# Patient Record
Sex: Female | Born: 1970 | Race: White | Hispanic: No | Marital: Married | Smoking: Former smoker
Health system: Southern US, Community
[De-identification: ages and names within clinical notes are randomized; demographics above are authoritative.]

## PROBLEM LIST (undated history)

## (undated) DIAGNOSIS — Z87442 Personal history of urinary calculi: Secondary | ICD-10-CM

## (undated) DIAGNOSIS — I10 Essential (primary) hypertension: Secondary | ICD-10-CM

## (undated) DIAGNOSIS — N840 Polyp of corpus uteri: Secondary | ICD-10-CM

## (undated) DIAGNOSIS — N189 Chronic kidney disease, unspecified: Secondary | ICD-10-CM

## (undated) DIAGNOSIS — Z86018 Personal history of other benign neoplasm: Secondary | ICD-10-CM

## (undated) DIAGNOSIS — R319 Hematuria, unspecified: Secondary | ICD-10-CM

## (undated) DIAGNOSIS — K219 Gastro-esophageal reflux disease without esophagitis: Secondary | ICD-10-CM

## (undated) DIAGNOSIS — D649 Anemia, unspecified: Secondary | ICD-10-CM

## (undated) DIAGNOSIS — Z9289 Personal history of other medical treatment: Secondary | ICD-10-CM

## (undated) HISTORY — DX: Personal history of other benign neoplasm: Z86.018

## (undated) HISTORY — DX: Personal history of other medical treatment: Z92.89

## (undated) HISTORY — DX: Essential (primary) hypertension: I10

## (undated) HISTORY — DX: Hematuria, unspecified: R31.9

## (undated) HISTORY — PX: ABDOMINAL HYSTERECTOMY: SHX81

## (undated) HISTORY — PX: KIDNEY STONE SURGERY: SHX686

## (undated) HISTORY — DX: Polyp of corpus uteri: N84.0

---

## 2011-12-11 LAB — HM PAP SMEAR

## 2013-05-12 ENCOUNTER — Ambulatory Visit: Payer: Self-pay | Admitting: Family Medicine

## 2013-08-12 ENCOUNTER — Telehealth: Payer: Self-pay | Admitting: Diagnostic Neuroimaging

## 2013-08-12 NOTE — Telephone Encounter (Signed)
Please have her complete the studies and then come in.-LL

## 2013-08-12 NOTE — Telephone Encounter (Signed)
I called to get an update on patient's present symptoms. Patient reports present symptoms constant tingling and weakness of right fingers, hand, arm. She also has a sensation like she has held her arms over her head for a long time and blood has drained from hands, wrists and up arms. Finally, she has increased weakness.  She would like to have EMG/NCS. Do you want to see this patient first or have he complete the studies that were recommended before and then come in? Please advise.

## 2013-08-13 ENCOUNTER — Other Ambulatory Visit: Payer: Self-pay | Admitting: Nurse Practitioner

## 2013-08-13 DIAGNOSIS — R29898 Other symptoms and signs involving the musculoskeletal system: Secondary | ICD-10-CM

## 2013-08-13 DIAGNOSIS — R200 Anesthesia of skin: Secondary | ICD-10-CM

## 2013-08-13 NOTE — Telephone Encounter (Signed)
I entered order for NCV/EMG. Thanks, LL

## 2013-08-13 NOTE — Telephone Encounter (Signed)
Please, reorder EMG/NCS.  Thank you.

## 2013-08-13 NOTE — Telephone Encounter (Signed)
I called patient and let her know we will have her do the EMG/NCS and then see Heide Guile, NP. We scheduled follow up appointment. Patient will hear from scheduler to set up studies. Should she not hear in the next week, please call back and I will speak with her directly.

## 2013-09-07 ENCOUNTER — Encounter (INDEPENDENT_AMBULATORY_CARE_PROVIDER_SITE_OTHER): Payer: Self-pay

## 2013-09-07 ENCOUNTER — Emergency Department: Payer: Self-pay | Admitting: Emergency Medicine

## 2013-09-07 ENCOUNTER — Ambulatory Visit (INDEPENDENT_AMBULATORY_CARE_PROVIDER_SITE_OTHER): Payer: BC Managed Care – PPO | Admitting: Diagnostic Neuroimaging

## 2013-09-07 DIAGNOSIS — R209 Unspecified disturbances of skin sensation: Secondary | ICD-10-CM

## 2013-09-07 DIAGNOSIS — R200 Anesthesia of skin: Secondary | ICD-10-CM

## 2013-09-07 DIAGNOSIS — Z0289 Encounter for other administrative examinations: Secondary | ICD-10-CM

## 2013-09-07 DIAGNOSIS — M792 Neuralgia and neuritis, unspecified: Secondary | ICD-10-CM

## 2013-09-07 DIAGNOSIS — R29898 Other symptoms and signs involving the musculoskeletal system: Secondary | ICD-10-CM

## 2013-09-07 LAB — COMPREHENSIVE METABOLIC PANEL
Alkaline Phosphatase: 69 U/L (ref 50–136)
Anion Gap: 6 — ABNORMAL LOW (ref 7–16)
BUN: 25 mg/dL — ABNORMAL HIGH (ref 7–18)
Bilirubin,Total: 0.3 mg/dL (ref 0.2–1.0)
Calcium, Total: 9.1 mg/dL (ref 8.5–10.1)
Co2: 26 mmol/L (ref 21–32)
Creatinine: 0.94 mg/dL (ref 0.60–1.30)
EGFR (Non-African Amer.): >60
Glucose: 95 mg/dL (ref 65–99)
Osmolality: 282 (ref 275–301)
Potassium: 4.1 mmol/L (ref 3.5–5.1)
SGOT(AST): 23 U/L (ref 15–37)
Sodium: 139 mmol/L (ref 136–145)

## 2013-09-07 LAB — CBC
MCHC: 33.8 g/dL (ref 32.0–36.0)
Platelet: 266 10*3/uL (ref 150–440)
RDW: 13.2 % (ref 11.5–14.5)
WBC: 8.3 10*3/uL (ref 3.6–11.0)

## 2013-09-07 LAB — CK TOTAL AND CKMB (NOT AT ARMC): CK, Total: 74 U/L (ref 21–215)

## 2013-09-07 NOTE — Procedures (Signed)
   [facility]  NCS (NERVE CONDUCTION STUDY) WITH EMG (ELECTROMYOGRAPHY) REPORT   STUDY DATE: 09/07/13 PATIENT NAME: Sheryl Carr DOB: 09-Sep-1971 MRN: [id number]  ORDERING CLINICIAN: Joycelyn Schmid, MD   TECHNOLOGIST: Gearldine Shown ELECTROMYOGRAPHER: Glenford Bayley. Cashawn Yanko, MD  CLINICAL INFORMATION: 42 year old female with right arm pain and numbness.  FINDINGS: NERVE CONDUCTION STUDY: Bilateral median and ulnar motor responses have normal distal latencies, amplitudes, conduction velocities and F-wave latencies. Bilateral median and ulnar sensory responses are normal.  NEEDLE ELECTROMYOGRAPHY: Needle examination of selected muscles of the right upper extremity (deltoid, biceps, triceps, flexor carpi radialis, first dorsal interosseous) and right C6-7 and right C7-T1 paraspinal muscles is normal. No abnormal spontaneous activity at rest and normal motor unit recruitment on exertion.  IMPRESSION:  This is a normal study. No electrodiagnostic evidence of large fiber neuropathy or right cervical radiculopathy at this time.   INTERPRETING PHYSICIAN:  Suanne Marker, MD Certified in Neurology, Neurophysiology and Neuroimaging  [facility] [address], [address] [place], [place] [address] [phone]

## 2013-09-09 NOTE — Progress Notes (Signed)
Quick Note:  Spoke to patient and relayed normal NCV/EMG, per Larita Fife. ______

## 2013-09-24 ENCOUNTER — Encounter: Payer: Self-pay | Admitting: Nurse Practitioner

## 2013-09-24 DIAGNOSIS — R200 Anesthesia of skin: Secondary | ICD-10-CM | POA: Insufficient documentation

## 2013-09-24 DIAGNOSIS — M79609 Pain in unspecified limb: Secondary | ICD-10-CM | POA: Insufficient documentation

## 2013-10-02 ENCOUNTER — Ambulatory Visit: Payer: Self-pay | Admitting: Nurse Practitioner

## 2013-12-28 ENCOUNTER — Ambulatory Visit: Payer: Self-pay | Admitting: Nurse Practitioner

## 2014-01-07 ENCOUNTER — Other Ambulatory Visit: Payer: BC Managed Care – PPO

## 2014-01-20 ENCOUNTER — Ambulatory Visit: Payer: Self-pay | Admitting: Nurse Practitioner

## 2014-02-10 ENCOUNTER — Ambulatory Visit (INDEPENDENT_AMBULATORY_CARE_PROVIDER_SITE_OTHER): Payer: No Typology Code available for payment source

## 2014-02-10 ENCOUNTER — Encounter (INDEPENDENT_AMBULATORY_CARE_PROVIDER_SITE_OTHER): Payer: Self-pay

## 2014-02-10 DIAGNOSIS — R202 Paresthesia of skin: Principal | ICD-10-CM

## 2014-02-10 DIAGNOSIS — R209 Unspecified disturbances of skin sensation: Secondary | ICD-10-CM

## 2014-02-10 DIAGNOSIS — R200 Anesthesia of skin: Secondary | ICD-10-CM

## 2014-02-10 DIAGNOSIS — IMO0002 Reserved for concepts with insufficient information to code with codable children: Secondary | ICD-10-CM

## 2014-02-10 DIAGNOSIS — M792 Neuralgia and neuritis, unspecified: Secondary | ICD-10-CM

## 2014-02-10 MED ORDER — GADOPENTETATE DIMEGLUMINE 469.01 MG/ML IV SOLN: 13.0000 mL | Freq: Once | INTRAVENOUS | Status: AC | PRN

## 2014-02-17 ENCOUNTER — Encounter: Payer: Self-pay | Admitting: Nurse Practitioner

## 2014-02-17 ENCOUNTER — Ambulatory Visit (INDEPENDENT_AMBULATORY_CARE_PROVIDER_SITE_OTHER): Payer: No Typology Code available for payment source | Admitting: Nurse Practitioner

## 2014-02-17 VITALS — BP 114/88 | HR 68 | Ht 68.0 in | Wt 142.0 lb

## 2014-02-17 DIAGNOSIS — IMO0002 Reserved for concepts with insufficient information to code with codable children: Secondary | ICD-10-CM

## 2014-02-17 DIAGNOSIS — R29898 Other symptoms and signs involving the musculoskeletal system: Secondary | ICD-10-CM

## 2014-02-17 DIAGNOSIS — M792 Neuralgia and neuritis, unspecified: Secondary | ICD-10-CM

## 2014-02-17 DIAGNOSIS — M79609 Pain in unspecified limb: Secondary | ICD-10-CM

## 2014-02-17 DIAGNOSIS — R202 Paresthesia of skin: Secondary | ICD-10-CM

## 2014-02-17 DIAGNOSIS — R200 Anesthesia of skin: Secondary | ICD-10-CM

## 2014-02-17 DIAGNOSIS — R209 Unspecified disturbances of skin sensation: Secondary | ICD-10-CM

## 2014-02-17 NOTE — Progress Notes (Signed)
PATIENT: Sheryl Carr DOB: November 22, 1971  REASON FOR VISIT: follow up for numbness in right hand, pain in wrist HISTORY FROM: patient  HISTORY OF PRESENT ILLNESS: Complaint: Right hand numbness, pain   HPI: 43 year old right-handed female with hypertension, here for evaluation of right hand numbness and pain.  For past 15 years, patient has had intermittent right hand, index and middle finger, numbness and pain. She feels some sharp shooting pains in between her MCP joints. Symptoms aggravated by certain activities such as lifting her arms overhead, using a broom, sleeping with her hand closed fist. She was evaluated 12 years ago by rheumatology, possibly EMG nerve conduction study, and also vascular studies. Apparently all these testing were unremarkable at that time. She tried wrist splints which seem to aggravate her symptoms. Now symptoms progressing. sometimes dropping things.  02/17/14 (LL):  Patient returns for test results.  Symptoms have been constant.  MRI brain shows small areas of non-specific gliosis, MRI cervical spine normal and NCV/EMG normal.  Review of Systems  Out of a complete 14 system review, the patient complains of only the following symptoms, and all other reviewed systems are negative.  Neurological: Numbness   Weakness     Moles   Musculoskeletal: Joint pain, back pain  Allergy/Immunology: Allergies      ALLERGIES: Allergies  Allergen Reactions  . Amoxicillin     HOME MEDICATIONS: Outpatient Prescriptions Prior to Visit  Medication Sig Dispense Refill  . lisinopril (PRINIVIL,ZESTRIL) 5 MG tablet Take 5 mg by mouth daily.      Marland Kitchen azithromycin (ZITHROMAX) 250 MG tablet Take 250 mg by mouth.       No facility-administered medications prior to visit.     PHYSICAL EXAM  Filed Vitals:   02/17/14 1122  BP: 114/88  Pulse: 68  Height: 5\' 8"  (1.727 m)  Weight: 142 lb (64.411 kg)   Body mass index is 21.6 kg/(m^2).  Physical Exam  General: Patient  is awake, alert and in no acute distress.  Well developed and groomed. Musculoskeletal: POSITIVE PHALENS AND TINELS IN RIGHT HAND/INDEX FINGER.  Neurologic Exam  Mental Status: Awake, alert. Language is fluent and comprehension intact. Cranial Nerves:  Pupils are equal and reactive to light.  Visual fields are full to confrontation.  Conjugate eye movements are full and symmetric.  Facial sensation and strength are symmetric.  Hearing is intact.   Motor: Normal bulk and tone.  Full strength in the upper and lower extremities.   Sensory: Intact and symmetric to light touch, pinprick, temperature, vibration; EXCEPT DECR PP IN RIGHT HAND, DIGITS 2, 3.  Coordination: No ataxia or dysmetria on finger-nose or rapid alternating movement testing. Gait and Station: Narrow based gait. Reflexes: Deep tendon reflexes in the upper and lower extremity are present and symmetric.    ASSESSMENT AND PLAN 43 y.o. year old female  has a past medical history of HTN (hypertension) here with pain and numbness in right wrist and digits 2, 3.  MRI brain shows small areas of non-specific gliosis, MRI cervical spine normal and NCV/EMG normal.  Likely mild carpel tunnel syndrome.  Ddx: carpal tunnel syndrome vs. musculoskeletal  PLAN: 1. Recommend wrist splint at night to right wrist. 2. Use ergonomic wrist supports/aid while typing/work. 3. If condition worsens, referral to ortho/hand specialist 4. Follow up here as needed.  Philmore Pali, MSN, NP-C 02/17/2014, 11:44 AM [facility] [address] [place], [place] [address] [phone]  Note: This document was prepared with  digital dictation and possible smart Company secretary. Any transcriptional errors that result from this process are unintentional.

## 2014-02-19 NOTE — Progress Notes (Signed)
I reviewed note and agree with plan.   Penni Bombard, MD 4/48/1856, 3:14 PM Certified in Neurology, Neurophysiology and Neuroimaging  [facility] [address] [place], [place] [address] [phone]

## 2014-05-19 ENCOUNTER — Ambulatory Visit: Payer: Self-pay | Admitting: Family Medicine

## 2014-12-10 DIAGNOSIS — N840 Polyp of corpus uteri: Secondary | ICD-10-CM

## 2014-12-10 HISTORY — DX: Polyp of corpus uteri: N84.0

## 2015-03-15 ENCOUNTER — Ambulatory Visit
Admit: 2015-03-15 | Disposition: A | Discharge status: Home / Self Care | Payer: Self-pay | Attending: Family Medicine | Admitting: Family Medicine

## 2015-03-15 LAB — HM MAMMOGRAPHY

## 2015-05-11 DIAGNOSIS — Z9289 Personal history of other medical treatment: Secondary | ICD-10-CM

## 2015-05-11 HISTORY — DX: Personal history of other medical treatment: Z92.89

## 2015-05-23 ENCOUNTER — Encounter: Payer: Self-pay | Admitting: Family Medicine

## 2015-05-23 ENCOUNTER — Ambulatory Visit (INDEPENDENT_AMBULATORY_CARE_PROVIDER_SITE_OTHER): Payer: No Typology Code available for payment source | Admitting: Family Medicine

## 2015-05-23 VITALS — BP 133/87 | HR 81 | Temp 98.3°F | Resp 16 | Ht 68.0 in | Wt 148.6 lb

## 2015-05-23 DIAGNOSIS — J020 Streptococcal pharyngitis: Secondary | ICD-10-CM

## 2015-05-23 DIAGNOSIS — E7800 Pure hypercholesterolemia, unspecified: Secondary | ICD-10-CM | POA: Insufficient documentation

## 2015-05-23 DIAGNOSIS — L989 Disorder of the skin and subcutaneous tissue, unspecified: Secondary | ICD-10-CM | POA: Insufficient documentation

## 2015-05-23 DIAGNOSIS — L239 Allergic contact dermatitis, unspecified cause: Secondary | ICD-10-CM | POA: Insufficient documentation

## 2015-05-23 DIAGNOSIS — R109 Unspecified abdominal pain: Secondary | ICD-10-CM | POA: Insufficient documentation

## 2015-05-23 DIAGNOSIS — F419 Anxiety disorder, unspecified: Secondary | ICD-10-CM | POA: Insufficient documentation

## 2015-05-23 DIAGNOSIS — G629 Polyneuropathy, unspecified: Secondary | ICD-10-CM | POA: Insufficient documentation

## 2015-05-23 DIAGNOSIS — IMO0001 Reserved for inherently not codable concepts without codable children: Secondary | ICD-10-CM | POA: Insufficient documentation

## 2015-05-23 DIAGNOSIS — R5383 Other fatigue: Secondary | ICD-10-CM | POA: Insufficient documentation

## 2015-05-23 DIAGNOSIS — J029 Acute pharyngitis, unspecified: Secondary | ICD-10-CM

## 2015-05-23 DIAGNOSIS — M62838 Other muscle spasm: Secondary | ICD-10-CM | POA: Insufficient documentation

## 2015-05-23 DIAGNOSIS — N63 Unspecified lump in unspecified breast: Secondary | ICD-10-CM | POA: Insufficient documentation

## 2015-05-23 LAB — POCT RAPID STREP A (OFFICE): RAPID STREP A SCREEN: POSITIVE — AB

## 2015-05-23 MED ORDER — CEPHALEXIN 500 MG PO CAPS
500.0000 mg | ORAL_CAPSULE | Freq: Four times a day (QID) | ORAL | Status: DC
Start: 2015-05-23 — End: 2015-08-03

## 2015-05-23 NOTE — Patient Instructions (Signed)
Gargle with mild saline gargles 3-4 times a day.  May take Tylenol or Ibuprofen for aches/fever.

## 2015-05-23 NOTE — Progress Notes (Signed)
Name: Sheryl Carr   MRN: [id number]    DOB: [birth year]   Date:05/23/2015       Progress Note  Subjective  Chief Complaint  Chief Complaint  Patient presents with  . Sore Throat    4 days    white pocket in throat    HPI  Sore throat x4 days.  Cpnsistent  No fever], but has had some chlls and  Aches and malaise.  No cough. Mild congestion  Past Medical History  Diagnosis Date  . HTN (hypertension)     History  Substance Use Topics  . Smoking status: Former Smoker    Quit date: 09/24/1990  . Smokeless tobacco: Never Used  . Alcohol Use: Yes     Comment: social     Current outpatient prescriptions:  .  cephALEXin (KEFLEX) 500 MG capsule, Take 1 capsule (500 mg total) by mouth 4 (four) times daily., Disp: 40 capsule, Rfl: 0 .  ibuprofen (ADVIL,MOTRIN) 200 MG tablet, Take by mouth., Disp: , Rfl:  .  lisinopril (PRINIVIL,ZESTRIL) 5 MG tablet, Take 5 mg by mouth daily., Disp: , Rfl:   Allergies  Allergen Reactions  . Amoxicillin   . Pollen Extract     itchy eyes, dull headaches, runny nose  . Penicillins Rash    Review of Systems  Constitutional: Positive for chills and malaise/fatigue. Negative for fever.  HENT: Positive for sore throat.   Respiratory: Negative.  Negative for cough, sputum production and wheezing.   Cardiovascular: Negative.  Negative for chest pain, orthopnea and leg swelling.  Gastrointestinal: Negative.  Negative for nausea, abdominal pain and diarrhea.  Skin: Negative for rash.  Neurological: Positive for weakness. Negative for headaches.     Objective  Filed Vitals:   05/23/15 1314  BP: 133/87  Pulse: 81  Temp: 98.3 F (36.8 C)  Resp: 16  Height: 5\' 8"  (1.727 m)  Weight: 148 lb 9.6 oz (67.405 kg)     Physical Exam  Constitutional: She appears distressed (mild).  HENT:  Right Ear: External ear and ear canal normal.  Left Ear: External ear and ear canal normal.  Nose: Nose normal.  Mouth/Throat: Posterior oropharyngeal  erythema (bright red post-pharynx esp around tonsils.) present.  Neck: Normal range of motion. Neck supple. No thyromegaly present.  Cardiovascular: Normal rate, regular rhythm, normal heart sounds and intact distal pulses.  Exam reveals no gallop and no friction rub.   No murmur heard. Pulmonary/Chest: Effort normal and breath sounds normal.  Abdominal: Soft. Bowel sounds are normal. There is no tenderness.  Lymphadenopathy:    She has cervical adenopathy.       Right cervical: No superficial cervical adenopathy present.      Left cervical: Superficial cervical adenopathy present.  Vitals reviewed.     Recent Results (from the past 2160 hour(s))  [facility] MAMMOGRAPHY     Status: None   Collection Time: 03/15/15 12:00 AM  Result Value Ref Range   [facility] Mammogram WNL   POCT rapid strep A     Status: Abnormal   Collection Time: 05/23/15  1:31 PM  Result Value Ref Range   Rapid Strep A Screen Positive (A) Negative     Assessment & Plan  1. Sore throat  - POCT rapid strep A- Positive- cephALEXin (KEFLEX) 500 MG capsule; Take 1 capsule (500 mg total) by mouth 4 (four) times daily.  Dispense: 40 capsule; Refill: 0

## 2015-06-28 ENCOUNTER — Encounter: Payer: Self-pay | Admitting: Family Medicine

## 2015-07-02 ENCOUNTER — Encounter: Payer: Self-pay | Admitting: Emergency Medicine

## 2015-07-02 DIAGNOSIS — Z87891 Personal history of nicotine dependence: Secondary | ICD-10-CM | POA: Diagnosis not present

## 2015-07-02 DIAGNOSIS — R531 Weakness: Secondary | ICD-10-CM | POA: Insufficient documentation

## 2015-07-02 DIAGNOSIS — R5383 Other fatigue: Secondary | ICD-10-CM | POA: Diagnosis present

## 2015-07-02 DIAGNOSIS — Z79899 Other long term (current) drug therapy: Secondary | ICD-10-CM | POA: Diagnosis not present

## 2015-07-02 DIAGNOSIS — Z3202 Encounter for pregnancy test, result negative: Secondary | ICD-10-CM | POA: Insufficient documentation

## 2015-07-02 DIAGNOSIS — I10 Essential (primary) hypertension: Secondary | ICD-10-CM | POA: Diagnosis not present

## 2015-07-02 DIAGNOSIS — Z880 Allergy status to penicillin: Secondary | ICD-10-CM | POA: Diagnosis not present

## 2015-07-02 NOTE — ED Notes (Signed)
Pt presents to ER alert and in NAD. Pt states she was outside for 15 minutes today and has felt fatigued since. Pt alert and ambulatory, clear speech.

## 2015-07-03 ENCOUNTER — Emergency Department
Admission: EM | Admit: 2015-07-03 | Discharge: 2015-07-03 | Disposition: A | Discharge status: Home / Self Care | Payer: No Typology Code available for payment source | Attending: Emergency Medicine | Admitting: Emergency Medicine

## 2015-07-03 DIAGNOSIS — R531 Weakness: Secondary | ICD-10-CM

## 2015-07-03 LAB — BASIC METABOLIC PANEL
Anion gap: 6 (ref 5–15)
BUN: 13 mg/dL (ref 6–20)
CALCIUM: 9.6 mg/dL (ref 8.9–10.3)
CO2: 30 mmol/L (ref 22–32)
Chloride: 102 mmol/L (ref 101–111)
Creatinine, Ser: 0.72 mg/dL (ref 0.44–1.00)
GFR calc Af Amer: >60 mL/min (ref >60)
GFR calc non Af Amer: >60 mL/min (ref >60)
GLUCOSE: 97 mg/dL (ref 65–99)
Potassium: 3.7 mmol/L (ref 3.5–5.1)
SODIUM: 138 mmol/L (ref 135–145)

## 2015-07-03 LAB — URINALYSIS COMPLETE WITH MICROSCOPIC (ARMC ONLY)
Bacteria, UA: NONE SEEN
Bilirubin Urine: NEGATIVE
Glucose, UA: NEGATIVE mg/dL
KETONES UR: NEGATIVE mg/dL
Leukocytes, UA: NEGATIVE
Nitrite: NEGATIVE
PH: 8 (ref 5.0–8.0)
PROTEIN: NEGATIVE mg/dL
Specific Gravity, Urine: 1.008 (ref 1.005–1.030)
WBC, UA: NONE SEEN WBC/hpf (ref 0–5)

## 2015-07-03 LAB — CBC
HCT: 32 % — ABNORMAL LOW (ref 35.0–47.0)
Hemoglobin: 9.8 g/dL — ABNORMAL LOW (ref 12.0–16.0)
MCH: 20.8 pg — AB (ref 26.0–34.0)
MCHC: 30.5 g/dL — AB (ref 32.0–36.0)
MCV: 68.2 fL — ABNORMAL LOW (ref 80.0–100.0)
Platelets: 371 10*3/uL (ref 150–440)
RBC: 4.69 MIL/uL (ref 3.80–5.20)
RDW: 16.4 % — ABNORMAL HIGH (ref 11.5–14.5)
WBC: 5.7 10*3/uL (ref 3.6–11.0)

## 2015-07-03 LAB — PREGNANCY, URINE: Preg Test, Ur: NEGATIVE

## 2015-07-03 NOTE — ED Notes (Signed)
Pt. Going home with family. 

## 2015-07-03 NOTE — Discharge Instructions (Signed)

## 2015-07-03 NOTE — ED Provider Notes (Signed)
[facility] Emergency Department Provider Note  Time seen: 2:00 AM  I have reviewed the triage vital signs and the nursing notes.   HISTORY  Chief Complaint Fatigue    HPI Sheryl Carr is a 44 y.o. female with a past medical history of hypertension who presents the emergency department with fatigue. According to the patient she was outside for approximately 30 minutes a day in the heat. She states she became very hot so she went inside. She took a cool shower to cool down, but ever since then she has felt extremely tired. Denies any headache, focal weakness or numbness. Denies abdominal pain, chest pain or shortness of breath. States the fatigue has continued all day so she came into the emergency department for evaluation.     Past Medical History  Diagnosis Date  . HTN (hypertension)     Patient Active Problem List   Diagnosis Date Noted  . Abdominal pain 05/23/2015  . Anxiety 05/23/2015  . Allergic contact dermatitis 05/23/2015  . Elevated LDL cholesterol level 05/23/2015  . Essential (primary) hypertension 05/23/2015  . Fatigue 05/23/2015  . Brash 05/23/2015  . Breast lump 05/23/2015  . Muscle spasms of neck 05/23/2015  . Neuropathy 05/23/2015  . Pharyngeal inflammation 05/23/2015  . Skin lesion 05/23/2015  . Strep pharyngitis 05/23/2015  . Pain in limb 09/24/2013  . Numbness 09/24/2013    Past Surgical History  Procedure Laterality Date  . None    . Cesarean section      Current Outpatient Rx  Name  Route  Sig  Dispense  Refill  . cephALEXin (KEFLEX) 500 MG capsule   Oral   Take 1 capsule (500 mg total) by mouth 4 (four) times daily.   40 capsule   0     Patient had rash to penicillin.  Has never taken C ...   . ibuprofen (ADVIL,MOTRIN) 200 MG tablet   Oral   Take by mouth.         Marland Kitchen lisinopril (PRINIVIL,ZESTRIL) 5 MG tablet   Oral   Take 5 mg by mouth daily.           Allergies Amoxicillin; Pollen extract;  and Penicillins  Family History  Problem Relation Age of Onset  . Diabetes Mellitus I Mother   . Diabetes Mellitus I Maternal Grandmother   . Prostate cancer Maternal Grandfather   . Diabetes Mellitus I Paternal Grandmother     Social History History  Substance Use Topics  . Smoking status: Former Smoker    Quit date: 09/24/1990  . Smokeless tobacco: Never Used  . Alcohol Use: Yes     Comment: social    Review of Systems Constitutional: Negative for fever Cardiovascular: Negative for chest pain. Respiratory: Negative for shortness of breath. Gastrointestinal: Negative for abdominal pain Genitourinary: Negative for dysuria. Neurological: Negative for headaches, focal weakness or numbness. 10-point ROS otherwise negative.  ____________________________________________   PHYSICAL EXAM:  VITAL SIGNS: ED Triage Vitals  Enc Vitals Group     BP 07/02/15 2355 185/115 mmHg     Pulse Rate 07/02/15 2354 84     Resp 07/02/15 2354 20     Temp 07/02/15 2354 98.1 F (36.7 C)     Temp Source 07/02/15 2354 Oral     SpO2 07/02/15 2354 100 %     Weight 07/02/15 2354 145 lb (65.772 kg)     Height 07/02/15 2354 5\' 8"  (1.727 m)     Head Cir --  Peak Flow --      Pain Score --      Pain Loc --      Pain Edu? --      Excl. in [place]? --     Constitutional: Alert and oriented. Well appearing and in no distress. Eyes: Normal exam, 2-3 mm PERRL bilaterally ENT   Head: Normocephalic and atraumatic.   Nose: No congestion/rhinnorhea.   Mouth/Throat: Mucous membranes are moist. Cardiovascular: Normal rate, regular rhythm. No murmur Respiratory: Normal respiratory effort without tachypnea nor retractions. Breath sounds are clear  Gastrointestinal: Soft and nontender. No distention.   Musculoskeletal: Nontender with normal range of motion in all extremities.  Neurologic:  Normal speech and language. No gross focal neurologic deficits are appreciated. Speech is normal. No  pronator drift, equal motor in all extremities 5/5. Skin:  Skin is warm, dry and intact.  Psychiatric: Mood and affect are normal. Speech and behavior are normal.   ____________________________________________    EKG  EKG reviewed and interpreted by myself shows normal sinus rhythm at 81 bpm, narrow QRS, normal axis, normal intervals, no ST changes noted. Overall normal EKG.  ____________________________________________     INITIAL IMPRESSION / ASSESSMENT AND PLAN / ED COURSE  Pertinent labs & imaging results that were available during my care of the patient were reviewed by me and considered in my medical decision making (see chart for details).  Patient presents the emergency department with fatigue. Patient's lab work appears largely within normal limits including a negative urinalysis. Patient's EKG appears well. Patient states this all began after being outside in the heat. I discussed with the patient increasing her fluids at home, obtaining plenty of rest, and following up with her primary care doctor on Monday if she continues to have any symptoms. I discussed with the patient return precautions including if she develops any chest pain, trouble breathing, or worsening fatigue. The patient is agreeable.  ____________________________________________   FINAL CLINICAL IMPRESSION(S) / ED DIAGNOSES  Generalized weakness   Harvest Dark, MD 07/03/15 [phone]

## 2015-07-03 NOTE — ED Notes (Signed)
Pt. States after 10 am yesterday felt nausea, fatgue and weakness.  Pt. Denies anyone in house with same symptoms.  Pt. Denies starting any new medications.  Pt. Denies any cold or flu like symptoms.  Pt. Denies any new stressors.

## 2015-08-03 ENCOUNTER — Ambulatory Visit (INDEPENDENT_AMBULATORY_CARE_PROVIDER_SITE_OTHER): Payer: No Typology Code available for payment source | Admitting: Family Medicine

## 2015-08-03 ENCOUNTER — Encounter: Payer: Self-pay | Admitting: Family Medicine

## 2015-08-03 ENCOUNTER — Other Ambulatory Visit: Payer: Self-pay

## 2015-08-03 VITALS — BP 143/83 | HR 71 | Temp 97.1°F | Resp 16 | Ht 67.0 in | Wt 147.0 lb

## 2015-08-03 DIAGNOSIS — Q519 Congenital malformation of uterus and cervix, unspecified: Secondary | ICD-10-CM

## 2015-08-03 DIAGNOSIS — Z0000 Encounter for general adult medical examination without abnormal findings: Secondary | ICD-10-CM

## 2015-08-03 DIAGNOSIS — R102 Pelvic and perineal pain: Secondary | ICD-10-CM

## 2015-08-03 DIAGNOSIS — K219 Gastro-esophageal reflux disease without esophagitis: Secondary | ICD-10-CM | POA: Diagnosis not present

## 2015-08-03 MED ORDER — OMEPRAZOLE 20 MG PO CPDR: 20.0000 mg | DELAYED_RELEASE_CAPSULE | Freq: Every day | ORAL | Status: DC

## 2015-08-03 NOTE — Patient Instructions (Signed)
CBC, CMP and Lipid panels were ordered and order sheet given to patient.    Pelvic Ultrasound will be ordered at [facility]  For evaluation of patient's periodic pelvic pain and abnormal uterine anatomy.  Patient will contact [facility] to arrange convenient sxcreening mammogram time.

## 2015-08-03 NOTE — Progress Notes (Signed)
Name: Brandon Scarbrough   MRN: [id number]    DOB: 11/10/1971   Date:08/03/2015       Progress Note  Subjective  Chief Complaint  Chief Complaint  Patient presents with  . Annual Exam    HPI  Here for annual female physical exam.  Last full mammogram screen was 2014.  Last Pap was 2014.  Has never had an abnormal pap.  Had a questionable  mass in R. Breast earlier this spring.  Mammogram and [place] were neg.  She can no longer feel the mass now.  Regular screening mammogram recommended for this summer.  Pt has hx of GERD.  Refluxes acid several days each week.  Has severe reflux when she drinks caffeine so has reduced this .  This has helped some, but still with some several days each week.  Has episodic several pelvic cramping that is relieved by urinating.  There is no pattern with this.  Not daily. No interference with sex.  No other UTI sx.  Urine cultures have been neg. No problem-specific assessment & plan notes found for this encounter.   Past Medical History  Diagnosis Date  . HTN (hypertension)   . H/O mammogram 05/11/2015    Past Surgical History  Procedure Laterality Date  . None    . Cesarean section      Family History  Problem Relation Age of Onset  . Diabetes Mellitus I Mother   . Diabetes Mellitus I Maternal Grandmother   . Prostate cancer Maternal Grandfather   . Diabetes Mellitus I Paternal Grandmother     Social History   Social History  . Marital Status: Married    Spouse Name: [name]  . Number of Children: 2  . Years of Education: college   Occupational History  . n/a   .  [place] History Main Topics  . Smoking status: Former Smoker    Quit date: 09/24/1990  . Smokeless tobacco: Never Used  . Alcohol Use: Yes     Comment: social  . Drug Use: No  . Sexual Activity: Yes    Birth Control/ Protection: Injection   Other Topics Concern  . Not on file   Social History Narrative   Patient is married [name]) and lives at home with  her husband and one child, one child is in college.   Patient is working full-time.   Patient has a college education.   Patient is right-handed.   Patient drinks 1-2 cups of coffee daily, soda and tea are occasionally, not daily.     Current outpatient prescriptions:  .  omeprazole (PRILOSEC) 20 MG capsule, Take 1 capsule (20 mg total) by mouth daily., Disp: 30 capsule, Rfl: 12  Allergies  Allergen Reactions  . Amoxicillin   . Pollen Extract     itchy eyes, dull headaches, runny nose  . Penicillins Rash     Review of Systems  Constitutional: Negative for fever, chills, weight loss and malaise/fatigue.  HENT: Negative for congestion, ear pain and hearing loss.   Eyes: Negative for blurred vision, double vision and pain.  Respiratory: Negative for cough, sputum production, shortness of breath and wheezing.   Cardiovascular: Negative for chest pain, palpitations, orthopnea, leg swelling and PND.  Gastrointestinal: Positive for heartburn and abdominal pain. Negative for nausea, vomiting, diarrhea, constipation and blood in stool.  Genitourinary: Positive for dysuria. Negative for urgency, frequency and hematuria.  Musculoskeletal: Negative for myalgias and joint pain.  Skin: Negative for itching and rash.  Neurological: Negative for dizziness, sensory change, focal weakness, weakness and headaches.  Psychiatric/Behavioral: Negative for depression. The patient is not nervous/anxious.       Objective  Filed Vitals:   08/03/15 1659  BP: 143/83  Pulse: 71  Temp: 97.1 F (36.2 C)  Resp: 16  Height: _0  (1.702 m)  Weight: 147 lb (66.679 kg)    Physical Exam  Constitutional: She is oriented to person, place, and time and well-developed, well-nourished, and in no distress. No distress.  HENT:  Head: Normocephalic and atraumatic.  Right Ear: External ear normal.  Left Ear: External ear normal.  Nose: Nose normal.  Mouth/Throat: Oropharynx is clear and moist.  Eyes: EOM  are normal. Pupils are equal, round, and reactive to light.  Fundoscopic exam:      The right eye shows no arteriolar narrowing, no AV nicking, no exudate and no hemorrhage.       The left eye shows no arteriolar narrowing, no AV nicking, no exudate and no hemorrhage.  Neck: Normal range of motion. Neck supple. Carotid bruit is not present. No thyroid mass and no thyromegaly present.  Cardiovascular: Normal rate, regular rhythm and intact distal pulses.  Exam reveals no gallop and no friction rub.   No murmur heard. Pulmonary/Chest: Effort normal and breath sounds normal. No respiratory distress. She has no wheezes. She has no rales. Right breast exhibits no inverted nipple, no mass, no nipple discharge, no skin change and no tenderness. Left breast exhibits no inverted nipple, no mass, no nipple discharge, no skin change and no tenderness. Breasts are symmetrical.  Abdominal: Soft. Bowel sounds are normal. She exhibits no distension and no mass. There is no tenderness.  Genitourinary:  Pelvic exam showed normal external genitalia.  Vag. Vault was wnl.  Uterus was extremely retroverted and relatively fixed in place with some asymetry to patient's L.  Cx located on top of uterus and difficult to visualize and hard to do adequate Pap smear.  No adnexal masses, but some tenderness to L. Adnexal area, but uterus more to this side and fixated.  Uterus itself was not tender.  Bladder was not tender.  Musculoskeletal: Normal range of motion. She exhibits no edema or tenderness.  Lymphadenopathy:    She has no cervical adenopathy.  Neurological: She is alert and oriented to person, place, and time. No cranial nerve deficit. Gait normal. Coordination normal.  Skin: Skin is warm and dry. No rash noted. No erythema. No pallor.  Psychiatric: Mood, memory, affect and judgment normal.  Vitals reviewed.      Recent Results (from the past 2160 hour(s))  POCT rapid strep A     Status: Abnormal   Collection  Time: 05/23/15  1:31 PM  Result Value Ref Range   Rapid Strep A Screen Positive (A) Negative  Basic metabolic panel     Status: None   Collection Time: 07/02/15 11:57 PM  Result Value Ref Range   Sodium 138 135 - 145 mmol/L   Potassium 3.7 3.5 - 5.1 mmol/L   Chloride 102 101 - 111 mmol/L   CO2 30 22 - 32 mmol/L   Glucose, Bld 97 65 - 99 mg/dL   BUN 13 6 - 20 mg/dL   Creatinine, Ser 0.72 0.44 - 1.00 mg/dL   Calcium 9.6 8.9 - 10.3 mg/dL   GFR calc non Af Amer >60 >60 mL/min   GFR calc Af Amer >60 >60 mL/min    Comment: (NOTE) The eGFR has been calculated using  the CKD EPI equation. This calculation has not been validated in all clinical situations. eGFR's persistently <60 mL/min signify possible Chronic Kidney Disease.    Anion gap 6 5 - 15  CBC     Status: Abnormal   Collection Time: 07/02/15 11:57 PM  Result Value Ref Range   WBC 5.7 3.6 - 11.0 K/uL   RBC 4.69 3.80 - 5.20 MIL/uL   Hemoglobin 9.8 (L) 12.0 - 16.0 g/dL   HCT 32.0 (L) 35.0 - 47.0 %   MCV 68.2 (L) 80.0 - 100.0 fL   MCH 20.8 (L) 26.0 - 34.0 pg   MCHC 30.5 (L) 32.0 - 36.0 g/dL   RDW 16.4 (H) 11.5 - 14.5 %   Platelets 371 150 - 440 K/uL  Urinalysis complete, with microscopic ([facility] only)     Status: Abnormal   Collection Time: 07/02/15 11:57 PM  Result Value Ref Range   Color, Urine STRAW (A) YELLOW   APPearance CLEAR (A) CLEAR   Glucose, UA NEGATIVE NEGATIVE mg/dL   Bilirubin Urine NEGATIVE NEGATIVE   Ketones, ur NEGATIVE NEGATIVE mg/dL   Specific Gravity, Urine 1.008 1.005 - 1.030   Hgb urine dipstick 1+ (A) NEGATIVE   pH 8.0 5.0 - 8.0   Protein, ur NEGATIVE NEGATIVE mg/dL   Nitrite NEGATIVE NEGATIVE   Leukocytes, UA NEGATIVE NEGATIVE   RBC / HPF 0-5 0 - 5 RBC/hpf   WBC, UA NONE SEEN 0 - 5 WBC/hpf   Bacteria, UA NONE SEEN NONE SEEN   Squamous Epithelial / LPF 0-5 (A) NONE SEEN   Mucous PRESENT   Pregnancy, urine     Status: None   Collection Time: 07/02/15 11:57 PM  Result Value Ref Range   Preg  Test, Ur NEGATIVE NEGATIVE     Assessment & Plan  Problem List Items Addressed This Visit      Digestive   GERD (gastroesophageal reflux disease)   Relevant Medications   omeprazole (PRILOSEC) 20 MG capsule     Other   Annual physical exam-female    Other Visit Diagnoses    Pelvic pain in female    -  Primary    Relevant Orders    US Pelvis Complete    Pap IG and HPV (high risk) DNA detection    Congenital abnormal shape of uterus           Meds ordered this encounter  Medications  . omeprazole (PRILOSEC) 20 MG capsule    Sig: Take 1 capsule (20 mg total) by mouth daily.    Dispense:  30 capsule    Refill:  12   1. Pelvic pain in female  - US Pelvis Complete; Future - Pap IG and HPV (high risk) DNA detection  2. Congenital abnormal shape of uterus   3. Annual physical exam-female   4. Gastroesophageal reflux disease without esophagitis  - omeprazole (PRILOSEC) 20 MG capsule; Take 1 capsule (20 mg total) by mouth daily.  Dispense: 30 capsule; Refill: 12

## 2015-08-04 ENCOUNTER — Other Ambulatory Visit: Payer: Self-pay | Admitting: Family Medicine

## 2015-08-04 ENCOUNTER — Telehealth: Payer: Self-pay | Admitting: *Deleted

## 2015-08-04 DIAGNOSIS — Z1231 Encounter for screening mammogram for malignant neoplasm of breast: Secondary | ICD-10-CM

## 2015-08-04 NOTE — Telephone Encounter (Signed)
Appt for [place] has been scheduled for [facility] 08/08/15. Pt to arrive at 2:45 with a full bladder for 3 Pm appt. [place] location.

## 2015-08-05 NOTE — Addendum Note (Signed)
Addended by: Theresia Majors A on: 08/05/2015 04:04 PM   Modules accepted: Miquel Dunn

## 2015-08-08 ENCOUNTER — Other Ambulatory Visit: Payer: Self-pay | Admitting: Family Medicine

## 2015-08-08 ENCOUNTER — Ambulatory Visit
Admission: RE | Admit: 2015-08-08 | Discharge: 2015-08-08 | Disposition: A | Discharge status: Home / Self Care | Payer: No Typology Code available for payment source | Source: Ambulatory Visit | Attending: Family Medicine | Admitting: Family Medicine

## 2015-08-08 DIAGNOSIS — D251 Intramural leiomyoma of uterus: Secondary | ICD-10-CM | POA: Insufficient documentation

## 2015-08-08 DIAGNOSIS — R102 Pelvic and perineal pain: Secondary | ICD-10-CM | POA: Insufficient documentation

## 2015-08-08 DIAGNOSIS — N840 Polyp of corpus uteri: Secondary | ICD-10-CM | POA: Diagnosis not present

## 2015-08-08 DIAGNOSIS — N8320 Unspecified ovarian cysts: Secondary | ICD-10-CM | POA: Diagnosis not present

## 2015-08-09 LAB — COMPREHENSIVE METABOLIC PANEL
A/G RATIO: 1.9 (ref 1.1–2.5)
ALT: 24 IU/L (ref 0–32)
AST: 18 IU/L (ref 0–40)
Albumin: 4.5 g/dL (ref 3.5–5.5)
Alkaline Phosphatase: 59 IU/L (ref 39–117)
BILIRUBIN TOTAL: 0.4 mg/dL (ref 0.0–1.2)
BUN/Creatinine Ratio: 18 (ref 9–23)
BUN: 10 mg/dL (ref 6–24)
CALCIUM: 9 mg/dL (ref 8.7–10.2)
CHLORIDE: 100 mmol/L (ref 97–108)
CO2: 24 mmol/L (ref 18–29)
Creatinine, Ser: 0.57 mg/dL (ref 0.57–1.00)
GFR, EST AFRICAN AMERICAN: 131 mL/min/{1.73_m2} (ref >59)
GFR, EST NON AFRICAN AMERICAN: 114 mL/min/{1.73_m2} (ref >59)
GLOBULIN, TOTAL: 2.4 g/dL (ref 1.5–4.5)
Glucose: 92 mg/dL (ref 65–99)
POTASSIUM: 4.2 mmol/L (ref 3.5–5.2)
SODIUM: 140 mmol/L (ref 134–144)
TOTAL PROTEIN: 6.9 g/dL (ref 6.0–8.5)

## 2015-08-09 LAB — CBC WITH DIFFERENTIAL/PLATELET
BASOS: 1 %
Basophils Absolute: 0 10*3/uL (ref 0.0–0.2)
EOS (ABSOLUTE): 0.1 10*3/uL (ref 0.0–0.4)
EOS: 2 %
HEMATOCRIT: 27.3 % — AB (ref 34.0–46.6)
Hemoglobin: 8.3 g/dL — ABNORMAL LOW (ref 11.1–15.9)
IMMATURE GRANULOCYTES: 0 %
Immature Grans (Abs): 0 10*3/uL (ref 0.0–0.1)
Lymphocytes Absolute: 1.4 10*3/uL (ref 0.7–3.1)
Lymphs: 29 %
MCH: 20.6 pg — AB (ref 26.6–33.0)
MCHC: 30.4 g/dL — ABNORMAL LOW (ref 31.5–35.7)
MCV: 68 fL — AB (ref 79–97)
Monocytes Absolute: 0.4 10*3/uL (ref 0.1–0.9)
Monocytes: 9 %
NEUTROS ABS: 3 10*3/uL (ref 1.4–7.0)
NEUTROS PCT: 59 %
PLATELETS: 375 10*3/uL (ref 150–379)
RBC: 4.02 x10E6/uL (ref 3.77–5.28)
RDW: 16.4 % — AB (ref 12.3–15.4)
WBC: 5 10*3/uL (ref 3.4–10.8)

## 2015-08-09 LAB — LIPID PANEL
CHOL/HDL RATIO: 2.7 ratio (ref 0.0–4.4)
Cholesterol, Total: 181 mg/dL (ref 100–199)
HDL: 66 mg/dL (ref >39)
LDL Calculated: 103 mg/dL — ABNORMAL HIGH (ref 0–99)
Triglycerides: 58 mg/dL (ref 0–149)
VLDL Cholesterol Cal: 12 mg/dL (ref 5–40)

## 2015-08-10 ENCOUNTER — Ambulatory Visit
Admission: RE | Admit: 2015-08-10 | Discharge: 2015-08-10 | Disposition: A | Discharge status: Home / Self Care | Payer: No Typology Code available for payment source | Source: Ambulatory Visit | Attending: Family Medicine | Admitting: Family Medicine

## 2015-08-10 DIAGNOSIS — Z1231 Encounter for screening mammogram for malignant neoplasm of breast: Secondary | ICD-10-CM | POA: Diagnosis present

## 2015-08-10 LAB — IRON AND TIBC
Iron Saturation: 3 % — CL (ref 15–55)
Iron: 13 ug/dL — ABNORMAL LOW (ref 27–159)
TIBC: 432 ug/dL (ref 250–450)
UIBC: 419 ug/dL (ref 131–425)

## 2015-08-10 LAB — FERRITIN: Ferritin: 2 ng/mL — ABNORMAL LOW (ref 15–150)

## 2015-08-11 ENCOUNTER — Telehealth: Payer: Self-pay | Admitting: *Deleted

## 2015-08-11 NOTE — Telephone Encounter (Signed)
Pt r/t call for mammogram and additional lab results. Results have been given.

## 2015-08-16 ENCOUNTER — Telehealth: Payer: Self-pay

## 2015-08-16 ENCOUNTER — Other Ambulatory Visit: Payer: Self-pay

## 2015-08-16 DIAGNOSIS — N926 Irregular menstruation, unspecified: Secondary | ICD-10-CM

## 2015-08-16 LAB — POC HEMOCCULT BLD/STL (HOME/3-CARD/SCREEN)
Card #3 Fecal Occult Blood, POC: NEGATIVE
FECAL OCCULT BLD: NEGATIVE
FECAL OCCULT BLD: NEGATIVE

## 2015-08-16 NOTE — Telephone Encounter (Signed)
Result neg x 3 for hemmocult. Advised to kep GYN and keep on Iron suppli. [facility]

## 2015-08-17 ENCOUNTER — Telehealth: Payer: Self-pay

## 2015-08-17 DIAGNOSIS — R102 Pelvic and perineal pain: Secondary | ICD-10-CM

## 2015-08-17 NOTE — Telephone Encounter (Signed)
GYN ref never entered so I put in system and will see when they call her.[facility]

## 2015-08-19 LAB — PAP IG AND HPV HIGH-RISK: PAP SMEAR COMMENT: 0

## 2015-08-19 LAB — SPECIMEN STATUS REPORT

## 2015-08-22 ENCOUNTER — Telehealth: Payer: Self-pay | Admitting: *Deleted

## 2015-08-22 NOTE — Telephone Encounter (Signed)
Pt aware of pap results and # given for [facility] for referral.[place]

## 2015-08-22 NOTE — Progress Notes (Signed)
Left message/JH 

## 2015-08-31 LAB — SPECIMEN STATUS REPORT

## 2015-09-05 ENCOUNTER — Other Ambulatory Visit: Payer: Self-pay

## 2015-09-05 DIAGNOSIS — E611 Iron deficiency: Secondary | ICD-10-CM

## 2015-09-10 LAB — IRON: IRON: 100 ug/dL (ref 27–159)

## 2015-09-27 ENCOUNTER — Encounter: Payer: No Typology Code available for payment source | Admitting: Obstetrics and Gynecology

## 2015-10-03 ENCOUNTER — Encounter: Payer: Self-pay | Admitting: Family Medicine

## 2015-10-03 ENCOUNTER — Other Ambulatory Visit: Payer: Self-pay | Admitting: Family Medicine

## 2015-10-03 ENCOUNTER — Ambulatory Visit (INDEPENDENT_AMBULATORY_CARE_PROVIDER_SITE_OTHER): Payer: No Typology Code available for payment source | Admitting: Family Medicine

## 2015-10-03 ENCOUNTER — Telehealth: Payer: Self-pay

## 2015-10-03 VITALS — BP 156/102 | HR 70 | Temp 97.9°F | Resp 16 | Ht 68.0 in | Wt 149.4 lb

## 2015-10-03 DIAGNOSIS — R319 Hematuria, unspecified: Secondary | ICD-10-CM | POA: Diagnosis not present

## 2015-10-03 DIAGNOSIS — N390 Urinary tract infection, site not specified: Secondary | ICD-10-CM | POA: Diagnosis not present

## 2015-10-03 LAB — POCT URINALYSIS DIPSTICK
Bilirubin, UA: NEGATIVE
Glucose, UA: NEGATIVE
Ketones, UA: NEGATIVE
Leukocytes, UA: NEGATIVE
NITRITE UA: NEGATIVE
PROTEIN UA: NEGATIVE
SPEC GRAV UA: 1.01
UROBILINOGEN UA: NEGATIVE
pH, UA: 5

## 2015-10-03 MED ORDER — SULFAMETHOXAZOLE-TRIMETHOPRIM 800-160 MG PO TABS: 1.0000 | ORAL_TABLET | Freq: Two times a day (BID) | ORAL | Status: DC

## 2015-10-03 NOTE — Telephone Encounter (Signed)
Pt called and has concerned about the medication that was Rx by Dr. Vicente Masson for UTI and on instruction it was mentioned that consult with provider if you are anemic and as per Dr. Vicente Masson it's okay to take Bactrim pt understood well Nisha.

## 2015-10-03 NOTE — Progress Notes (Signed)
Date:  10/03/2015   Name:  Sheryl Carr   DOB:  02-21-1971   MRN:  [id number]  PCP:  Dicky Doe, MD    Chief Complaint: Urinary Tract Infection   History of Present Illness:  This is a 44 y.o. female with 2-3 day hx dysuria and mild L back pain similar to UTI in past. Also hx kidney stones.  Review of Systems:  Review of Systems  Constitutional: Negative for fever and chills.  Gastrointestinal: Negative for abdominal pain.  Genitourinary: Negative for difficulty urinating.    Patient Active Problem List   Diagnosis Date Noted  . Annual physical exam-female 08/03/2015  . GERD (gastroesophageal reflux disease) 08/03/2015  . Abdominal pain 05/23/2015  . Anxiety 05/23/2015  . Allergic contact dermatitis 05/23/2015  . Elevated LDL cholesterol level 05/23/2015  . Essential (primary) hypertension 05/23/2015  . Fatigue 05/23/2015  . Brash 05/23/2015  . Breast lump 05/23/2015  . Muscle spasms of neck 05/23/2015  . Neuropathy ([place]) 05/23/2015  . Pharyngeal inflammation 05/23/2015  . Skin lesion 05/23/2015  . Strep pharyngitis 05/23/2015  . Pain in limb 09/24/2013  . Numbness 09/24/2013    Prior to Admission medications   Medication Sig Start Date End Date Taking? Authorizing Provider  ferrous fumarate (HEMOCYTE - 106 MG FE) 325 (106 FE) MG TABS tablet Take 1 tablet by mouth 2 (two) times daily.   Yes Historical Provider, MD  omeprazole (PRILOSEC) 20 MG capsule Take 1 capsule (20 mg total) by mouth daily. 08/03/15  Yes Arlis Porta., MD  sulfamethoxazole-trimethoprim (BACTRIM DS,SEPTRA DS) 800-160 MG tablet Take 1 tablet by mouth 2 (two) times daily. 10/03/15   Adline Potter, MD    Allergies  Allergen Reactions  . Amoxicillin   . Pollen Extract     itchy eyes, dull headaches, runny nose  . Penicillins Rash    Past Surgical History  Procedure Laterality Date  . None    . Cesarean section      Social History  Substance Use Topics  . Smoking status:  Former Smoker    Quit date: 09/24/1990  . Smokeless tobacco: Never Used  . Alcohol Use: Yes     Comment: social    Family History  Problem Relation Age of Onset  . Diabetes Mellitus I Mother   . Diabetes Mellitus I Maternal Grandmother   . Prostate cancer Maternal Grandfather   . Diabetes Mellitus I Paternal Grandmother     Medication list has been reviewed and updated.  Physical Examination: BP 156/102 mmHg  Pulse 70  Temp(Src) 97.9 F (36.6 C) (Oral)  Resp 16  Ht 5\' 8"  (1.727 m)  Wt 149 lb 6.4 oz (67.767 kg)  BMI 22.72 kg/m2  Physical Exam  Constitutional: She appears well-developed and well-nourished.  Genitourinary:  No suprapubic or CVAT  Neurological: She is alert.  Skin: Skin is warm and dry.  Psychiatric: She has a normal mood and affect. Her behavior is normal.  Nursing note and vitals reviewed.   Assessment and Plan:  1. Urinary tract infection with hematuria, site unspecified UA show large blood both otherwise negative, will send for cx and treat for UTI empirically. - POCT Urinalysis Dipstick - Urine Culture - sulfamethoxazole-trimethoprim (BACTRIM DS,SEPTRA DS) 800-160 MG tablet; Take 1 tablet by mouth 2 (two) times daily.  Dispense: 6 tablet; Refill: 0  2. Elevated BP No hx HTN, likely related to infection, monitor only   Return if symptoms worsen or fail to improve.  Satira Anis.  [facility]  10/03/2015

## 2015-10-05 LAB — URINE CULTURE

## 2015-10-19 ENCOUNTER — Encounter: Payer: No Typology Code available for payment source | Admitting: Obstetrics and Gynecology

## 2015-11-10 ENCOUNTER — Ambulatory Visit (INDEPENDENT_AMBULATORY_CARE_PROVIDER_SITE_OTHER): Payer: No Typology Code available for payment source | Admitting: Obstetrics and Gynecology

## 2015-11-10 ENCOUNTER — Encounter: Payer: Self-pay | Admitting: Obstetrics and Gynecology

## 2015-11-10 VITALS — BP 152/91 | HR 75 | Ht 68.0 in | Wt 150.0 lb

## 2015-11-10 DIAGNOSIS — I10 Essential (primary) hypertension: Secondary | ICD-10-CM | POA: Diagnosis not present

## 2015-11-10 DIAGNOSIS — N920 Excessive and frequent menstruation with regular cycle: Secondary | ICD-10-CM | POA: Diagnosis not present

## 2015-11-10 DIAGNOSIS — D251 Intramural leiomyoma of uterus: Secondary | ICD-10-CM | POA: Diagnosis not present

## 2015-11-10 DIAGNOSIS — N840 Polyp of corpus uteri: Secondary | ICD-10-CM | POA: Insufficient documentation

## 2015-11-10 NOTE — H&P (Signed)
. Subjective:    Patient is a 44 y.o. G2P2053female scheduled for Hysteroscopy D&C, Novasure embolization. Indications for procedure are Abnormal uterine bleed.   Pertinent Gynecological History: Menses: flow is excessive with use of 1 pad/hr pads or tampons on heaviest days Bleeding: dysfunctional uterine bleeding Contraception: none Last mammogram: normal Date: 07/2015 Last pap: normal Date: 07/2015  Discussed Blood/Blood Products: no   Menstrual History: OB History    Gravida Para Term Preterm AB TAB SAB Ectopic Multiple Living   2 2 2       2       Menarche age: 47  Patient's last menstrual period was 10/27/2015. Period Duration (Days): 5-6 Period Pattern: Regular Menstrual Flow: Heavy Dysmenorrhea: (!) Mild Dysmenorrhea Symptoms: Cramping  Past Medical History  Diagnosis Date  . HTN (hypertension)   . H/O mammogram 05/11/2015    Past Surgical History  Procedure Laterality Date  . None    . Cesarean section    . Kidney stone surgery      OB History  Gravida Para Term Preterm AB SAB TAB Ectopic Multiple Living  2 2 2       2     # Outcome Date GA Lbr Len/2nd Weight Sex Delivery Anes PTL Lv  2 Term           1 Term               Social History   Social History  . Marital Status: Married    Spouse Name: [name]  . Number of Children: 2  . Years of Education: college   Occupational History  . n/a   .  [place] History Main Topics  . Smoking status: Former Smoker    Quit date: 09/24/1990  . Smokeless tobacco: Never Used  . Alcohol Use: Yes     Comment: social  . Drug Use: No  . Sexual Activity: Yes    Birth Control/ Protection: None   Other Topics Concern  . None   Social History Narrative   Patient is married Therapist, occupational) and lives at home with her husband and one child, one child is in college.   Patient is working full-time.   Patient has a college education.   Patient is right-handed.   Patient drinks 1-2 cups of coffee daily,  soda and tea are occasionally, not daily.    Family History  Problem Relation Age of Onset  . Diabetes Mellitus I Mother   . Diabetes Mellitus I Maternal Grandmother   . Prostate cancer Maternal Grandfather   . Diabetes Mellitus I Paternal Grandmother     Current Outpatient Prescriptions on File Prior to Visit  Medication Sig Dispense Refill  . ferrous fumarate (HEMOCYTE - 106 MG FE) 325 (106 FE) MG TABS tablet Take 1 tablet by mouth 2 (two) times daily.    Marland Kitchen omeprazole (PRILOSEC) 20 MG capsule Take 1 capsule (20 mg total) by mouth daily. 30 capsule 12   No current facility-administered medications on file prior to visit.     Allergies  Allergen Reactions  . Amoxicillin   . Pollen Extract     itchy eyes, dull headaches, runny nose  . Penicillins Rash    Review of Systems Constitutional: No recent fever/chills/sweats Respiratory: No recent cough/bronchitis Cardiovascular: No chest pain Gastrointestinal: No recent nausea/vomiting/diarrhea Genitourinary: No UTI symptoms Hematologic/lymphatic:No history of coagulopathy or recent blood thinner use    Objective:    BP 152/91 mmHg  Pulse 75  Ht 5'  8" (1.727 m)  Wt 150 lb (68.04 kg)  BMI 22.81 kg/m2  LMP 10/27/2015  General:   Normal  Skin:   normal  HEENT:  Normal  Neck:  Supple without Adenopathy or Thyromegaly  Lungs:   Heart:              Breasts:   Abdomen:  Pelvis:  M/S   Extremeties:  Neuro:    clear to auscultation bilaterally   Normal without murmur   Not Examined   soft, non-tender; bowel sounds normal; mass present midway between pubic symphysis and umbilicus,  no organomegaly   Exam deferred to OR  No CVAT  Warm/Dry   Normal           Labs:   CBC Latest Ref Rng 08/08/2015 07/02/2015 09/07/2013  WBC 3.4 - 10.8 x10E3/uL 5.0 5.7 8.3  Hemoglobin 12.0 - 16.0 g/dL 8.3 (L) 9.8(L) 12.1  Hematocrit 34.0 - 46.6 % 27.3(L) 32.0(L) 35.8  Platelets 150 - 440 K/uL 376 371 266      Recent Labs    Lab Results  Component Value Date   IRON 100 09/09/2015   TIBC 432 08/08/2015   FERRITIN 2* 08/08/2015      08/03/2015: Pap negative   08/08/2015: Pelvic Ultrasound   FINDINGS: Uterus  Measurements: 12.5 x 9.1 x 8.1 cm. The uterus is moderately enlarged and is retroverted and retroflexed. There is a posterior fundal intramural 4.7 x 5.5 x 6.2 cm fibroid.  Endometrium  Thickness: 17 mm. The endometrium is thickened and heterogeneous, with the suggestion of 2 hyperechoic endometrial mass is measuring 0.8 cm and 0.7 cm with the suggestion of internal feeding vessels on color Doppler, likely representing endometrial polyps.  Right ovary  Measurements: 3.9 x 2.1 x 3.1 cm. There is a heterogeneously hypoechoic 1.5 x 1.1 x 1.2 cm right ovarian mass with no internal vascularity, in keeping with a hemorrhagic cyst.  Left ovary  Measurements: 3.0 x 1.9 x 3.1 cm. Normal appearance/no adnexal mass.  Other findings  Small amount of free fluid in the right adnexa.  IMPRESSION: 1. Solitary intramural 6.2 cm posterior fundal fibroid. 2. Thickened (17 mm) and heterogeneous endometrium, with the suggestion of two endometrial polyps measuring 8 mm and 7 mm. Consider correlation with saline infusion sonohysterogram and/or hysteroscopy, as clinically warranted. 3. Right ovarian 1.4 cm hemorrhagic cyst. No suspicious ovarian or adnexal findings.  Assessment:    Menorrhagia   Small ovarian cyst (on right ovary Fibroid uterus Endometrial polyp Hypertension, uncontrolled    Plan:  1) Discussion had on management options regarding endometrial polyps. Discussed that polyps are typically 95% benign, however are recommended for removal to rule out malignancy by Hysteroscopy D&C with polypectomy.  Counseling: Procedure, risks, reasons, benefits and complications (including injury to bowel, bladder, major blood vessel, ureter,  bleeding, possibility of transfusion, infection, or fistula formation) reviewed in detail. Consent signed. 2) Preop testing ordered. 3) Instructions reviewed, including NPO after midnight. 4) Lengthy discussion had on causes of abnormal uterine bleeding (i.e. Patient's fibroids, and treatment options. Discussed management options for abnormal uterine bleeding including tranexamic acid (Lysteda), oral progesterone (Megace), Depo Provera, Mirena IUD, endometrial ablation (Novasure/Hydrothermal Ablation) or hysterectomy as definitive surgical management. Discussed risks and benefits of each method. Patient unsure of current desires. Printed patient education handouts were given to the patient to review at home. Bleeding precautions reviewed. To discuss further after upcoming surgery.  HTN, uncontrolled. Patient notes it is usually managed with diet. IF BP remains  elevated on next visit, need to consider workup fo cHTN.    [name], MD [facility]

## 2015-11-10 NOTE — Progress Notes (Signed)
GYNECOLOGY CLINIC PROGRESS NOTE  Subjective:     Sheryl Carr is an 44 y.o. P64 woman who presents for heavy menses.  Referred from Dr. Luan Pulling at [facility] for heavy menses and ultrasound findings (endometrial polyp and fibroid).  Patient notes that she has had heavy menstrual cycles since the birth of her last child (~ 15 years), however has progressively gotten worse over the years. Menses are still regularly occuring,  lasting 6 days. She changes her pad every 1 hours. Clots are 2-4 cm in size. Dysmenorrhea:mild, occurring first 1-2 days of flow.  Current contraception: None.  History of abnormal Pap smear: no.  Last pap smear 07/2015, normal.   Menstrual History: OB History  Gravida Para Term Preterm AB SAB TAB Ectopic Multiple Living  2 2 2       2     # Outcome Date GA Lbr Len/2nd Weight Sex Delivery Anes PTL Lv  2 Term           1 Term               Menarche age: 71  Patient's last menstrual period was 10/27/2015. Period Duration (Days): 5-6 Period Pattern: Regular Menstrual Flow: Heavy Dysmenorrhea: (!) Mild Dysmenorrhea Symptoms: Cramping Denies h/o STIs.    Past Medical History  Diagnosis Date  . HTN (hypertension)   . H/O mammogram 05/11/2015    Past Surgical History  Procedure Laterality Date  . None    . Cesarean section    . Kidney stone surgery      Family History  Problem Relation Age of Onset  . Diabetes Mellitus I Mother   . Diabetes Mellitus I Maternal Grandmother   . Prostate cancer Maternal Grandfather   . Diabetes Mellitus I Paternal Grandmother     Social History   Social History  . Marital Status: Married    Spouse Name: [name]  . Number of Children: 2  . Years of Education: college   Occupational History  . n/a   .  [place] History Main Topics  . Smoking status: Former Smoker    Quit date: 09/24/1990  . Smokeless tobacco: Never Used  . Alcohol Use: Yes     Comment: social  . Drug Use: No   . Sexual Activity: Yes    Birth Control/ Protection: None   Other Topics Concern  . Not on file   Social History Narrative   Patient is married [name]) and lives at home with her husband and one child, one child is in college.   Patient is working full-time.   Patient has a college education.   Patient is right-handed.   Patient drinks 1-2 cups of coffee daily, soda and tea are occasionally, not daily.    Allergies  Allergen Reactions  . Amoxicillin   . Pollen Extract     itchy eyes, dull headaches, runny nose  . Penicillins Rash    Current Outpatient Prescriptions on File Prior to Visit  Medication Sig Dispense Refill  . ferrous fumarate (HEMOCYTE - 106 MG FE) 325 (106 FE) MG TABS tablet Take 1 tablet by mouth 2 (two) times daily.    Marland Kitchen omeprazole (PRILOSEC) 20 MG capsule Take 1 capsule (20 mg total) by mouth daily. 30 capsule 12   No current facility-administered medications on file prior to visit.     Review of Systems A comprehensive review of systems was negative except for: Genitourinary: positive for abnormal menstrual periods and pelvic pressure  and left sided pelvic pain (mostly during pelvic exams)    Objective:    BP 152/91 mmHg  Pulse 75  Ht 5\' 8"  (1.727 m)  Wt 150 lb (68.04 kg)  BMI 22.81 kg/m2  LMP 10/27/2015 Body mass index is 22.81 kg/(m^2).   General:   alert and no distress  Skin:    normal and no rash or abnormalities  Neck:  no adenopathy, no carotid bruit, no JVD, supple, symmetrical, trachea midline and thyroid not enlarged, symmetric, no tenderness/mass/nodules  Abdomen:  normal findings: bowel sounds normal and soft, non-tender and abnormal findings:  mass, located in the lower abdomen, mobile,  Pelvic:   external genitalia normal, no bladder tenderness and vagina normal without discharge.  Cerivx displaced anteriorly and to to the right, no lesions or tenderness.  Uterus enlarged (~ 14 weeks) with normal contour, with large posterior mass,  nontender, slightly fixed.  Difficult to palpate adnexa on left however mild tenderness noted on palpation, right adnexa normal without tenderness   Extremities: No cyanosis, edema, erythema, or tenderness.   Neuro:  Grossly normal.          Labs:   CBC Latest Ref Rng 08/08/2015 07/02/2015 09/07/2013  WBC 3.4 - 10.8 x10E3/uL 5.0 5.7 8.3  Hemoglobin 12.0 - 16.0 g/dL 8.3 (L) 9.8(L) 12.1  Hematocrit 34.0 - 46.6 % 27.3(L) 32.0(L) 35.8  Platelets 150 - 440 K/uL 376 371 266    Lab Results  Component Value Date   IRON 100 09/09/2015   TIBC 432 08/08/2015   FERRITIN 2* 08/08/2015    08/03/2015: Pap negative   08/08/2015: Pelvic Ultrasound    FINDINGS: Uterus  Measurements: 12.5 x 9.1 x 8.1 cm. The uterus is moderately enlarged and is retroverted and retroflexed. There is a posterior fundal intramural 4.7 x 5.5 x 6.2 cm fibroid.  Endometrium  Thickness: 17 mm. The endometrium is thickened and heterogeneous, with the suggestion of 2 hyperechoic endometrial mass is measuring 0.8 cm and 0.7 cm with the suggestion of internal feeding vessels on color Doppler, likely representing endometrial polyps.  Right ovary  Measurements: 3.9 x 2.1 x 3.1 cm. There is a heterogeneously hypoechoic 1.5 x 1.1 x 1.2 cm right ovarian mass with no internal vascularity, in keeping with a hemorrhagic cyst.  Left ovary  Measurements: 3.0 x 1.9 x 3.1 cm. Normal appearance/no adnexal mass.  Other findings  Small amount of free fluid in the right adnexa.  IMPRESSION: 1. Solitary intramural 6.2 cm posterior fundal fibroid. 2. Thickened (17 mm) and heterogeneous endometrium, with the suggestion of two endometrial polyps measuring 8 mm and 7 mm. Consider correlation with saline infusion sonohysterogram and/or hysteroscopy, as clinically warranted. 3. Right ovarian 1.4 cm hemorrhagic cyst. No suspicious ovarian or adnexal findings.    Assessment:    Menorrhagia   Small ovarian cyst  (on right ovary Fibroid uterus Endometrial polyp Small right ovarian hemorrhagic cyst. Hypertension, uncontrolled    Plan:   1) Lengthy discussion had on causes of abnormal uterine bleeding (i.e. Patient's fibroids, and treatment options. Discussed management options for abnormal uterine bleeding including tranexamic acid (Lysteda), oral progesterone (Megace), Depo Provera, Mirena IUD, endometrial ablation (Novasure/Hydrothermal Ablation) or hysterectomy as definitive surgical management.  Discussed risks and benefits of each method.   Patient unsure of current desires.  Printed patient education handouts were given to the patient to review at home. Bleeding precautions reviewed. To discuss further after upcoming surgery. Pre-op labs ordered (TSH, CBC,, Chem 8) 2) Discussion had on  management options regarding endometrial polyps.  Discussed that polyps are typically 95% benign, however are recommended for removal to rule out malignancy by Hysteroscopy D&C with polypectomy.  Risks, benefits, and alternatives discussed with patient. Patient in agreement. Patient aware that she is to be NPO at midnight prior to surgery.  Scheduled for 11/21/2015.  4) Small hemorrhagic cyst (right) - patient currently denies symptoms.  Advised that due to small size, will likely resolve without intervention.  3) HTN, uncontrolled.  Patient notes it is usually managed with diet. IF BP remains elevated on next visit, need to consider workup fo cHTN.   A total of 45 minutes were spent face-to-face with the patient during the encounter and greater than 50% was dealing with counseling and coordination of care.   [name], MD [facility]

## 2015-11-11 ENCOUNTER — Encounter: Payer: Self-pay | Admitting: *Deleted

## 2015-11-11 ENCOUNTER — Inpatient Hospital Stay: Admission: RE | Admit: 2015-11-11 | Payer: No Typology Code available for payment source | Source: Ambulatory Visit

## 2015-11-11 NOTE — Patient Instructions (Signed)
  Your procedure is scheduled on: 11-14-15 Report to [place] To find out your arrival time please call [phone] between 1PM - 3PM on 11-11-15  Remember: Instructions that are not followed completely may result in serious medical risk, up to and including death, or upon the discretion of your surgeon and anesthesiologist your surgery may need to be rescheduled.    _X___ 1. Do not eat food or drink liquids after midnight. No gum chewing or hard candies.     _X___ 2. No Alcohol for 24 hours before or after surgery.   ____ 3. Bring all medications with you on the day of surgery if instructed.    _X___ 4. Notify your doctor if there is any change in your medical condition     (cold, fever, infections).     Do not wear jewelry, make-up, hairpins, clips or nail polish.  Do not wear lotions, powders, or perfumes. You may wear deodorant.  Do not shave 48 hours prior to surgery. Men may shave face and neck.  Do not bring valuables to the hospital.    [facility] is not responsible for any belongings or valuables.               Contacts, dentures or bridgework may not be worn into surgery.  Leave your suitcase in the car. After surgery it may be brought to your room.  For patients admitted to the hospital, discharge time is determined by your treatment team.   Patients discharged the day of surgery will not be allowed to drive home.   Please read over the following fact sheets that you were given:      __X__ Take these medicines the morning of surgery with A SIP OF WATER:    1. OMEPRAZOLE  2. TAKE AN EXTRA OMEPRAZOLE ON Sunday NIGHT  3.   4.  5.  6.  ____ Fleet Enema (as directed)   ____ Use CHG Soap as directed  ____ Use inhalers on the day of surgery  ____ Stop metformin 2 days prior to surgery    ____ Take 1/2 of usual insulin dose the night before surgery and none on the morning of surgery.   ____ Stop Coumadin/Plavix/aspirin-N/A  ____  Stop Anti-inflammatories-NO NSAIDS OR ASA PRODUCTS-TYLENOL OK   ____ Stop supplements until after surgery.    ____ Bring C-Pap to the hospital.

## 2015-11-14 ENCOUNTER — Ambulatory Visit: Payer: PRIVATE HEALTH INSURANCE | Admitting: Anesthesiology

## 2015-11-14 ENCOUNTER — Encounter
Admission: RE | Disposition: A | Discharge status: Home / Self Care | Payer: Self-pay | Source: Ambulatory Visit | Attending: Obstetrics and Gynecology

## 2015-11-14 ENCOUNTER — Encounter: Payer: Self-pay | Admitting: *Deleted

## 2015-11-14 ENCOUNTER — Ambulatory Visit
Admission: RE | Admit: 2015-11-14 | Discharge: 2015-11-14 | Disposition: A | Discharge status: Home / Self Care | Payer: PRIVATE HEALTH INSURANCE | Source: Ambulatory Visit | Attending: Obstetrics and Gynecology | Admitting: Obstetrics and Gynecology

## 2015-11-14 DIAGNOSIS — N840 Polyp of corpus uteri: Secondary | ICD-10-CM | POA: Diagnosis not present

## 2015-11-14 DIAGNOSIS — Z881 Allergy status to other antibiotic agents status: Secondary | ICD-10-CM | POA: Diagnosis not present

## 2015-11-14 DIAGNOSIS — Z880 Allergy status to penicillin: Secondary | ICD-10-CM | POA: Insufficient documentation

## 2015-11-14 DIAGNOSIS — D259 Leiomyoma of uterus, unspecified: Secondary | ICD-10-CM | POA: Insufficient documentation

## 2015-11-14 DIAGNOSIS — N920 Excessive and frequent menstruation with regular cycle: Secondary | ICD-10-CM | POA: Diagnosis not present

## 2015-11-14 DIAGNOSIS — Z9109 Other allergy status, other than to drugs and biological substances: Secondary | ICD-10-CM | POA: Diagnosis not present

## 2015-11-14 DIAGNOSIS — Z8042 Family history of malignant neoplasm of prostate: Secondary | ICD-10-CM | POA: Diagnosis not present

## 2015-11-14 DIAGNOSIS — I10 Essential (primary) hypertension: Secondary | ICD-10-CM | POA: Diagnosis not present

## 2015-11-14 DIAGNOSIS — Z87442 Personal history of urinary calculi: Secondary | ICD-10-CM | POA: Insufficient documentation

## 2015-11-14 DIAGNOSIS — Z79899 Other long term (current) drug therapy: Secondary | ICD-10-CM | POA: Insufficient documentation

## 2015-11-14 DIAGNOSIS — Z87891 Personal history of nicotine dependence: Secondary | ICD-10-CM | POA: Diagnosis not present

## 2015-11-14 DIAGNOSIS — Z833 Family history of diabetes mellitus: Secondary | ICD-10-CM | POA: Diagnosis not present

## 2015-11-14 DIAGNOSIS — K219 Gastro-esophageal reflux disease without esophagitis: Secondary | ICD-10-CM | POA: Insufficient documentation

## 2015-11-14 HISTORY — DX: Chronic kidney disease, unspecified: N18.9

## 2015-11-14 HISTORY — DX: Anemia, unspecified: D64.9

## 2015-11-14 HISTORY — DX: Gastro-esophageal reflux disease without esophagitis: K21.9

## 2015-11-14 HISTORY — PX: HYSTEROSCOPY WITH D & C: SHX1775

## 2015-11-14 LAB — BASIC METABOLIC PANEL
ANION GAP: 5 (ref 5–15)
BUN: 17 mg/dL (ref 6–20)
CALCIUM: 8.5 mg/dL — AB (ref 8.9–10.3)
CO2: 29 mmol/L (ref 22–32)
Chloride: 105 mmol/L (ref 101–111)
Creatinine, Ser: 0.59 mg/dL (ref 0.44–1.00)
Glucose, Bld: 95 mg/dL (ref 65–99)
Potassium: 3.4 mmol/L — ABNORMAL LOW (ref 3.5–5.1)
Sodium: 139 mmol/L (ref 135–145)

## 2015-11-14 LAB — CBC
HCT: 38.3 % (ref 35.0–47.0)
HEMOGLOBIN: 12.6 g/dL (ref 12.0–16.0)
MCH: 28 pg (ref 26.0–34.0)
MCHC: 33 g/dL (ref 32.0–36.0)
MCV: 84.9 fL (ref 80.0–100.0)
Platelets: 241 10*3/uL (ref 150–440)
RBC: 4.51 MIL/uL (ref 3.80–5.20)
RDW: 12.5 % (ref 11.5–14.5)
WBC: 5.8 10*3/uL (ref 3.6–11.0)

## 2015-11-14 LAB — POCT PREGNANCY, URINE: PREG TEST UR: NEGATIVE

## 2015-11-14 SURGERY — DILATATION AND CURETTAGE /HYSTEROSCOPY
Anesthesia: General

## 2015-11-14 MED ORDER — SILVER NITRATE-POT NITRATE 75-25 % EX MISC
CUTANEOUS | Status: AC
  Filled 2015-11-14: qty 5

## 2015-11-14 MED ORDER — FENTANYL CITRATE (PF) 100 MCG/2ML IJ SOLN
INTRAMUSCULAR | Status: DC | PRN
  Administered 2015-11-14 (×2): 50 ug via INTRAVENOUS

## 2015-11-14 MED ORDER — LACTATED RINGERS IV SOLN
INTRAVENOUS | Status: DC
  Administered 2015-11-14 (×2): via INTRAVENOUS

## 2015-11-14 MED ORDER — IBUPROFEN 800 MG PO TABS: 800.0000 mg | ORAL_TABLET | Freq: Three times a day (TID) | ORAL | Status: DC | PRN

## 2015-11-14 MED ORDER — MIDAZOLAM HCL 2 MG/2ML IJ SOLN
INTRAMUSCULAR | Status: DC | PRN
  Administered 2015-11-14: 2 mg via INTRAVENOUS

## 2015-11-14 MED ORDER — HYDROCODONE-ACETAMINOPHEN 5-325 MG PO TABS: 1.0000 | ORAL_TABLET | Freq: Four times a day (QID) | ORAL | Status: DC | PRN

## 2015-11-14 MED ORDER — LACTATED RINGERS IV SOLN: INTRAVENOUS | Status: DC

## 2015-11-14 MED ORDER — ONDANSETRON HCL 4 MG/2ML IJ SOLN
INTRAMUSCULAR | Status: DC | PRN
  Administered 2015-11-14: 4 mg via INTRAVENOUS

## 2015-11-14 MED ORDER — DEXAMETHASONE SODIUM PHOSPHATE 4 MG/ML IJ SOLN
INTRAMUSCULAR | Status: DC | PRN
  Administered 2015-11-14: 8 mg via INTRAVENOUS

## 2015-11-14 MED ORDER — ACETAMINOPHEN 10 MG/ML IV SOLN
INTRAVENOUS | Status: AC
  Filled 2015-11-14: qty 100

## 2015-11-14 MED ORDER — ACETAMINOPHEN 10 MG/ML IV SOLN
INTRAVENOUS | Status: DC | PRN
  Administered 2015-11-14: 1000 mg via INTRAVENOUS

## 2015-11-14 MED ORDER — OXYCODONE HCL 5 MG PO TABS: 5.0000 mg | ORAL_TABLET | Freq: Once | ORAL | Status: DC | PRN

## 2015-11-14 MED ORDER — DOCUSATE SODIUM 100 MG PO CAPS: 100.0000 mg | ORAL_CAPSULE | Freq: Two times a day (BID) | ORAL | Status: DC | PRN

## 2015-11-14 MED ORDER — PROPOFOL 10 MG/ML IV BOLUS
INTRAVENOUS | Status: DC | PRN
  Administered 2015-11-14: 150 mg via INTRAVENOUS

## 2015-11-14 MED ORDER — FENTANYL CITRATE (PF) 100 MCG/2ML IJ SOLN: 25.0000 ug | INTRAMUSCULAR | Status: DC | PRN

## 2015-11-14 MED ORDER — OXYCODONE HCL 5 MG/5ML PO SOLN: 5.0000 mg | Freq: Once | ORAL | Status: DC | PRN

## 2015-11-14 MED ORDER — LIDOCAINE HCL (CARDIAC) 20 MG/ML IV SOLN
INTRAVENOUS | Status: DC | PRN
  Administered 2015-11-14: 40 mg via INTRAVENOUS

## 2015-11-14 SURGICAL SUPPLY — 14 items
CATH ROBINSON RED A/P 16FR (CATHETERS) ×3 IMPLANT
GLOVE BIO SURGEON STRL SZ 6 (GLOVE) ×3 IMPLANT
GLOVE BIOGEL PI IND STRL 6.5 (GLOVE) ×1 IMPLANT
GLOVE BIOGEL PI INDICATOR 6.5 (GLOVE) ×2
GOWN STRL REUS W/ TWL LRG LVL3 (GOWN DISPOSABLE) ×2 IMPLANT
GOWN STRL REUS W/TWL LRG LVL3 (GOWN DISPOSABLE) ×4
IV LACTATED RINGERS 1000ML (IV SOLUTION) ×3 IMPLANT
KIT RM TURNOVER CYSTO AR (KITS) ×3 IMPLANT
PACK DNC HYST (MISCELLANEOUS) ×3 IMPLANT
PAD OB MATERNITY 4.3X12.25 (PERSONAL CARE ITEMS) ×3 IMPLANT
PAD PREP 24X41 OB/GYN DISP (PERSONAL CARE ITEMS) ×3 IMPLANT
SOL PREP PVP 2OZ (MISCELLANEOUS) ×3
SOLUTION PREP PVP 2OZ (MISCELLANEOUS) ×1 IMPLANT
SURGILUBE 2OZ TUBE FLIPTOP (MISCELLANEOUS) ×3 IMPLANT

## 2015-11-14 NOTE — Transfer of Care (Signed)
Immediate Anesthesia Transfer of Care Note  Patient: Sheryl Carr  Procedure(s) Performed: Procedure(s): DILATATION AND CURETTAGE /HYSTEROSCOPY (N/A)  Patient Location: PACU  Anesthesia Type:General  Level of Consciousness: awake  Airway & Oxygen Therapy: Patient Spontanous Breathing and Patient connected to face mask oxygen  Post-op Assessment: Report given to RN and Post -op Vital signs reviewed and stable  Post vital signs: Reviewed and stable  Last Vitals:  Filed Vitals:   11/14/15 0957 11/14/15 1220  BP: 142/107 133/86  Pulse: 76 72  Temp: 36.8 C 37.4 C  Resp: 16 15    Complications: No apparent anesthesia complications

## 2015-11-14 NOTE — Anesthesia Procedure Notes (Signed)
Procedure Name: LMA Insertion Date/Time: 11/14/2015 11:15 AM Performed by: Allean Found Pre-anesthesia Checklist: Patient identified, Emergency Drugs available, Suction available, Patient being monitored and Timeout performed Patient Re-evaluated:Patient Re-evaluated prior to inductionOxygen Delivery Method: Circle system utilized Preoxygenation: Pre-oxygenation with 100% oxygen Intubation Type: IV induction LMA: LMA inserted LMA Size: 4.0 Placement Confirmation: positive ETCO2 and breath sounds checked- equal and bilateral Tube secured with: Tape Dental Injury: Teeth and Oropharynx as per pre-operative assessment

## 2015-11-14 NOTE — H&P (Signed)
UPDATE TO PREVIOUS HISTORY AND PHYSICAL  The patient has been seen and examined.  H&P is up to date, and the following changes are noted:  Patient is a 44 y.o. G2P2033female scheduled for Hysteroscopy D&C, with polypectomy (previous H&P notes endometrial ablation). Indications for procedure are abnormal uterine bleeding and endometrial polyps.  Patient also with h/o large fibroid uterus.     All questions answered.  Patient can proceed to the OR for scheduled procedure.   [name], MD 11/14/2015 10:54 AM

## 2015-11-14 NOTE — Anesthesia Preprocedure Evaluation (Signed)
Anesthesia Evaluation  Patient identified by MRN, date of birth, ID band Patient awake    Reviewed: Allergy & Precautions, H&P , NPO status , Patient's Chart, lab work & pertinent test results  History of Anesthesia Complications Negative for: history of anesthetic complications  Airway Mallampati: III  TM Distance: >3 FB Neck ROM: full    Dental no notable dental hx. (+) Teeth Intact   Pulmonary neg shortness of breath, former smoker,    Pulmonary exam normal breath sounds clear to auscultation       Cardiovascular Exercise Tolerance: Good hypertension, (-) angina(-) Past MI and (-) PND Normal cardiovascular exam Rhythm:regular Rate:Normal     Neuro/Psych Anxiety  Neuromuscular disease negative psych ROS   GI/Hepatic Neg liver ROS, GERD  Controlled,  Endo/Other  negative endocrine ROS  Renal/GU Renal disease  negative genitourinary   Musculoskeletal   Abdominal   Peds  Hematology negative hematology ROS (+)   Anesthesia Other Findings Past Medical History:   H/O mammogram                                   05/11/2015    GERD (gastroesophageal reflux disease)                       Chronic kidney disease                                         Comment:H/O KIDNEY STONES   Anemia                                                       HTN (hypertension)                                             Comment:PT WAS TAKEN OFF HER BP MED IN MAY/JUNE OF 2016              BUT BP IS ELEVATED AGAIN  Past Surgical History:   CESAREAN SECTION                                              KIDNEY STONE SURGERY                                         BMI    Body Mass Index   22.81 kg/m 2      Reproductive/Obstetrics negative OB ROS                             Anesthesia Physical Anesthesia Plan  ASA: III  Anesthesia Plan: General LMA   Post-op Pain Management:    Induction:   Airway  Management Planned:   Additional Equipment:   Intra-op Plan:   Post-operative Plan:  Informed Consent: I have reviewed the patients History and Physical, chart, labs and discussed the procedure including the risks, benefits and alternatives for the proposed anesthesia with the patient or authorized representative who has indicated his/her understanding and acceptance.   Dental Advisory Given  Plan Discussed with: Anesthesiologist, CRNA and Surgeon  Anesthesia Plan Comments:         Anesthesia Quick Evaluation

## 2015-11-14 NOTE — Progress Notes (Signed)
CBC, METB - drawn by lab tech [id number] am

## 2015-11-14 NOTE — Discharge Instructions (Signed)
Hysteroscopy, Care After Refer to this sheet in the next few weeks. These instructions provide you with information on caring for yourself after your procedure. Your health care provider may also give you more specific instructions. Your treatment has been planned according to current medical practices, but problems sometimes occur. Call your health care provider if you have any problems or questions after your procedure.  WHAT TO EXPECT AFTER THE PROCEDURE After your procedure, it is typical to have the following:  You may have some cramping. This normally lasts for a couple days.  You may have bleeding. This can vary from light spotting for a few days to menstrual-like bleeding for 3-7 days. HOME CARE INSTRUCTIONS  Rest for the first 1-2 days after the procedure.  Only take over-the-counter or prescription medicines as directed by your health care provider. Do not take aspirin. It can increase the chances of bleeding.  Take showers instead of baths for 2 weeks or as directed by your health care provider.  Do not drive for 24 hours or as directed.  Do not drink alcohol while taking pain medicine.  Do not use tampons, douche, or have sexual intercourse for 2 weeks or until your health care provider says it is okay.  Take your temperature twice a day for 4-5 days. Write it down each time.  Follow your health care provider's advice about diet, exercise, and lifting.  If you develop constipation, you may:  Take a mild laxative if your health care provider approves.  Add bran foods to your diet.  Drink enough fluids to keep your urine clear or pale yellow.  Try to have someone with you or available to you for the first 24-48 hours, especially if you were given a general anesthetic.  Follow up with your health care provider as directed. SEEK MEDICAL CARE IF:  You feel dizzy or lightheaded.  You feel sick to your stomach (nauseous).  You have abnormal vaginal discharge.  You  have a rash.  You have pain that is not controlled with medicine. SEEK IMMEDIATE MEDICAL CARE IF:  You have bleeding that is heavier than a normal menstrual period.  You have a fever.  You have increasing cramps or pain, not controlled with medicine.  You have new belly (abdominal) pain.  You pass out.  You have pain in the tops of your shoulders (shoulder strap areas).  You have shortness of breath.   This information is not intended to replace advice given to you by your health care provider. Make sure you discuss any questions you have with your health care provider.   Document Released: 09/16/2013 Document Reviewed: 09/16/2013 Elsevier Interactive Patient Education 2016 [place] Anesthesia, Adult, Care After Refer to this sheet in the next few weeks. These instructions provide you with information on caring for yourself after your procedure. Your health care provider may also give you more specific instructions. Your treatment has been planned according to current medical practices, but problems sometimes occur. Call your health care provider if you have any problems or questions after your procedure. WHAT TO EXPECT AFTER THE PROCEDURE After the procedure, it is typical to experience:  Sleepiness.  Nausea and vomiting. HOME CARE INSTRUCTIONS  For the first 24 hours after general anesthesia:  Have a responsible person with you.  Do not drive a car. If you are alone, do not take public transportation.  Do not drink alcohol.  Do not take medicine that has not been prescribed by your health care  provider.  Do not sign important papers or make important decisions.  You may resume a normal diet and activities as directed by your health care provider.  Change bandages (dressings) as directed.  If you have questions or problems that seem related to general anesthesia, call the hospital and ask for the anesthetist or anesthesiologist on call. SEEK MEDICAL  CARE IF:  You have nausea and vomiting that continue the day after anesthesia.  You develop a rash. SEEK IMMEDIATE MEDICAL CARE IF:   You have difficulty breathing.  You have chest pain.  You have any allergic problems.   This information is not intended to replace advice given to you by your health care provider. Make sure you discuss any questions you have with your health care provider.   Document Released: 03/04/2001 Document Revised: 12/17/2014 Document Reviewed: 03/26/2012 Elsevier Interactive Patient Education [employer].

## 2015-11-14 NOTE — Op Note (Signed)
Hysteroscopy Procedure Note  Indications: 44 y.o. [id number] with endometrial polyps noted on ultrasound, menorrhagia, fibroid uterus  Pre-operative Diagnosis: endometrial polyps, menorrhagia, fibroid uterus  Post-operative Diagnosis: same  Surgeon: Surgeon(s) and Role:    * [name], MD - Primary   Assistants: None  Procedure:     * DILATATION & CURETTAGE/HYSTEROSCOPY  - Choice  Anesthesia: General endotracheal anesthesia  ASA Class: 2  Procedure Details: Patient was seen in the preoperative holding area where the consent was reviewed. Patient was consented for the following procedure: Hysteroscopy Dilation and Curettage with NovaSure endometrial ablation. Pt was taken to the operating room # 5.    The patient was then placed under general anesthesia without difficulty.  She was then prepped and draped in the normal sterile fashion, and placed in the dorsal lithotomy position.  A time out was performed.  An exam under anesthesia was performed with the findings noted above.  Straight catheterization was performed. A sterile speculum was inserted into vagina. A single-tooth tenaculum was used to grasp the anterior lip of the cervix. Cervical dilation was performed. A 5 mm hysteroscope was introduced into the uterus under direct visualization. The cavity was allowed to fill, and then the entire cavity was explored with the findings described above. The hysteroscope was removed, and a polypectomy was performed using the polyp forceps.  Next, a sharp curette was then passed into the uterus and endometrial sampling was collected for pathology.   The hysteroscope was then re-introduced for final survey.  The hysteroscope was removed from the patient's uterine cavity. The tenaculum was removed and excellent hemostasis was noted. The speculum was removed from the vagina.   All instrument and sponge counts were correct at the end of the procedure x 2.  The patient tolerated the procedure well.   She was awakened and taken to the PACU in stable condition.   Findings: Uterus sounded to 10 cm.  Large posterior fibroid palpable.  Proliferative endometrium with possible polyp vs pedunculated endometrial tissue.  Tubal ostia were visualized bilaterally.  No intrauterine masses.  No perforations noted.   Estimated Blood Loss:  minimal ml      Drains: straight catheterization with 200 cc at start of procedure.          Total IV Fluids:  700 ml  Fluid Deficit: 75 ml  Specimens:  Endometrial curettings, endometrial polyp vs proliferative endometrial tissue         Implants: None         Complications:  None; patient tolerated the procedure well.         Disposition: PACU - hemodynamically stable.         Condition: stable   [name], MD [facility]

## 2015-11-14 NOTE — Anesthesia Postprocedure Evaluation (Signed)
Anesthesia Post Note  Patient: Sheryl Carr  Procedure(s) Performed: Procedure(s) (LRB): DILATATION AND CURETTAGE /HYSTEROSCOPY (N/A)  Patient location during evaluation: PACU Anesthesia Type: General Level of consciousness: awake and alert Pain management: pain level controlled Vital Signs Assessment: post-procedure vital signs reviewed and stable Respiratory status: spontaneous breathing, nonlabored ventilation, respiratory function stable and patient connected to nasal cannula oxygen Cardiovascular status: blood pressure returned to baseline and stable Postop Assessment: no signs of nausea or vomiting Anesthetic complications: no    Last Vitals:  Filed Vitals:   11/14/15 1304 11/14/15 1315  BP: 124/94 132/93  Pulse: 67 68  Temp: 37 C   Resp: 12     Last Pain: There were no vitals filed for this visit.               Precious Haws Kayden Hutmacher

## 2015-11-15 LAB — SURGICAL PATHOLOGY

## 2015-11-23 ENCOUNTER — Telehealth: Payer: Self-pay | Admitting: Obstetrics and Gynecology

## 2015-11-23 NOTE — Telephone Encounter (Signed)
Sheryl Carr called for results from procedure done last Monday 12/5.

## 2015-11-24 NOTE — Telephone Encounter (Signed)
Called pt informed her of negative pathology.

## 2015-11-30 ENCOUNTER — Encounter: Payer: Self-pay | Admitting: Obstetrics and Gynecology

## 2015-11-30 ENCOUNTER — Ambulatory Visit (INDEPENDENT_AMBULATORY_CARE_PROVIDER_SITE_OTHER): Payer: No Typology Code available for payment source | Admitting: Obstetrics and Gynecology

## 2015-11-30 VITALS — BP 131/79 | HR 71 | Ht 68.0 in | Wt 148.7 lb

## 2015-11-30 DIAGNOSIS — D259 Leiomyoma of uterus, unspecified: Secondary | ICD-10-CM | POA: Diagnosis not present

## 2015-11-30 DIAGNOSIS — N898 Other specified noninflammatory disorders of vagina: Secondary | ICD-10-CM

## 2015-11-30 DIAGNOSIS — N920 Excessive and frequent menstruation with regular cycle: Secondary | ICD-10-CM

## 2015-11-30 NOTE — Patient Instructions (Signed)
1.  Vaginal discharge appears to be normal/physiologic.  No need for antibiotics. 2.  Follow-up with Dr. Marcelline Mates the end of January for further discussion regarding fibroids and abnormal uterine bleeding

## 2015-11-30 NOTE — Progress Notes (Signed)
Chief complaint: 1.  Vaginal discharge, chronic. 2.  Follow-up after surgery. 3.  Status post hysteroscopy/D&C for menorrhagia. 4.  History of fibroids.  Patient presents for evaluation of chronic vaginitis.  Watery discharge that is not malodorous and is not associated with itching or burning.  This has been ongoing for several months, even prior to her surgery.  Patient had hysteroscopy/D&C for menorrhagia several weeks ago by Dr. Marcelline Mates.  Findings at surgery included endometrial polypoid type tissue and a large posterior uterine fibroid. Pathology from surgery was benign With SecretTory type endometrium with polypoid features without evidence of hyperplasia or carcinoma  OBJECTIVE: BP 131/79 mmHg  Pulse 71  Ht 5\' 8"  (1.727 m)  Wt 148 lb 11.2 oz (67.45 kg)  BMI 22.62 kg/m2  LMP 11/12/2015 Pleasant white female in no acute distress.  Alert and oriented. Abdomen: Soft, nontender. Pelvic: External genitalia-normal BUS-normal Vagina-thin white secretions present; no odor. Cervix-no lesions Uterus-retroverted, proximally 12 weeks size with large posterior fibroid filling the cul-de-sac, immobile.  PROCEDURE: Wet prep. Normal saline-normal; Epithelial cells present; no clue cells; no Trichomonas KOH-normal Without evidence of hyphae.  ASSESSMENT: 1.  Vaginal discharge, unclear etiology, appears physiologic.  On wet prep today. 2.  Menorrhagia, status post hysteroscopy/D&C with benign endometrium. 3.  Large posterior fibroid with uterus approximately 12 weeks size and immobile with fibroid filling the cul-de-sac.  PLAN: 1.  Reassurance given regarding vaginal discharge.  Follow-up as needed if discharge persists or worsens. 2.  Patient is to follow-up with Dr. Marcelline Mates towards the end of January for further management planning regarding her menorrhagia and uterine fibroid.  Questions were answered regarding hormonal regulation of bleeding as well as discussion regarding Lupron for  decrease in the size of fibroid.  Multiple questions were answered.  A total of 15 minutes were spent face-to-face with the patient during this encounter and over half of that time dealt with counseling and coordination of care.  Brayton Mars, MD  Note: This dictation was prepared with Dragon dictation along with smaller phrase technology. Any transcriptional errors that result from this process are unintentional.

## 2015-12-30 ENCOUNTER — Other Ambulatory Visit: Payer: Self-pay | Admitting: Family Medicine

## 2015-12-30 DIAGNOSIS — K219 Gastro-esophageal reflux disease without esophagitis: Secondary | ICD-10-CM

## 2015-12-30 MED ORDER — OMEPRAZOLE 20 MG PO CPDR: 20.0000 mg | DELAYED_RELEASE_CAPSULE | Freq: Every day | ORAL | Status: DC

## 2016-04-08 IMAGING — MG MM DIGITAL SCREENING BILAT W/ CAD
1 series · 4 of 4 positions shown · non-contrast
Comparison: Previous exam(s).

CLINICAL DATA: Screening.

EXAM:
DIGITAL SCREENING BILATERAL MAMMOGRAM WITH CAD

[R CC · right · 4 of 4 slices shown]
[im 1/4]
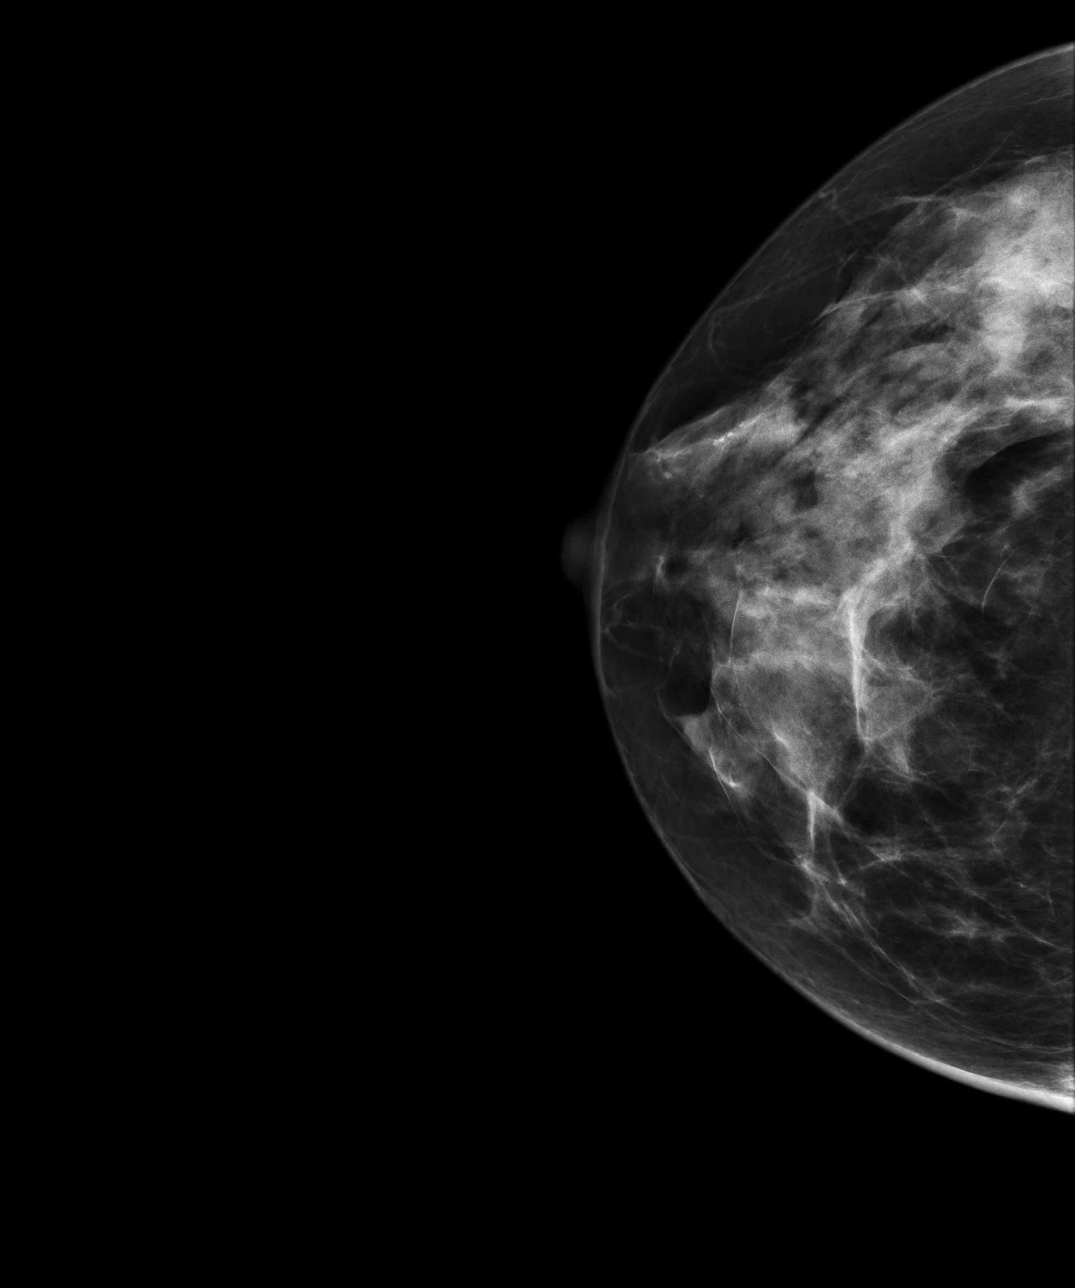
[im 2/4]
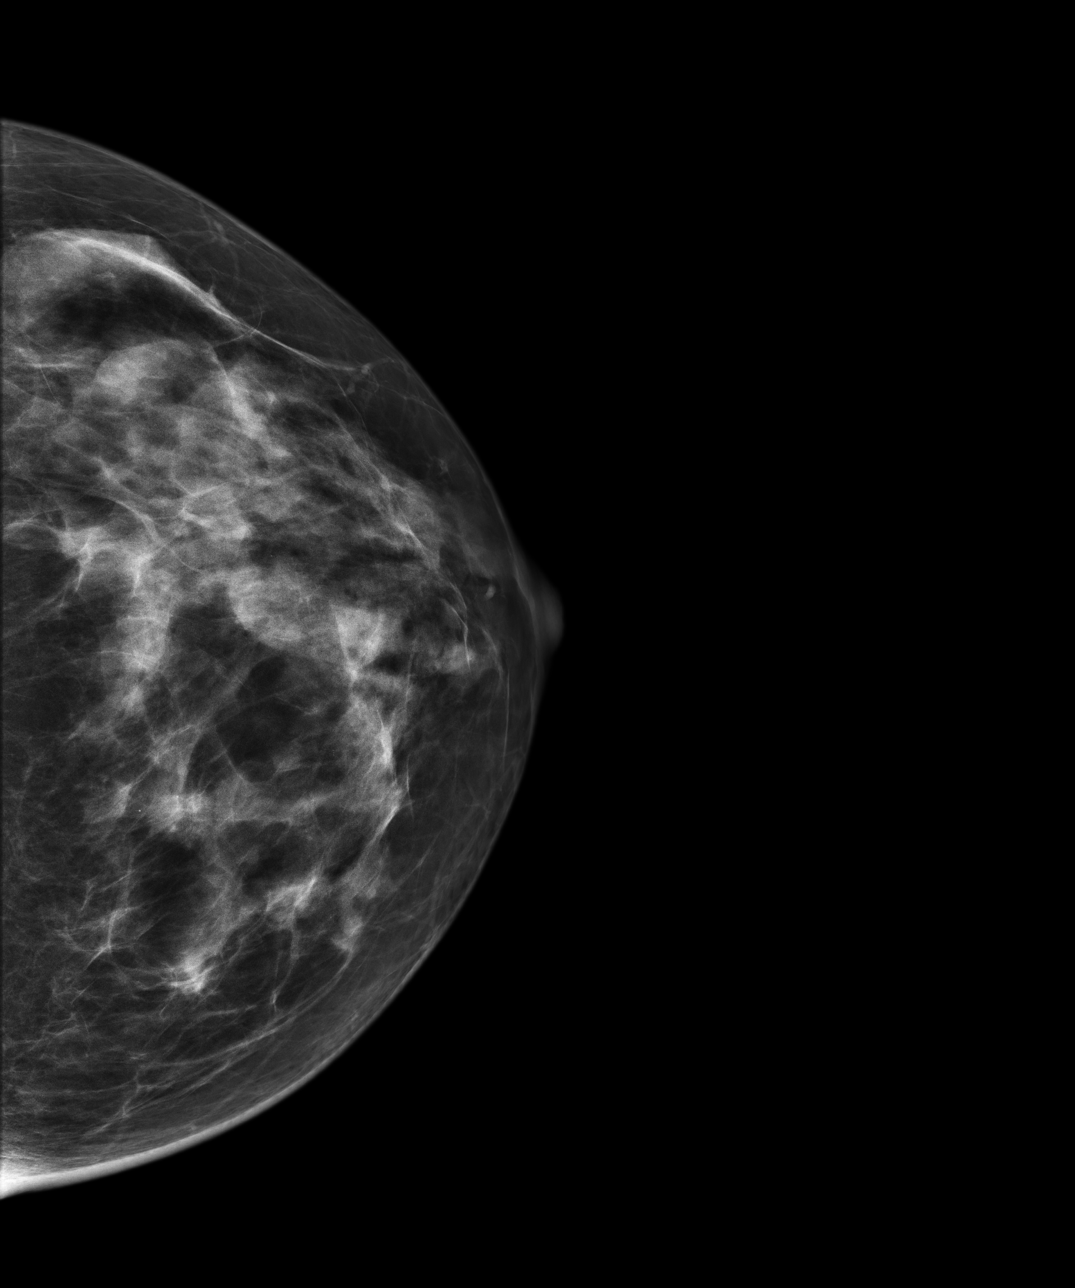
[im 3/4]
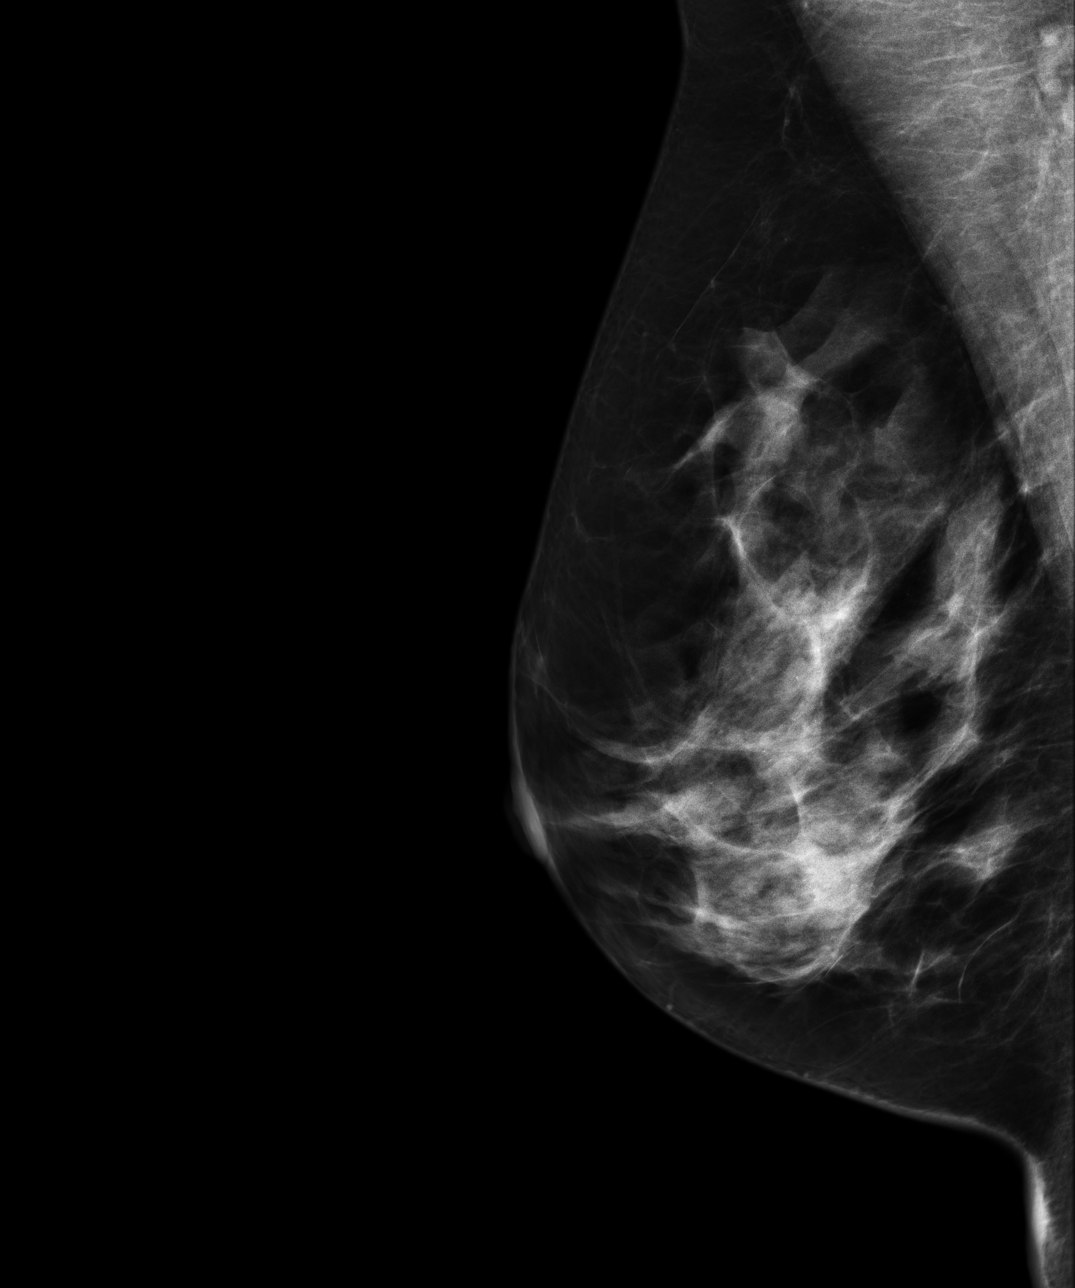
[im 4/4]
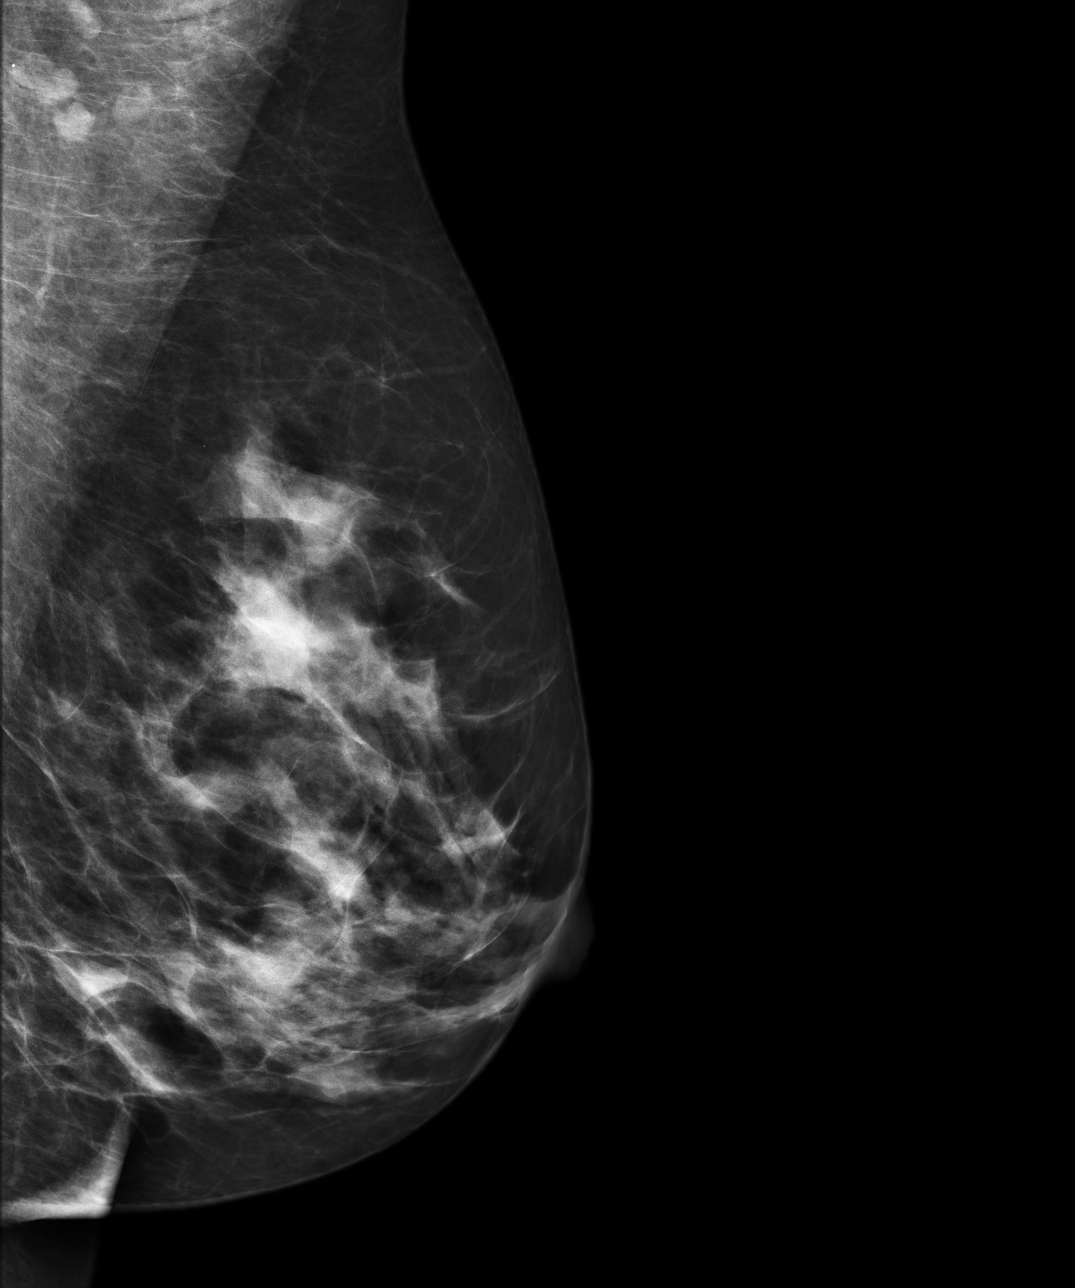

[4 of 4 positions shown; findings below may reference images not displayed]

ACR Breast Density Category c: The breast tissue is heterogeneously
dense, which may obscure small masses.
FINDINGS: There are no findings suspicious for malignancy. Images were
processed with CAD.
IMPRESSION: No mammographic evidence of malignancy. A result letter of this
screening mammogram will be mailed directly to the patient.

RECOMMENDATION:
Screening mammogram in one year. (Code:YJ-2-FEZ)

BI-RADS CATEGORY  1: Negative.

## 2016-04-09 ENCOUNTER — Other Ambulatory Visit: Payer: Self-pay | Admitting: Family Medicine

## 2016-05-16 ENCOUNTER — Ambulatory Visit: Payer: No Typology Code available for payment source | Admitting: Obstetrics and Gynecology

## 2016-06-05 ENCOUNTER — Encounter: Payer: Self-pay | Admitting: Obstetrics and Gynecology

## 2016-06-05 ENCOUNTER — Ambulatory Visit (INDEPENDENT_AMBULATORY_CARE_PROVIDER_SITE_OTHER): Payer: Managed Care, Other (non HMO) | Admitting: Obstetrics and Gynecology

## 2016-06-05 VITALS — BP 127/71 | HR 80 | Ht 68.0 in | Wt 154.8 lb

## 2016-06-05 DIAGNOSIS — D251 Intramural leiomyoma of uterus: Secondary | ICD-10-CM | POA: Diagnosis not present

## 2016-06-05 DIAGNOSIS — N938 Other specified abnormal uterine and vaginal bleeding: Secondary | ICD-10-CM

## 2016-06-05 MED ORDER — NORETHINDRONE 0.35 MG PO TABS: 1.0000 | ORAL_TABLET | Freq: Every day | ORAL | Status: DC

## 2016-06-06 NOTE — Progress Notes (Signed)
GYNECOLOGY PROGRESS NOTE  Subjective:    Patient ID: Sheryl Carr, female    DOB: Apr 19, 1971, 45 y.o.   MRN: [id number]  HPI  Patient is a 45 y.o. G49P2002 female with h/o uterine fibroids and DUB who presents for complaints of worsening menstrual bleeding. Of note, patient had a Hysteroscopy D&C with polypectomy in December of 2016.  Notes that since that time, her cycles have become longer and more painful.  Periods now last 8-10 days, with heavy bleeding for ~ 6-7 days and then light bleeding for 2-3 days.  Notes moderate dysmenorrhea associated with cycles.  Patient's last menstrual period was 05/22/2016.   Past Medical History  Diagnosis Date  . H/O mammogram 05/11/2015  . GERD (gastroesophageal reflux disease)   . Chronic kidney disease     H/O KIDNEY STONES  . Anemia   . HTN (hypertension)     PT WAS TAKEN OFF HER BP MED IN MAY/JUNE OF 2016 BUT BP IS ELEVATED AGAIN    Family History  Problem Relation Age of Onset  . Diabetes Mellitus I Mother   . Diabetes Mellitus I Maternal Grandmother   . Prostate cancer Maternal Grandfather   . Diabetes Mellitus I Paternal Grandmother    Past Surgical History  Procedure Laterality Date  . Cesarean section    . Kidney stone surgery    . Hysteroscopy w/d&c N/A 11/14/2015    Procedure: DILATATION AND CURETTAGE /HYSTEROSCOPY;  Surgeon: [name], MD;  Location: [facility] ORS;  Service: Gynecology;  Laterality: N/A;    Social History   Social History  . Marital Status: Married    Spouse Name: Sheryl Carr  . Number of Children: 2  . Years of Education: college   Occupational History  . n/a   .  [place] History Main Topics  . Smoking status: Former Smoker    Quit date: 09/24/1990  . Smokeless tobacco: Never Used  . Alcohol Use: Yes     Comment: social  . Drug Use: No  . Sexual Activity: Yes    Birth Control/ Protection: None   Other Topics Concern  . Not on file   Social History Narrative   Patient is  married Sheryl Carr) and lives at home with her husband and one child, one child is in college.   Patient is working full-time.   Patient has a college education.   Patient is right-handed.   Patient drinks 1-2 cups of coffee daily, soda and tea are occasionally, not daily.    Current Outpatient Prescriptions on File Prior to Visit  Medication Sig Dispense Refill  . ferrous fumarate (HEMOCYTE - 106 MG FE) 325 (106 FE) MG TABS tablet Take 1 tablet by mouth 2 (two) times daily.    Marland Kitchen ibuprofen (ADVIL,MOTRIN) 800 MG tablet Take 1 tablet (800 mg total) by mouth every 8 (eight) hours as needed. 60 tablet 1  . omeprazole (PRILOSEC) 20 MG capsule TAKE 1 CAPSULE BY MOUTH EVERY MORNING FOR GERD 30 capsule 3   No current facility-administered medications on file prior to visit.    Allergies  Allergen Reactions  . Amoxicillin   . Pollen Extract     itchy eyes, dull headaches, runny nose  . Penicillins Rash     Review of Systems Pertinent items noted in HPI and remainder of comprehensive ROS otherwise negative.   Objective:   Blood pressure 127/71, pulse 80, height 5\' 8"  (1.727 m), weight 154 lb 12.8 oz (70.217 kg), last menstrual  period 05/22/2016. General appearance: alert and no distress Abdomen: soft, non-tender; bowel sounds normal; no masses,  no organomegaly Pelvic: deferred (at patient's request) Extremities: extremities normal, atraumatic, no cyanosis or edema Neurologic: Grossly normal   Assessment:   H/o fibroid uterus Menorrhagia with regular cycle  Plan:   - Will repeat ultrasound as it has been almost 1 year since last sono, to assess size of uterine fibroid (last sono noted largest fibroid 6 cm, posterior).  - Discussion had on management options.  Options for abnormal uterine bleeding include tranexamic acid (Lysteda), oral progesterone, Depo Provera, Mirena IUD, endometrial ablation (Novasure/Hydrothermal Ablation) or hysterectomy as definitive surgical management.   Discussed risks and benefits of each method.   Patient desires management with oral progesterone.  Prescribed Camilla.  Bleeding precautions reviewed.  - Patient to f/u in 2-3 months to reassess symptoms.    A total of 15 minutes were spent face-to-face with the patient during this encounter and over half of that time dealt with counseling and coordination of care.   [name], MD [facility]

## 2016-06-28 ENCOUNTER — Ambulatory Visit (INDEPENDENT_AMBULATORY_CARE_PROVIDER_SITE_OTHER): Payer: Managed Care, Other (non HMO)

## 2016-06-28 DIAGNOSIS — N938 Other specified abnormal uterine and vaginal bleeding: Secondary | ICD-10-CM | POA: Diagnosis not present

## 2016-06-28 DIAGNOSIS — D251 Intramural leiomyoma of uterus: Secondary | ICD-10-CM | POA: Diagnosis not present

## 2016-07-04 ENCOUNTER — Other Ambulatory Visit: Payer: Self-pay | Admitting: Family Medicine

## 2016-07-09 ENCOUNTER — Telehealth: Payer: Self-pay | Admitting: Obstetrics and Gynecology

## 2016-07-09 NOTE — Telephone Encounter (Signed)
Contacted patient regarding ultrasound results. Fibroid size is relatively stable, currently 6.7 cm (previously 6.4 cm on prior ultrasound last year). It does appear that she may have a nontender endometrial polyp based on recent ultrasound. Patient is currently taking progesterone OCPs to manage abnormal uterine bleeding,that the pills are currently working for her. Has an appointment scheduled in another 2 months to follow up abnormal bleeding. If the bleeding is no better control patient is considering surgical management with hysterectomy, however if bleeding is controlled will continue on OCPs and consider surgery for polypectomy only.

## 2016-08-15 ENCOUNTER — Other Ambulatory Visit: Payer: Self-pay | Admitting: Obstetrics and Gynecology

## 2016-08-15 DIAGNOSIS — Z1231 Encounter for screening mammogram for malignant neoplasm of breast: Secondary | ICD-10-CM

## 2016-08-31 ENCOUNTER — Other Ambulatory Visit: Payer: Self-pay | Admitting: Obstetrics and Gynecology

## 2016-08-31 ENCOUNTER — Ambulatory Visit
Admission: RE | Admit: 2016-08-31 | Discharge: 2016-08-31 | Disposition: A | Discharge status: Home / Self Care | Payer: Managed Care, Other (non HMO) | Source: Ambulatory Visit | Attending: Obstetrics and Gynecology | Admitting: Obstetrics and Gynecology

## 2016-08-31 DIAGNOSIS — Z1231 Encounter for screening mammogram for malignant neoplasm of breast: Secondary | ICD-10-CM | POA: Diagnosis present

## 2016-09-05 ENCOUNTER — Ambulatory Visit (INDEPENDENT_AMBULATORY_CARE_PROVIDER_SITE_OTHER): Payer: Managed Care, Other (non HMO) | Admitting: Obstetrics and Gynecology

## 2016-09-05 ENCOUNTER — Encounter: Payer: Self-pay | Admitting: Obstetrics and Gynecology

## 2016-09-05 VITALS — BP 155/94 | HR 76 | Ht 68.0 in | Wt 153.6 lb

## 2016-09-05 DIAGNOSIS — N920 Excessive and frequent menstruation with regular cycle: Secondary | ICD-10-CM

## 2016-09-05 DIAGNOSIS — N840 Polyp of corpus uteri: Secondary | ICD-10-CM | POA: Diagnosis not present

## 2016-09-05 DIAGNOSIS — I10 Essential (primary) hypertension: Secondary | ICD-10-CM

## 2016-09-05 DIAGNOSIS — D251 Intramural leiomyoma of uterus: Secondary | ICD-10-CM | POA: Diagnosis not present

## 2016-09-05 MED ORDER — TRANEXAMIC ACID 650 MG PO TABS: 1300.0000 mg | ORAL_TABLET | Freq: Three times a day (TID) | ORAL | 2 refills | Status: DC

## 2016-09-05 NOTE — Progress Notes (Signed)
    GYNECOLOGY PROGRESS NOTE  Subjective:    Patient ID: Sheryl Carr, female    DOB: 06-08-1971, 45 y.o.   MRN: [id number]  HPI  Patient is a 45 y.o. G1P2002 female who presents for f/u of medications for h/o uterine fibroids and DUB.  Patient is currently on progesterone OCPs.  Has been on pills x 3 months. Reports that there hasn't been much change in her cycles. Periods still lasting 8-10 days, with heavy bleeding for ~ 6-7 days and then light bleeding for 2-3 days.  Does however note moderate improvement with dysmenorrhea.  Desires to discuss other options.   The following portions of the patient's history were reviewed and updated as appropriate: allergies, current medications, past family history, past medical history, past social history, past surgical history and problem list.  Review of Systems Pertinent items noted in HPI and remainder of comprehensive ROS otherwise negative.   Objective:   Blood pressure (!) 155/94, pulse 76, height 5\' 8"  (1.727 m), weight 153 lb 9.6 oz (69.7 kg), last menstrual period 09/05/2016. General appearance: alert and no distress Exam deferred.    Imaging: Pelvic Ultrasound (06/28/2016):  Indications:Enlarged Uterus and AUB Findings:  The uterus measures 11 x 7.6 x 10.4cm. Echo texture is heterogenous with and without evidence of  A focal mass. Within the uterus is a suspected fibroid measuring: Fibroid 1: 6 x 6.7 x 6.7cm  Left uterine fundus communicating with the endometrium.  The Endometrium Is thickened and measures 12.3 mm.  Hypoechoic mass in fundal portion of endometrium with internal color flow measures 1.5 x .8 x 1.3cm.  Right Ovary measures 4.1 x 2.2 x 2 cm. It is normal in appearance. Left Ovary measures 3.8 x 2.4 x 2.3 cm. It is normal appearance. Survey of the adnexa demonstrates no adnexal masses. There is no free fluid in the cul de sac.  Impression: 1. Fibroid uterus. Fibroid is larger than on the previous scan. 2.  Possible endometrial polyp.  Recommendations: 1.Clinical correlation with the patient's History and Physical Exam.  Assessment:   DUB Fibroid uterus Possible endometrial polyp H/o endometrial polyp s/p polypectomy last year Elevated BP  Plan:   - Discussed management options again for abnormal uterine bleeding including tranexamic acid (Lysteda), oral progesterone (Megace), Depo Provera, or hysterectomy as definitive surgical management.  Discussed risks and benefits of each method.   Patient now strongly considering hysterectomy. Printed patient education handouts were given to the patient to review at home. Discussed procedure of hysterectomy in detail.  All questions answered. Lysteda prescribed as needed for now,  bleeding precautions reviewed.    - Also discussed use of Lupron to help with symptoms of bleeding and decreasing the size of the fibroids, such that this would reduce her chances of needing abdominal surgery and could reduce blood loss during surgery.  Patient desires trial of Lupron prior to hysterectomy.  Will order. Can use for ~3 months and reassess. Can use up to a total of 6 months.  - Elevated BP, patient with h/o HTN, notes compliance with medications. May need to be seen by PCP prior to scheduling surgery if BPs remain elevated.  - To f/u after 3 months of Lupron.    A total of 30 minutes were spent face-to-face with the patient during this encounter and over half of that time dealt with counseling and coordination of care.   [name], MD [facility]

## 2016-10-02 ENCOUNTER — Ambulatory Visit: Payer: Self-pay | Admitting: Physician Assistant

## 2016-10-02 ENCOUNTER — Encounter: Payer: Self-pay | Admitting: Physician Assistant

## 2016-10-02 VITALS — BP 140/102 | HR 78 | Temp 98.6°F

## 2016-10-02 DIAGNOSIS — R05 Cough: Secondary | ICD-10-CM

## 2016-10-02 DIAGNOSIS — R059 Cough, unspecified: Secondary | ICD-10-CM

## 2016-10-02 NOTE — Progress Notes (Signed)
S: states cough for about a week, only in the mornings, will get mostly clear mucus out then will be ok by using cough drops for the rest of the day, no fever/chills, no otc meds other than cough drops, no cp/sob  O: vitals w elevated bp, 140/102, tms dull, nasal mucosa pink and boggy, throat cobblestoned, neck supple no lymph, lungs c t a, cv rrr  A: htn, cough due to allergens  P: otc allergy med without decongestant, discuss bp with regular doctor as may be related to bcp, pt states she understands and will make an appointment with her doctor

## 2016-10-22 ENCOUNTER — Ambulatory Visit (INDEPENDENT_AMBULATORY_CARE_PROVIDER_SITE_OTHER): Payer: Managed Care, Other (non HMO) | Admitting: Family Medicine

## 2016-10-22 ENCOUNTER — Encounter: Payer: Self-pay | Admitting: Family Medicine

## 2016-10-22 VITALS — BP 150/100 | HR 82 | Temp 98.2°F | Resp 16 | Ht 68.0 in | Wt 153.0 lb

## 2016-10-22 DIAGNOSIS — E7800 Pure hypercholesterolemia, unspecified: Secondary | ICD-10-CM

## 2016-10-22 DIAGNOSIS — D251 Intramural leiomyoma of uterus: Secondary | ICD-10-CM | POA: Diagnosis not present

## 2016-10-22 DIAGNOSIS — I10 Essential (primary) hypertension: Secondary | ICD-10-CM | POA: Diagnosis not present

## 2016-10-22 DIAGNOSIS — N920 Excessive and frequent menstruation with regular cycle: Secondary | ICD-10-CM | POA: Diagnosis not present

## 2016-10-22 MED ORDER — LOSARTAN POTASSIUM 50 MG PO TABS: 50.0000 mg | ORAL_TABLET | Freq: Every day | ORAL | 6 refills | Status: DC

## 2016-10-22 NOTE — Progress Notes (Signed)
Name: Sheryl Carr   MRN: [id number]    DOB: 08-31-1971   Date:10/22/2016       Progress Note  Subjective  Chief Complaint  Chief Complaint  Patient presents with  . Hypertension    HPI Here for check of BP.  BPs at home have run in range from 140-155/95-110.  She has noticed some extra headaches over past few months.  She had been put on BCPs by he GYN for heavy menstrual bleeding for the past 5 months.   She has had dysmenorrhea ad heavy uterine bleeding and recurrent uterine polyps and uterine fibroids.  She has discussed hysterectomy, but she has not decided yet.  No problem-specific Assessment & Plan notes found for this encounter.   Past Medical History:  Diagnosis Date  . Anemia   . Chronic kidney disease    H/O KIDNEY STONES  . GERD (gastroesophageal reflux disease)   . H/O mammogram 05/11/2015  . HTN (hypertension)    PT WAS TAKEN OFF HER BP MED IN MAY/JUNE OF 2016 BUT BP IS ELEVATED AGAIN    Past Surgical History:  Procedure Laterality Date  . CESAREAN SECTION    . HYSTEROSCOPY W/D&C N/A 11/14/2015   Procedure: DILATATION AND CURETTAGE /HYSTEROSCOPY;  Surgeon: Sheryl Maid, MD;  Location: [facility] ORS;  Service: Gynecology;  Laterality: N/A;  . KIDNEY STONE SURGERY      Family History  Problem Relation Age of Onset  . Diabetes Mellitus I Mother   . Diabetes Mellitus I Maternal Grandmother   . Prostate cancer Maternal Grandfather   . Diabetes Mellitus I Paternal Grandmother     Social History   Social History  . Marital status: Married    Spouse name: Sheryl Carr  . Number of children: 2  . Years of education: college   Occupational History  . n/a   .  [place] History Main Topics  . Smoking status: Former Smoker    Quit date: 09/24/1990  . Smokeless tobacco: Never Used  . Alcohol use Yes     Comment: social  . Drug use: No  . Sexual activity: Yes    Birth control/ protection: None, Pill   Other Topics Concern  . Not on file    Social History Narrative   Patient is married Sheryl Carr) and lives at home with her husband and one child, one child is in college.   Patient is working full-time.   Patient has a college education.   Patient is right-handed.   Patient drinks 1-2 cups of coffee daily, soda and tea are occasionally, not daily.     Current Outpatient Prescriptions:  .  ibuprofen (ADVIL,MOTRIN) 800 MG tablet, Take 1 tablet (800 mg total) by mouth every 8 (eight) hours as needed., Disp: 60 tablet, Rfl: 1 .  Multiple Vitamins-Calcium (ONE-A-DAY WOMENS FORMULA PO), Take by mouth., Disp: , Rfl:  .  omeprazole (PRILOSEC) 20 MG capsule, TAKE 1 CAPSULE BY MOUTH EVERY MORNING FOR GERD, Disp: 30 capsule, Rfl: 12 .  losartan (COZAAR) 50 MG tablet, Take 1 tablet (50 mg total) by mouth daily., Disp: 30 tablet, Rfl: 6  Allergies  Allergen Reactions  . Amoxicillin   . Pollen Extract     itchy eyes, dull headaches, runny nose  . Penicillins Rash     Review of Systems  Constitutional: Negative for chills, fever, malaise/fatigue and weight loss.  HENT: Negative for hearing loss and tinnitus.   Eyes: Negative for blurred vision and double vision.  Respiratory: Negative for cough, shortness of breath and wheezing.   Cardiovascular: Negative for chest pain, palpitations and leg swelling.  Gastrointestinal: Negative for abdominal pain, blood in stool and heartburn.  Genitourinary: Negative for dysuria, frequency and urgency.  Skin: Negative for rash.  Neurological: Positive for headaches. Negative for dizziness, tingling, tremors and weakness.      Objective  Vitals:   10/22/16 1345 10/22/16 1433  BP: (!) 162/97 (!) 150/100  Pulse: 82   Resp: 16   Temp: 98.2 F (36.8 C)   TempSrc: Oral   Weight: 153 lb (69.4 kg)   Height: 5\' 8"  (1.727 m)     Physical Exam  Constitutional: She is oriented to person, place, and time and well-developed, well-nourished, and in no distress. No distress.  HENT:  Head:  Normocephalic and atraumatic.  Eyes: Conjunctivae and EOM are normal. Pupils are equal, round, and reactive to light. No scleral icterus.  Neck: Normal range of motion. Neck supple. Carotid bruit is not present. No thyromegaly present.  Cardiovascular: Regular rhythm.  Exam reveals no gallop and no friction rub.   Murmur heard.  Systolic murmur is present with a grade of 2/6  LLSB  Pulmonary/Chest: Effort normal.  Abdominal: Soft. Bowel sounds are normal. She exhibits no distension and no mass. There is tenderness (LLQ).  Musculoskeletal: She exhibits no edema.  Lymphadenopathy:    She has no cervical adenopathy.  Neurological: She is alert and oriented to person, place, and time.  Vitals reviewed.      No results found for this or any previous visit (from the past 2160 hour(s)).   Assessment & Plan  Problem List Items Addressed This Visit      Cardiovascular and Mediastinum   Essential (primary) hypertension - Primary   Relevant Medications   losartan (COZAAR) 50 MG tablet   Other Relevant Orders   COMPLETE METABOLIC PANEL WITH GFR     Genitourinary   Intramural leiomyoma of uterus     Other   Elevated LDL cholesterol level   Relevant Orders   Lipid Profile   Menorrhagia with regular cycle   Relevant Orders   CBC with Differential      Meds ordered this encounter  Medications  . losartan (COZAAR) 50 MG tablet    Sig: Take 1 tablet (50 mg total) by mouth daily.    Dispense:  30 tablet    Refill:  6   1. Essential (primary) hypertension  - losartan (COZAAR) 50 MG tablet; Take 1 tablet (50 mg total) by mouth daily.  Dispense: 30 tablet; Refill: 6 - COMPLETE METABOLIC PANEL WITH GFR  2. Intramural leiomyoma of uterus   3. Menorrhagia with regular cycle Top BCP. - CBC with Differential  4. Elevated LDL cholesterol level  - Lipid Profile

## 2016-10-23 ENCOUNTER — Telehealth: Payer: Self-pay

## 2016-10-23 ENCOUNTER — Other Ambulatory Visit: Payer: Managed Care, Other (non HMO)

## 2016-10-23 NOTE — Telephone Encounter (Signed)
Called [facility] to inquire about pt's Lupron order,

## 2016-10-24 LAB — LIPID PANEL
CHOLESTEROL: 145 mg/dL (ref <200)
HDL: 45 mg/dL — ABNORMAL LOW (ref >50)
LDL Cholesterol: 89 mg/dL (ref <100)
TRIGLYCERIDES: 56 mg/dL (ref <150)
Total CHOL/HDL Ratio: 3.2 Ratio (ref <5.0)
VLDL: 11 mg/dL (ref <30)

## 2016-10-24 LAB — COMPLETE METABOLIC PANEL WITH GFR
ALBUMIN: 4 g/dL (ref 3.6–5.1)
ALK PHOS: 47 U/L (ref 33–115)
ALT: 8 U/L (ref 6–29)
AST: 11 U/L (ref 10–30)
BILIRUBIN TOTAL: 0.5 mg/dL (ref 0.2–1.2)
BUN: 12 mg/dL (ref 7–25)
CALCIUM: 8.9 mg/dL (ref 8.6–10.2)
CO2: 27 mmol/L (ref 20–31)
Chloride: 104 mmol/L (ref 98–110)
Creat: 0.6 mg/dL (ref 0.50–1.10)
GLUCOSE: 96 mg/dL (ref 65–99)
POTASSIUM: 4.5 mmol/L (ref 3.5–5.3)
SODIUM: 138 mmol/L (ref 135–146)
TOTAL PROTEIN: 6.4 g/dL (ref 6.1–8.1)

## 2016-10-24 LAB — CBC WITH DIFFERENTIAL/PLATELET
BASOS ABS: 52 {cells}/uL (ref 0–200)
Basophils Relative: 1 %
EOS PCT: 2 %
Eosinophils Absolute: 104 cells/uL (ref 15–500)
HCT: 31.6 % — ABNORMAL LOW (ref 35.0–45.0)
Hemoglobin: 9.3 g/dL — ABNORMAL LOW (ref 11.7–15.5)
LYMPHS ABS: 1092 {cells}/uL (ref 850–3900)
Lymphocytes Relative: 21 %
MCH: 21.3 pg — AB (ref 27.0–33.0)
MCHC: 29.4 g/dL — AB (ref 32.0–36.0)
MCV: 72.3 fL — ABNORMAL LOW (ref 80.0–100.0)
MONOS PCT: 9 %
MPV: 10.3 fL (ref 7.5–12.5)
Monocytes Absolute: 468 cells/uL (ref 200–950)
NEUTROS ABS: 3484 {cells}/uL (ref 1500–7800)
NEUTROS PCT: 67 %
PLATELETS: 342 10*3/uL (ref 140–400)
RBC: 4.37 MIL/uL (ref 3.80–5.10)
RDW: 14.8 % (ref 11.0–15.0)
WBC: 5.2 10*3/uL (ref 3.8–10.8)

## 2016-10-29 NOTE — Telephone Encounter (Signed)
2nd call to [facility], they state that their records show that the pt's insurance is still reviewing her case for coverage, Lupron case worker states that she will call Sheryl Carr today to help expedite the process.

## 2016-11-12 ENCOUNTER — Encounter: Payer: Self-pay | Admitting: Family Medicine

## 2016-11-12 ENCOUNTER — Ambulatory Visit (INDEPENDENT_AMBULATORY_CARE_PROVIDER_SITE_OTHER): Payer: Managed Care, Other (non HMO) | Admitting: Family Medicine

## 2016-11-12 VITALS — BP 135/80 | HR 75 | Temp 98.6°F | Resp 16 | Ht 68.0 in | Wt 152.0 lb

## 2016-11-12 DIAGNOSIS — Z0000 Encounter for general adult medical examination without abnormal findings: Secondary | ICD-10-CM

## 2016-11-12 DIAGNOSIS — D500 Iron deficiency anemia secondary to blood loss (chronic): Secondary | ICD-10-CM | POA: Diagnosis not present

## 2016-11-12 DIAGNOSIS — I10 Essential (primary) hypertension: Secondary | ICD-10-CM

## 2016-11-12 DIAGNOSIS — Z23 Encounter for immunization: Secondary | ICD-10-CM | POA: Diagnosis not present

## 2016-11-12 DIAGNOSIS — D649 Anemia, unspecified: Secondary | ICD-10-CM | POA: Insufficient documentation

## 2016-11-12 NOTE — Progress Notes (Signed)
Name: Sheryl Carr   MRN: [id number]    DOB: [birth year]   Date:11/12/2016       Progress Note  Subjective  Chief Complaint  Chief Complaint  Patient presents with  . Annual Exam  . Hypertension  . Fibroids  . Anemia    HPI Here for annual female health maintenance examination.  She has uterine fibroids with anemia.  Probably will need hysteretomy.  No problem-specific Assessment & Plan notes found for this encounter.   Past Medical History:  Diagnosis Date  . Anemia   . Chronic kidney disease    H/O KIDNEY STONES  . GERD (gastroesophageal reflux disease)   . H/O mammogram 05/11/2015  . HTN (hypertension)    PT WAS TAKEN OFF HER BP MED IN MAY/JUNE OF 2016 BUT BP IS ELEVATED AGAIN    Past Surgical History:  Procedure Laterality Date  . CESAREAN SECTION    . HYSTEROSCOPY W/D&C N/A 11/14/2015   Procedure: DILATATION AND CURETTAGE /HYSTEROSCOPY;  Surgeon: [name], MD;  Location: [facility] ORS;  Service: Gynecology;  Laterality: N/A;  . KIDNEY STONE SURGERY      Family History  Problem Relation Age of Onset  . Diabetes Mellitus I Mother   . Diabetes Mellitus I Maternal Grandmother   . Prostate cancer Maternal Grandfather   . Diabetes Mellitus I Paternal Grandmother     Social History   Social History  . Marital status: Married    Spouse name: Sheryl Carr  . Number of children: 2  . Years of education: college   Occupational History  . n/a   .  [place] History Main Topics  . Smoking status: Former Smoker    Quit date: 09/24/1990  . Smokeless tobacco: Never Used  . Alcohol use Yes     Comment: social  . Drug use: No  . Sexual activity: Yes    Birth control/ protection: None, Pill   Other Topics Concern  . Not on file   Social History Narrative   Patient is married Sheryl Carr) and lives at home with her husband and one child, one child is in college.   Patient is working full-time.   Patient has a college education.   Patient is  right-handed.   Patient drinks 1-2 cups of coffee daily, soda and tea are occasionally, not daily.     Current Outpatient Prescriptions:  .  ferrous sulfate 325 (65 FE) MG tablet, Take 325 mg by mouth daily with breakfast., Disp: , Rfl:  .  losartan (COZAAR) 50 MG tablet, Take 1 tablet (50 mg total) by mouth daily., Disp: 30 tablet, Rfl: 6 .  Multiple Vitamins-Calcium (ONE-A-DAY WOMENS FORMULA PO), Take by mouth., Disp: , Rfl:  .  omeprazole (PRILOSEC) 20 MG capsule, TAKE 1 CAPSULE BY MOUTH EVERY MORNING FOR GERD, Disp: 30 capsule, Rfl: 12  Allergies  Allergen Reactions  . Amoxicillin   . Pollen Extract     itchy eyes, dull headaches, runny nose  . Penicillins Rash     Review of Systems  Constitutional: Positive for malaise/fatigue. Negative for chills, fever and weight loss.  HENT: Negative for hearing loss and tinnitus.   Eyes: Negative for blurred vision and double vision.  Respiratory: Negative for cough, shortness of breath and wheezing.   Cardiovascular: Negative for chest pain, palpitations and leg swelling.  Gastrointestinal: Negative for abdominal pain, blood in stool, heartburn and nausea.  Genitourinary: Negative for dysuria, frequency and urgency.  Musculoskeletal: Negative for back pain and  myalgias.  Skin: Negative for rash.  Neurological: Negative for dizziness, tremors, weakness and headaches.      Objective  Vitals:   11/12/16 1415 11/12/16 1514  BP: (!) 149/74 135/80  Pulse: 75   Resp: 16   Temp: 98.6 F (37 C)   TempSrc: Oral   Weight: 152 lb (68.9 kg)   Height: _0  (1.727 m)     Physical Exam  Constitutional: She is oriented to person, place, and time. No distress.  HENT:  Head: Normocephalic and atraumatic.  Right Ear: External ear normal.  Left Ear: External ear normal.  Nose: Nose normal.  Mouth/Throat: Oropharynx is clear and moist.  Eyes: Conjunctivae and EOM are normal. Pupils are equal, round, and reactive to light.  Fundoscopic  exam:      The right eye shows no arteriolar narrowing, no AV nicking, no exudate, no hemorrhage and no papilledema.       The left eye shows no arteriolar narrowing, no AV nicking, no exudate, no hemorrhage and no papilledema.  Neck: Normal range of motion. Neck supple. Carotid bruit is not present. No thyromegaly present.  Cardiovascular: Normal rate, regular rhythm and normal heart sounds.  Exam reveals no gallop and no friction rub.   No murmur heard. Pulmonary/Chest: Effort normal and breath sounds normal. No respiratory distress. She has no wheezes. She has no rales.  Abdominal: Soft. She exhibits no distension and no mass. There is no tenderness.  Musculoskeletal: She exhibits no edema.  Lymphadenopathy:    She has no cervical adenopathy.  Neurological: She is oriented to person, place, and time. Gait normal.  Skin: Skin is warm and dry.  Psychiatric: Mood, memory, affect and judgment normal.  Vitals reviewed.      Recent Results (from the past 2160 hour(s))  COMPLETE METABOLIC PANEL WITH GFR     Status: None   Collection Time: 10/22/16 12:01 AM  Result Value Ref Range   Sodium 138 135 - 146 mmol/L   Potassium 4.5 3.5 - 5.3 mmol/L   Chloride 104 98 - 110 mmol/L   CO2 27 20 - 31 mmol/L   Glucose, Bld 96 65 - 99 mg/dL   BUN 12 7 - 25 mg/dL   Creat 0.60 0.50 - 1.10 mg/dL   Total Bilirubin 0.5 0.2 - 1.2 mg/dL   Alkaline Phosphatase 47 33 - 115 U/L   AST 11 10 - 30 U/L   ALT 8 6 - 29 U/L   Total Protein 6.4 6.1 - 8.1 g/dL   Albumin 4.0 3.6 - 5.1 g/dL   Calcium 8.9 8.6 - 10.2 mg/dL   GFR, Est African American >89 >=60 mL/min   GFR, Est Non African American >89 >=60 mL/min  CBC with Differential     Status: Abnormal   Collection Time: 10/22/16 12:01 AM  Result Value Ref Range   WBC 5.2 3.8 - 10.8 K/uL   RBC 4.37 3.80 - 5.10 MIL/uL   Hemoglobin 9.3 (L) 11.7 - 15.5 g/dL   HCT 31.6 (L) 35.0 - 45.0 %   MCV 72.3 (L) 80.0 - 100.0 fL   MCH 21.3 (L) 27.0 - 33.0 pg   MCHC 29.4  (L) 32.0 - 36.0 g/dL   RDW 14.8 11.0 - 15.0 %   Platelets 342 140 - 400 K/uL   MPV 10.3 7.5 - 12.5 fL   Neutro Abs 3,484 1,500 - 7,800 cells/uL   Lymphs Abs 1,092 850 - 3,900 cells/uL   Monocytes Absolute 468 200 -  950 cells/uL   Eosinophils Absolute 104 15 - 500 cells/uL   Basophils Absolute 52 0 - 200 cells/uL   Neutrophils Relative % 67 %   Lymphocytes Relative 21 %   Monocytes Relative 9 %   Eosinophils Relative 2 %   Basophils Relative 1 %   Smear Review Criteria for review not met   Lipid Profile     Status: Abnormal   Collection Time: 10/22/16 12:01 AM  Result Value Ref Range   Cholesterol 145 <200 mg/dL    Comment: ** Please note change in reference range(s). **      Triglycerides 56 <150 mg/dL    Comment: ** Please note change in reference range(s). **      HDL 45 (L) >50 mg/dL    Comment: ** Please note change in reference range(s). **      Total CHOL/HDL Ratio 3.2 <5.0 Ratio   VLDL 11 <30 mg/dL   LDL Cholesterol 89 <100 mg/dL    Comment: ** Please note change in reference range(s). **        Assessment & Plan  Problem List Items Addressed This Visit      Cardiovascular and Mediastinum   Essential hypertension     Other   Anemia   Relevant Medications   ferrous sulfate 325 (65 FE) MG tablet    Other Visit Diagnoses    Need for immunization against influenza    -  Primary   Relevant Orders   Flu Vaccine QUAD 36+ mos PF IM (Fluarix & Fluzone Quad PF) (Completed)   Health maintenance examination          Meds ordered this encounter  Medications  . ferrous sulfate 325 (65 FE) MG tablet    Sig: Take 325 mg by mouth daily with breakfast.   1. Need for immunization against influenza  - Flu Vaccine QUAD 36+ mos PF IM (Fluarix & Fluzone Quad PF)  2. Health maintenance examination   3. Essential hypertension Cont Losartan  4. Iron deficiency anemia due to chronic blood loss Cont OTC FeSO4, 2/d

## 2016-11-12 NOTE — Patient Instructions (Signed)
Plan CBC on return

## 2017-01-10 ENCOUNTER — Ambulatory Visit: Payer: Managed Care, Other (non HMO) | Admitting: Family Medicine

## 2017-01-11 ENCOUNTER — Encounter: Payer: Self-pay | Admitting: Family Medicine

## 2017-01-11 ENCOUNTER — Ambulatory Visit (INDEPENDENT_AMBULATORY_CARE_PROVIDER_SITE_OTHER): Payer: Managed Care, Other (non HMO) | Admitting: Family Medicine

## 2017-01-11 VITALS — BP 120/80 | HR 79 | Temp 98.2°F | Resp 16 | Ht 68.0 in | Wt 155.0 lb

## 2017-01-11 DIAGNOSIS — D500 Iron deficiency anemia secondary to blood loss (chronic): Secondary | ICD-10-CM | POA: Diagnosis not present

## 2017-01-11 DIAGNOSIS — K219 Gastro-esophageal reflux disease without esophagitis: Secondary | ICD-10-CM

## 2017-01-11 DIAGNOSIS — I10 Essential (primary) hypertension: Secondary | ICD-10-CM

## 2017-01-11 LAB — CBC
HCT: 37 % (ref 35.0–45.0)
Hemoglobin: 12.1 g/dL (ref 11.7–15.5)
MCH: 28.1 pg (ref 27.0–33.0)
MCHC: 32.7 g/dL (ref 32.0–36.0)
MCV: 86 fL (ref 80.0–100.0)
MPV: 9.4 fL (ref 7.5–12.5)
PLATELETS: 318 10*3/uL (ref 140–400)
RBC: 4.3 MIL/uL (ref 3.80–5.10)
RDW: 15.1 % — ABNORMAL HIGH (ref 11.0–15.0)
WBC: 6.1 10*3/uL (ref 3.8–10.8)

## 2017-01-11 MED ORDER — LOSARTAN POTASSIUM 50 MG PO TABS: 50.0000 mg | ORAL_TABLET | Freq: Every day | ORAL | 12 refills | Status: DC

## 2017-01-11 MED ORDER — OMEPRAZOLE 20 MG PO CPDR: 20.0000 mg | DELAYED_RELEASE_CAPSULE | Freq: Two times a day (BID) | ORAL | 12 refills | Status: DC

## 2017-01-11 NOTE — Progress Notes (Signed)
Name: Sheryl Carr   MRN: [id number]    DOB: Apr 29, 1971   Date:01/11/2017       Progress Note  Subjective  Chief Complaint  Chief Complaint  Patient presents with  . Hypertension    follow up   . Gastroesophageal Reflux    HPI Her for f/u of HBP.  She is taking meds and having no problems with it.  She does c/o some continued GERD sx, even taking Prilosec once every days.  No problem-specific Assessment & Plan notes found for this encounter.   Past Medical History:  Diagnosis Date  . Anemia   . Chronic kidney disease    H/O KIDNEY STONES  . GERD (gastroesophageal reflux disease)   . H/O mammogram 05/11/2015  . HTN (hypertension)    PT WAS TAKEN OFF HER BP MED IN MAY/JUNE OF 2016 BUT BP IS ELEVATED AGAIN    Past Surgical History:  Procedure Laterality Date  . CESAREAN SECTION    . HYSTEROSCOPY W/D&C N/A 11/14/2015   Procedure: DILATATION AND CURETTAGE /HYSTEROSCOPY;  Surgeon: [name], MD;  Location: [facility] ORS;  Service: Gynecology;  Laterality: N/A;  . KIDNEY STONE SURGERY      Family History  Problem Relation Age of Onset  . Diabetes Mellitus I Mother   . Diabetes Mellitus I Maternal Grandmother   . Prostate cancer Maternal Grandfather   . Diabetes Mellitus I Paternal Grandmother     Social History   Social History  . Marital status: Married    Spouse name: [name]  . Number of children: 2  . Years of education: college   Occupational History  . n/a   .  [place] History Main Topics  . Smoking status: Former Smoker    Quit date: 09/24/1990  . Smokeless tobacco: Never Used  . Alcohol use Yes     Comment: social  . Drug use: No  . Sexual activity: Yes    Birth control/ protection: None, Pill   Other Topics Concern  . Not on file   Social History Narrative   Patient is married [name]) and lives at home with her husband and one child, one child is in college.   Patient is working full-time.   Patient has a college education.    Patient is right-handed.   Patient drinks 1-2 cups of coffee daily, soda and tea are occasionally, not daily.     Current Outpatient Prescriptions:  .  ferrous sulfate 325 (65 FE) MG tablet, Take 325 mg by mouth daily with breakfast., Disp: , Rfl:  .  losartan (COZAAR) 50 MG tablet, Take 1 tablet (50 mg total) by mouth daily., Disp: 30 tablet, Rfl: 12 .  Multiple Vitamins-Calcium (ONE-A-DAY WOMENS FORMULA PO), Take by mouth., Disp: , Rfl:  .  omeprazole (PRILOSEC) 20 MG capsule, Take 1 capsule (20 mg total) by mouth 2 (two) times daily before a meal., Disp: 60 capsule, Rfl: 12  Allergies  Allergen Reactions  . Amoxicillin   . Pollen Extract     itchy eyes, dull headaches, runny nose  . Penicillins Rash     Review of Systems  Constitutional: Negative for chills, fever, malaise/fatigue and weight loss.  HENT: Negative for hearing loss and tinnitus.   Eyes: Negative for blurred vision and double vision.  Respiratory: Negative for cough, shortness of breath and wheezing.   Cardiovascular: Negative for chest pain, palpitations and leg swelling.  Gastrointestinal: Positive for heartburn. Negative for abdominal pain and blood in  stool.  Genitourinary: Negative for dysuria, frequency and urgency.  Musculoskeletal: Negative for joint pain and myalgias.  Skin: Negative for rash.  Neurological: Negative for dizziness, tingling, tremors, weakness and headaches.      Objective  Vitals:   01/11/17 0909 01/11/17 0957  BP: 129/90 120/80  Pulse: 79   Resp: 16   Temp: 98.2 F (36.8 C)   TempSrc: Oral   Weight: 155 lb (70.3 kg)   Height: '5\' 8"'  (1.727 m)     Physical Exam  Constitutional: She is well-developed, well-nourished, and in no distress. No distress.  HENT:  Head: Normocephalic and atraumatic.  Eyes: Conjunctivae and EOM are normal. Pupils are equal, round, and reactive to light. No scleral icterus.  Neck: Normal range of motion. Neck supple. Carotid bruit is not present.  No thyromegaly present.  Cardiovascular: Normal rate, regular rhythm and normal heart sounds.  Exam reveals no gallop and no friction rub.   No murmur heard. Pulmonary/Chest: Effort normal and breath sounds normal. No respiratory distress. She has no wheezes. She has no rales.  Abdominal: Soft. Bowel sounds are normal. She exhibits no distension and no mass. There is no tenderness.  Musculoskeletal: She exhibits no edema.  Lymphadenopathy:    She has no cervical adenopathy.  Neurological: She is alert.  Vitals reviewed.      Recent Results (from the past 2160 hour(s))  COMPLETE METABOLIC PANEL WITH GFR     Status: None   Collection Time: 10/22/16 12:01 AM  Result Value Ref Range   Sodium 138 135 - 146 mmol/L   Potassium 4.5 3.5 - 5.3 mmol/L   Chloride 104 98 - 110 mmol/L   CO2 27 20 - 31 mmol/L   Glucose, Bld 96 65 - 99 mg/dL   BUN 12 7 - 25 mg/dL   Creat 0.60 0.50 - 1.10 mg/dL   Total Bilirubin 0.5 0.2 - 1.2 mg/dL   Alkaline Phosphatase 47 33 - 115 U/L   AST 11 10 - 30 U/L   ALT 8 6 - 29 U/L   Total Protein 6.4 6.1 - 8.1 g/dL   Albumin 4.0 3.6 - 5.1 g/dL   Calcium 8.9 8.6 - 10.2 mg/dL   GFR, Est African American >89 >=60 mL/min   GFR, Est Non African American >89 >=60 mL/min  CBC with Differential     Status: Abnormal   Collection Time: 10/22/16 12:01 AM  Result Value Ref Range   WBC 5.2 3.8 - 10.8 K/uL   RBC 4.37 3.80 - 5.10 MIL/uL   Hemoglobin 9.3 (L) 11.7 - 15.5 g/dL   HCT 31.6 (L) 35.0 - 45.0 %   MCV 72.3 (L) 80.0 - 100.0 fL   MCH 21.3 (L) 27.0 - 33.0 pg   MCHC 29.4 (L) 32.0 - 36.0 g/dL   RDW 14.8 11.0 - 15.0 %   Platelets 342 140 - 400 K/uL   MPV 10.3 7.5 - 12.5 fL   Neutro Abs 3,484 1,500 - 7,800 cells/uL   Lymphs Abs 1,092 850 - 3,900 cells/uL   Monocytes Absolute 468 200 - 950 cells/uL   Eosinophils Absolute 104 15 - 500 cells/uL   Basophils Absolute 52 0 - 200 cells/uL   Neutrophils Relative % 67 %   Lymphocytes Relative 21 %   Monocytes Relative 9 %    Eosinophils Relative 2 %   Basophils Relative 1 %   Smear Review Criteria for review not met   Lipid Profile     Status: Abnormal  Collection Time: 10/22/16 12:01 AM  Result Value Ref Range   Cholesterol 145 <200 mg/dL    Comment: ** Please note change in reference range(s). **      Triglycerides 56 <150 mg/dL    Comment: ** Please note change in reference range(s). **      HDL 45 (L) >50 mg/dL    Comment: ** Please note change in reference range(s). **      Total CHOL/HDL Ratio 3.2 <5.0 Ratio   VLDL 11 <30 mg/dL   LDL Cholesterol 89 <100 mg/dL    Comment: ** Please note change in reference range(s). **        Assessment & Plan  Problem List Items Addressed This Visit      Cardiovascular and Mediastinum   Essential (primary) hypertension   Relevant Medications   losartan (COZAAR) 50 MG tablet   Essential hypertension - Primary   Relevant Medications   losartan (COZAAR) 50 MG tablet     Digestive   GERD (gastroesophageal reflux disease)   Relevant Medications   omeprazole (PRILOSEC) 20 MG capsule     Other   Anemia   Relevant Orders   CBC      Meds ordered this encounter  Medications  . omeprazole (PRILOSEC) 20 MG capsule    Sig: Take 1 capsule (20 mg total) by mouth 2 (two) times daily before a meal.    Dispense:  60 capsule    Refill:  12  . losartan (COZAAR) 50 MG tablet    Sig: Take 1 tablet (50 mg total) by mouth daily.    Dispense:  30 tablet    Refill:  12   1. Essential hypertension   2. Gastroesophageal reflux disease without esophagitis  - omeprazole (PRILOSEC) 20 MG capsule; Take 1 capsule (20 mg total) by mouth 2 (two) times daily before a meal.  Dispense: 60 capsule; Refill: 12 - increased from 1 daily  3. Essential (primary) hypertension  - losartan (COZAAR) 50 MG tablet; Take 1 tablet (50 mg total) by mouth daily.  Dispense: 30 tablet; Refill: 12  4. Iron deficiency anemia due to chronic blood loss Cont FeSO4, 1 twice a  day. - CBC

## 2017-01-30 ENCOUNTER — Encounter: Payer: Self-pay | Admitting: Obstetrics and Gynecology

## 2017-01-30 ENCOUNTER — Ambulatory Visit (INDEPENDENT_AMBULATORY_CARE_PROVIDER_SITE_OTHER): Payer: Managed Care, Other (non HMO) | Admitting: Obstetrics and Gynecology

## 2017-01-30 VITALS — BP 120/71 | HR 78 | Ht 68.0 in | Wt 152.6 lb

## 2017-01-30 DIAGNOSIS — N920 Excessive and frequent menstruation with regular cycle: Secondary | ICD-10-CM

## 2017-01-30 DIAGNOSIS — D259 Leiomyoma of uterus, unspecified: Secondary | ICD-10-CM

## 2017-01-30 NOTE — Progress Notes (Signed)
    GYNECOLOGY PROGRESS NOTE  Subjective:    Patient ID: Sheryl Carr, female    DOB: 04-May-1971, 46 y.o.   MRN: [id number]  HPI  Patient is a 46 y.o. [id number] female who presents for disucssion of hysterectomy.  Patient with a h/o fibroid uterus and menorrhagia.  Was previously on progesterone-only OCPs, however notes that she was taken off of these by her PCP several months ago due to elevations in her BP.  Patient now notes that she thinks she is ready to proceed with definitive surgical management.   The following portions of the patient's history were reviewed and updated as appropriate: allergies, current medications, past family history, past medical history, past social history, past surgical history and problem list.  Review of Systems Pertinent items noted in HPI and remainder of comprehensive ROS otherwise negative.   Objective:   Blood pressure 120/71, pulse 78, height 5\' 8"  (1.727 m), weight 152 lb 9.6 oz (69.2 kg), last menstrual period 01/26/2017. General appearance: alert and no distress Exam deferred.    Assessment:   Fibroid uterus Menorrhagia  Plan:   Patient desires definitive management with hysterectomy.  I proposed doing a total abdominal hysterectomy (TAH) with prophylactic bilateral salpingectomy (based on size of uterine fibroid).  The risks of surgery were discussed in detail with the patient including but not limited to: bleeding which may require transfusion or reoperation; infection which may require antibiotics; injury to bowel, bladder, ureters or other surrounding organs; need for additional procedures including laparotomy; thromboembolic phenomenon, incisional problems and other postoperative/anesthesia complications.  Patient was also advised that she will remain in house for 1-2 nights; and expected recovery time after a hysterectomy is 6-8 weeks.  Likelihood of success in alleviating the patient's symptoms was discussed.  After discussion,  patient notes that she did not realize the length of the recovery period.  Questions if there is a difference if it were able to be performed laparoscopically.  I noted that inpatient recovery would be less, and that some return to work as soon as 4 weeks after laparoscopic surgery.  Discussed option of using Lupron to decrease symptoms of menorrhagia as well as possibly decreasing the fibroid size to the point of being able to convert to a laparoscopic procedure.  Patient desires more information. Given handouts to review.  Patient to notify office once decision has been made.    A total of 15 minutes were spent face-to-face with the patient during this encounter and over half of that time dealt with counseling and coordination of care.    [name], MD [facility]

## 2017-05-17 ENCOUNTER — Ambulatory Visit: Payer: Managed Care, Other (non HMO) | Admitting: Family Medicine

## 2017-05-20 ENCOUNTER — Encounter: Payer: Self-pay | Admitting: Family Medicine

## 2017-05-20 ENCOUNTER — Ambulatory Visit (INDEPENDENT_AMBULATORY_CARE_PROVIDER_SITE_OTHER): Payer: Managed Care, Other (non HMO) | Admitting: Family Medicine

## 2017-05-20 VITALS — BP 128/82 | HR 75 | Temp 98.2°F | Resp 16 | Ht 68.0 in | Wt 149.0 lb

## 2017-05-20 DIAGNOSIS — D251 Intramural leiomyoma of uterus: Secondary | ICD-10-CM | POA: Diagnosis not present

## 2017-05-20 DIAGNOSIS — N920 Excessive and frequent menstruation with regular cycle: Secondary | ICD-10-CM | POA: Diagnosis not present

## 2017-05-20 DIAGNOSIS — I10 Essential (primary) hypertension: Secondary | ICD-10-CM | POA: Diagnosis not present

## 2017-05-20 NOTE — Assessment & Plan Note (Addendum)
Well-controlled HTN - Home BP readings previously normal, infrequent  Complication with prior OCPs raising BP in past, now off OCP   Plan:  1. Continue current BP regimen - Losartan 50mg  daily - likely need to continue indefinitely for now, consider future lower dose to 25mg  daily if continues to improve lifestyle and remain at healthy wt - No refill on Losartan today - she has rx at local pharmacy for 30 day supply +12 refills through 01/2018 - asked to check with [facility] if needs mail order 90 day sent in, notify us if want refill or change 2. Encourage improved lifestyle - check food labels for low sodium diet, start regular exercise 3. Continue monitor BP outside office PRN, bring readings to next visit, if persistently >140/90 or new symptoms notify office sooner 4. Follow-up 6 months for HTN follow-up / consider Annual Physical or can schedule through GYN for Well Woman Exam with labs, due in 10/2017

## 2017-05-20 NOTE — Patient Instructions (Addendum)
Thank you for coming to the clinic today.  1. Plan to continue Losartan 50mg  indefinitely for now - last rx refilled by Dr Luan Pulling 01/2017, for monthly supply for 1 year  Please check with [facility] or mail order to see if they can do 90 day supply, let me know or let them to request new rx on this, otherwise we can send it again through [facility] if you request it  You are due for Annual Physical + fasting blood work anytime after mid November 2018 - discuss with Dr Marcelline Mates to see if they can do all of this for you / preventative wellness exam. Otherwise if you want [place] to do Annual Physical and labs instead, contact our office and notify me to get this ordered/scheduled.  If they do preventative visit, then we can just follow-up every 6 months for now for check-up and BP check  Please schedule a Follow-up Appointment to: Return in about 6 months (around 11/19/2017) for blood pressure.  If you have any other questions or concerns, please feel free to call the clinic or send a message through [place]. You may also schedule an earlier appointment if necessary.  Nobie Putnam, DO [place]

## 2017-05-20 NOTE — Assessment & Plan Note (Addendum)
Chronic problem secondary to fibroid and abnormal uterine bleeding Off OCPs due to HTN worsening Continues iron supplement and MVI daily Followed by GYN [facility] Dr Marcelline Mates, follow-up Pre-OP for hysterectomy in 07/2017

## 2017-05-20 NOTE — Assessment & Plan Note (Signed)
Likely contributing factor to menorrhagia, abnormal uterine bleeding Followed by GYN [facility] Dr Marcelline Mates, follow-up Pre-OP for hysterectomy in 07/2017

## 2017-05-20 NOTE — Progress Notes (Signed)
Subjective:    Patient ID: Sheryl Carr, female    DOB: August 14, 1971, 46 y.o.   MRN: [id number]  Sheryl Carr is a 46 y.o. female presenting on 05/20/2017 for Hypertension   HPI   FOLLOW-UP CHRONIC HTN: Reviews her prior history of gestational HTN and did not resolve in 2005, was on anti-HTN for while, then improved, and more recent history within past several years she was able to wean off anti-HTN therapy, until within past >1 year she had worsening of BP more subacutely after starting OCP, see below, which was DC'd and she was started on ARB. - Checks BP rarely at home, only if doesn't feel well, not routinely checking, previously had been normal when did routine checks Current Meds - Losartan 50mg  daily - Previously on Lisinopril lower doses, no reaction or side effect. Maybe one other BP med tried years ago. Reports good compliance, took meds today. Tolerating well, w/o complaints. Lifestyle: - Diet: Balanced diet, no particular changes, no salt added, but does not always check food labels for sodium % - Exercise: No regular exercise but plans to start in future. - Down 6 lbs wt in 4 months Denies CP, dyspnea, HA, edema, dizziness / lightheadedness  Menorrhagia / Abnormal Menstrual Bleeding: - Reviews prior history of heavy / abnormal menstrual bleeding for years, and history of fibroid uterus, had tried OCPs among other therapy with mixed results some improved bleeding but then complication with elevated HTN, she was discontinued on OCPs within past 1 year and now followed by OB/GYN [facility] Dr [name], last seen 01/2017 with discussion on hysterectomy and scheduled for Pre-op evaluation in 07/2017 for more definitive surgical management.  Social History  Substance Use Topics  . Smoking status: Former Smoker    Quit date: 09/24/1990  . Smokeless tobacco: Never Used  . Alcohol use Yes     Comment: social    Review of Systems Per HPI unless  specifically indicated above     Objective:    BP 128/82   Pulse 75   Temp 98.2 F (36.8 C) (Oral)   Resp 16   Ht 5\' 8"  (1.727 m)   Wt 149 lb (67.6 kg)   BMI 22.66 kg/m   Wt Readings from Last 3 Encounters:  05/20/17 149 lb (67.6 kg)  01/30/17 152 lb 9.6 oz (69.2 kg)  01/11/17 155 lb (70.3 kg)    Physical Exam  Constitutional: She is oriented to person, place, and time. She appears well-developed and well-nourished. No distress.  Well-appearing, comfortable, cooperative  HENT:  Head: Normocephalic and atraumatic.  Mouth/Throat: Oropharynx is clear and moist.  Eyes: Conjunctivae are normal.  Neck: Normal range of motion. Neck supple.  Cardiovascular: Normal rate, regular rhythm, normal heart sounds and intact distal pulses.   No murmur heard. Pulmonary/Chest: Effort normal.  Musculoskeletal: She exhibits no edema.  Neurological: She is alert and oriented to person, place, and time.  Skin: Skin is warm and dry. No rash noted. She is not diaphoretic. No erythema.  Psychiatric: She has a normal mood and affect. Her behavior is normal.  Well groomed, good eye contact, normal speech and thoughts  Nursing note and vitals reviewed.  Results for orders placed or performed in visit on 01/11/17  CBC  Result Value Ref Range   WBC 6.1 3.8 - 10.8 K/uL   RBC 4.30 3.80 - 5.10 MIL/uL   Hemoglobin 12.1 11.7 - 15.5 g/dL   HCT 37.0 35.0 - 45.0 %  MCV 86.0 80.0 - 100.0 fL   MCH 28.1 27.0 - 33.0 pg   MCHC 32.7 32.0 - 36.0 g/dL   RDW 15.1 (H) 11.0 - 15.0 %   Platelets 318 140 - 400 K/uL   MPV 9.4 7.5 - 12.5 fL      Assessment & Plan:   Problem List Items Addressed This Visit    Menorrhagia with regular cycle    Chronic problem secondary to fibroid and abnormal uterine bleeding Off OCPs due to HTN worsening Continues iron supplement and MVI daily Followed by GYN [facility] Dr Marcelline Mates, follow-up Pre-OP for hysterectomy in 07/2017      Intramural leiomyoma of uterus    Likely  contributing factor to menorrhagia, abnormal uterine bleeding Followed by GYN [facility] Dr Marcelline Mates, follow-up Pre-OP for hysterectomy in 07/2017      Essential hypertension - Primary    Well-controlled HTN - Home BP readings previously normal, infrequent  Complication with prior OCPs raising BP in past, now off OCP   Plan:  1. Continue current BP regimen - Losartan 50mg  daily - likely need to continue indefinitely for now, consider future lower dose to 25mg  daily if continues to improve lifestyle and remain at healthy wt - No refill on Losartan today - she has rx at local pharmacy for 30 day supply +12 refills through 01/2018 - asked to check with [facility] if needs mail order 90 day sent in, notify us if want refill or change 2. Encourage improved lifestyle - check food labels for low sodium diet, start regular exercise 3. Continue monitor BP outside office PRN, bring readings to next visit, if persistently >140/90 or new symptoms notify office sooner 4. Follow-up 6 months for HTN follow-up / consider Annual Physical or can schedule through GYN for Well Woman Exam with labs, due in 10/2017         No orders of the defined types were placed in this encounter.   Follow up plan: Return in about 6 months (around 11/19/2017) for blood pressure.  Nobie Putnam, [facility] 05/20/2017, 12:53 PM

## 2017-06-20 ENCOUNTER — Telehealth: Payer: Self-pay | Admitting: Obstetrics and Gynecology

## 2017-06-20 NOTE — Telephone Encounter (Signed)
Lvm for patient to reschedule appointment, Patient is aware that Dr. Marcelline Mates will not be in the office 07/18/2017 via vm. Thank you.

## 2017-06-25 ENCOUNTER — Emergency Department
Admission: EM | Admit: 2017-06-25 | Discharge: 2017-06-25 | Disposition: A | Discharge status: Home / Self Care | Payer: Managed Care, Other (non HMO) | Attending: Emergency Medicine | Admitting: Emergency Medicine

## 2017-06-25 ENCOUNTER — Emergency Department: Payer: Managed Care, Other (non HMO)

## 2017-06-25 ENCOUNTER — Encounter: Payer: Self-pay | Admitting: Emergency Medicine

## 2017-06-25 DIAGNOSIS — N189 Chronic kidney disease, unspecified: Secondary | ICD-10-CM | POA: Diagnosis not present

## 2017-06-25 DIAGNOSIS — I129 Hypertensive chronic kidney disease with stage 1 through stage 4 chronic kidney disease, or unspecified chronic kidney disease: Secondary | ICD-10-CM | POA: Diagnosis not present

## 2017-06-25 DIAGNOSIS — Z87891 Personal history of nicotine dependence: Secondary | ICD-10-CM | POA: Diagnosis not present

## 2017-06-25 DIAGNOSIS — R319 Hematuria, unspecified: Secondary | ICD-10-CM | POA: Diagnosis not present

## 2017-06-25 DIAGNOSIS — Z79899 Other long term (current) drug therapy: Secondary | ICD-10-CM | POA: Diagnosis not present

## 2017-06-25 DIAGNOSIS — R350 Frequency of micturition: Secondary | ICD-10-CM | POA: Diagnosis present

## 2017-06-25 DIAGNOSIS — R1030 Lower abdominal pain, unspecified: Secondary | ICD-10-CM | POA: Diagnosis not present

## 2017-06-25 LAB — COMPREHENSIVE METABOLIC PANEL
ALT: 19 U/L (ref 14–54)
AST: 23 U/L (ref 15–41)
Albumin: 4 g/dL (ref 3.5–5.0)
Alkaline Phosphatase: 50 U/L (ref 38–126)
Anion gap: 8 (ref 5–15)
BUN: 14 mg/dL (ref 6–20)
CHLORIDE: 104 mmol/L (ref 101–111)
CO2: 26 mmol/L (ref 22–32)
Calcium: 9.3 mg/dL (ref 8.9–10.3)
Creatinine, Ser: 0.96 mg/dL (ref 0.44–1.00)
Glucose, Bld: 141 mg/dL — ABNORMAL HIGH (ref 65–99)
POTASSIUM: 3.2 mmol/L — AB (ref 3.5–5.1)
SODIUM: 138 mmol/L (ref 135–145)
Total Bilirubin: 0.4 mg/dL (ref 0.3–1.2)
Total Protein: 6.8 g/dL (ref 6.5–8.1)

## 2017-06-25 LAB — URINALYSIS, COMPLETE (UACMP) WITH MICROSCOPIC: SPECIFIC GRAVITY, URINE: 1.003 — AB (ref 1.005–1.030)

## 2017-06-25 LAB — CBC
HCT: 34.9 % — ABNORMAL LOW (ref 35.0–47.0)
Hemoglobin: 11.7 g/dL — ABNORMAL LOW (ref 12.0–16.0)
MCH: 28.4 pg (ref 26.0–34.0)
MCHC: 33.5 g/dL (ref 32.0–36.0)
MCV: 84.8 fL (ref 80.0–100.0)
PLATELETS: 297 10*3/uL (ref 150–440)
RBC: 4.11 MIL/uL (ref 3.80–5.20)
RDW: 12.9 % (ref 11.5–14.5)
WBC: 11 10*3/uL (ref 3.6–11.0)

## 2017-06-25 LAB — PREGNANCY, URINE: PREG TEST UR: NEGATIVE

## 2017-06-25 MED ORDER — KETOROLAC TROMETHAMINE 30 MG/ML IJ SOLN
30.0000 mg | Freq: Once | INTRAMUSCULAR | Status: AC
  Administered 2017-06-25: 30 mg via INTRAVENOUS
  Filled 2017-06-25: qty 1

## 2017-06-25 MED ORDER — CIPROFLOXACIN HCL 250 MG PO TABS: 250.0000 mg | ORAL_TABLET | Freq: Two times a day (BID) | ORAL | 0 refills | Status: AC

## 2017-06-25 MED ORDER — PHENAZOPYRIDINE HCL 200 MG PO TABS: 200.0000 mg | ORAL_TABLET | Freq: Three times a day (TID) | ORAL | 0 refills | Status: DC | PRN

## 2017-06-25 MED ORDER — PHENAZOPYRIDINE HCL 200 MG PO TABS
200.0000 mg | ORAL_TABLET | Freq: Once | ORAL | Status: AC
  Administered 2017-06-25: 200 mg via ORAL
  Filled 2017-06-25: qty 1

## 2017-06-25 MED ORDER — CIPROFLOXACIN HCL 500 MG PO TABS
500.0000 mg | ORAL_TABLET | Freq: Once | ORAL | Status: AC
  Administered 2017-06-25: 500 mg via ORAL
  Filled 2017-06-25: qty 1

## 2017-06-25 MED ORDER — SODIUM CHLORIDE 0.9 % IV BOLUS (SEPSIS)
1000.0000 mL | Freq: Once | INTRAVENOUS | Status: AC
  Administered 2017-06-25: 1000 mL via INTRAVENOUS

## 2017-06-25 NOTE — ED Triage Notes (Signed)
Pt to triage in wheelchair. Pt c/o urinary frequency, burning and hematuria with clots since 1500 today. No fever noted, pt is tachycardiac at 113 in triage.

## 2017-06-25 NOTE — ED Notes (Signed)
Patient taken to CT scan.

## 2017-06-25 NOTE — ED Provider Notes (Signed)
[facility] Emergency Department Provider Note  Time seen: 8:02 PM  I have reviewed the triage vital signs and the nursing notes.   HISTORY  Chief Complaint Urinary Frequency and Hematuria    HPI Sheryl Carr is a 46 y.o. female with a history of kidney stones who presents to the emergency department with hematuria. According to the patient ran 3 PM today she developed blood in her urine. Denies any history of hematuria in the past. States some burning upon urination and lower abdominal pain but has a history of fibroids and states she always has some amount of lower abdominal pain. Denies any pain in her back. Denies any fever, nausea, vomiting or diarrhea. Denies any other bleeding. Denies any use of blood thinners.  Past Medical History:  Diagnosis Date  . Anemia   . Chronic kidney disease    H/O KIDNEY STONES  . GERD (gastroesophageal reflux disease)   . H/O mammogram 05/11/2015  . HTN (hypertension)    PT WAS TAKEN OFF HER BP MED IN MAY/JUNE OF 2016 BUT BP IS ELEVATED AGAIN    Patient Active Problem List   Diagnosis Date Noted  . Anemia 11/12/2016  . Essential hypertension 11/10/2015  . Endometrial polyp 11/10/2015  . Menorrhagia with regular cycle 11/10/2015  . Intramural leiomyoma of uterus 11/10/2015  . Annual physical exam-female 08/03/2015  . GERD (gastroesophageal reflux disease) 08/03/2015  . Abdominal pain 05/23/2015  . Anxiety 05/23/2015  . Allergic contact dermatitis 05/23/2015  . Elevated LDL cholesterol level 05/23/2015  . Fatigue 05/23/2015  . Brash 05/23/2015  . Breast lump 05/23/2015  . Muscle spasms of neck 05/23/2015  . Neuropathy 05/23/2015  . Pharyngeal inflammation 05/23/2015  . Skin lesion 05/23/2015  . Pain in limb 09/24/2013  . Numbness 09/24/2013    Past Surgical History:  Procedure Laterality Date  . CESAREAN SECTION    . HYSTEROSCOPY W/D&C N/A 11/14/2015   Procedure: DILATATION AND CURETTAGE  /HYSTEROSCOPY;  Surgeon: [name], MD;  Location: [facility] ORS;  Service: Gynecology;  Laterality: N/A;  . KIDNEY STONE SURGERY      Prior to Admission medications   Medication Sig Start Date End Date Taking? Authorizing Provider  econazole nitrate 1 % cream APP AA ONCE OR TWICE D UTD 01/15/17   [provider]  ferrous sulfate 325 (65 FE) MG tablet Take 325 mg by mouth daily with breakfast.    [provider]  losartan (COZAAR) 50 MG tablet Take 1 tablet (50 mg total) by mouth daily. 01/11/17   Arlis Porta., MD  Multiple Vitamins-Calcium (ONE-A-DAY WOMENS FORMULA PO) Take by mouth.    [provider]  omeprazole (PRILOSEC) 20 MG capsule Take 1 capsule (20 mg total) by mouth 2 (two) times daily before a meal. 01/11/17   Luan Pulling Ronelle Nigh., MD    Allergies  Allergen Reactions  . Amoxicillin   . Pollen Extract     itchy eyes, dull headaches, runny nose  . Penicillins Rash    Family History  Problem Relation Age of Onset  . Diabetes Mellitus I Mother   . Diabetes Mellitus I Maternal Grandmother   . Prostate cancer Maternal Grandfather   . Diabetes Mellitus I Paternal Grandmother     Social History Social History  Substance Use Topics  . Smoking status: Former Smoker    Quit date: 09/24/1990  . Smokeless tobacco: Never Used  . Alcohol use Yes     Comment: social  Review of Systems Constitutional: Negative for fever. Cardiovascular: Negative for chest pain. Respiratory: Negative for shortness of breath. Gastrointestinal:Mild lower abdominal pain, unchanged from baseline. Negative for nausea vomiting or diarrhea Genitourinary: Moderate dysuria. Positive for hematuria. Musculoskeletal: Negative for back pain Neurological: Negative for headache All other ROS negative  ____________________________________________   PHYSICAL EXAM:  VITAL SIGNS: ED Triage Vitals  Enc Vitals Group     BP 06/25/17 1937 (!) 155/89     Pulse Rate 06/25/17  1930 (!) 113     Resp 06/25/17 1930 17     Temp 06/25/17 1930 97.9 F (36.6 C)     Temp Source 06/25/17 1930 Oral     SpO2 06/25/17 1930 100 %     Weight 06/25/17 1930 149 lb (67.6 kg)     Height 06/25/17 1930 5\' 9"  (1.753 m)     Head Circumference --      Peak Flow --      Pain Score 06/25/17 2000 6     Pain Loc --      Pain Edu? --      Excl. in [place]? --     Constitutional: Alert and oriented. Well appearing and in no distress. Eyes: Normal exam ENT   Head: Normocephalic and atraumatic.   Mouth/Throat: Mucous membranes are moist. Cardiovascular: Normal rate, regular rhythm. No murmur Respiratory: Normal respiratory effort without tachypnea nor retractions. Breath sounds are clear  Gastrointestinal: Soft, moderate suprapubic tenderness to palpation. No rebound or guarding. No distention. No CVA tenderness. Musculoskeletal: Nontender with normal range of motion in all extremities.  Neurologic:  Normal speech and language. No gross focal neurologic deficits  Skin:  Skin is warm, dry and intact.  Psychiatric: Mood and affect are normal.    RADIOLOGY  CT pending  ____________________________________________   INITIAL IMPRESSION / ASSESSMENT AND PLAN / ED COURSE  Pertinent labs & imaging results that were available during my care of the patient were reviewed by me and considered in my medical decision making (see chart for details).  Patient presents to the emergency department for hematuria.  Patient has mild to moderate suprapubic tenderness however she states a history of fibroids and states she always has pain in this area. No CVA tenderness. We will check labs including urinalysis with urine culture, blood work, IV hydrate and obtain a noncontrasted CT scan. Overall the patient appears well, no distress.  CT pending, patient care signed out to oncoming provider. ____________________________________________   FINAL CLINICAL IMPRESSION(S) / ED DIAGNOSES  Hematuria   lower abdominal pain    Harvest Dark, MD 06/26/17 2255

## 2017-06-26 ENCOUNTER — Ambulatory Visit: Payer: Self-pay | Admitting: Physician Assistant

## 2017-06-28 LAB — URINE CULTURE

## 2017-07-01 ENCOUNTER — Ambulatory Visit: Payer: Managed Care, Other (non HMO) | Admitting: Family Medicine

## 2017-07-02 ENCOUNTER — Ambulatory Visit (INDEPENDENT_AMBULATORY_CARE_PROVIDER_SITE_OTHER): Payer: Managed Care, Other (non HMO) | Admitting: Family Medicine

## 2017-07-02 ENCOUNTER — Encounter: Payer: Self-pay | Admitting: Family Medicine

## 2017-07-02 VITALS — BP 110/70 | HR 71 | Temp 98.3°F | Resp 16 | Ht 68.0 in | Wt 153.0 lb

## 2017-07-02 DIAGNOSIS — N3001 Acute cystitis with hematuria: Secondary | ICD-10-CM

## 2017-07-02 DIAGNOSIS — Z87442 Personal history of urinary calculi: Secondary | ICD-10-CM | POA: Insufficient documentation

## 2017-07-02 DIAGNOSIS — R310 Gross hematuria: Secondary | ICD-10-CM

## 2017-07-02 LAB — POCT URINALYSIS DIPSTICK
BILIRUBIN UA: NEGATIVE
Glucose, UA: NEGATIVE
KETONES UA: NEGATIVE
LEUKOCYTES UA: NEGATIVE
Nitrite, UA: NEGATIVE
PH UA: 5 (ref 5.0–8.0)
PROTEIN UA: NEGATIVE
RBC UA: NEGATIVE
SPEC GRAV UA: 1.01 (ref 1.010–1.025)
Urobilinogen, UA: 0.2 E.U./dL

## 2017-07-02 NOTE — Assessment & Plan Note (Signed)
Possible recent stone passed, in ED CT Renal Stone Study shows L non obstructing stone - Prior history of stones with pregnancy, followed by Uro in [place] - See A&P for gross hematuria, offered referral to [facility], defer for now, until GYN surgery, then consider refer, discuss surveillance, prevention and other management

## 2017-07-02 NOTE — Assessment & Plan Note (Signed)
Clinically now resolved on antibiotics without further episodes, but recent reported gross hematuria and passing clots without prior history of similar episodes but has had prior kidney stone, not similar symptom - Reviewed [facility] ED results, UA with 0-5 RBC only. CT Renal Stone Study showed L non obstructing nephrolithiasis, otherwise fibroid and unremarkable. Urine culture showed E Coli 50k CFU - UA today was normal in office today - Concern for significant pathology, and cannot rule out potential malignancy (kidney, bladder) however she is lower risk due to age < 64 and non smoker - Differential includes more likely options of UTI (possible with E Coli), may be non obstructing Kidney stone with her prior history  Plan: 1. UA today - reviewed 2. Finish current Cipro antibiotic - no change, E Coli is sensitive. Decision to NOT repeat urine culture based on last one with only 50k CFU, and actively taking antibiotics still 3. Regarding possible nephrolithiasis vs work-up for other urinary etiology of gross hematuria, rule out malignancy etc, patient has had part of the work-up already with CT renal stone study in ED, no masses identified, now advised her that she may still benefit from referral to [facility] to discuss chronic stone management and consider adding cystoscopy in future, however she is waiting now for pre-op and further proceeding with hysterectomy per GYN, she decides to wait until after GYN surgery to pursue urology discussion, will notify office if wants referral sooner or if recurrence

## 2017-07-02 NOTE — Patient Instructions (Addendum)
Thank you for coming to the clinic today.  1.  Reassurance, possibly your bleeding was from kidney stone or possibly from UTI with E Coli (however only 50k colonies not enough for true infection)  Finish Cipro antibiotic  When ready please notify office via phone call and message or mychart, if you would like [place] to place referral Urology for discussion on gross hematuria vs chronic kidney stone discussion  [facility] -[address] [place],  [place]  [id number] Phone: [phone]  Please schedule a Follow-up Appointment to: Return in about 2 weeks (around 07/16/2017), or if symptoms worsen or fail to improve, for UTI / hematuria.  If you have any other questions or concerns, please feel free to call the clinic or send a message through [facility]. You may also schedule an earlier appointment if necessary.  Additionally, you may be receiving a survey about your experience at our clinic within a few days to 1 week by e-mail or mail. We value your feedback.  Nobie Putnam, DO [place]

## 2017-07-02 NOTE — Progress Notes (Signed)
Subjective:    Patient ID: Sheryl Carr, female    DOB: 1971-03-22, 46 y.o.   MRN: [id number]  Sheryl Carr is a 46 y.o. female presenting on 07/02/2017 for Hematuria (hospital follow up pt has hx of kidney stone but Sx were more like UTI)   HPI   ED FOLLOW-UP VISIT  Hospital/Location: [facility] Date of ED Visit: 06/25/17  Reason for Presenting to ED: Gross Hematuria / Dysuria Primary (+Secondary) Diagnosis: Unspecified gross hematuria, presumed UTI vs non obstructing nephrolithiasis  FOLLOW-UP HEMATURIA / DYSURIA / NEPHROLITHIASIS: - ED provider note and record have been reviewed - Patient presents today about 1 week after recent ED visit. Brief summary of recent course, patient had symptoms on day before went to ED on 7/16, she thought may have been "coming down with UTI", slight discomfort vs dysuria with voiding, next day seemed slightly worse with worsening some hematuria initially with small spots of blood in urine, then changed to more "clots" in urine, and then described more red tinged and "kool aid color urine", also with persistent dysuria and pelvic / bladder cramping after void, she discussed with an online triage nurse through employment advised to go to [facility] ED, in ED she describes a near syncopal episode after voiding and thought related to pain with void, had initial BP mildly low and then gradual increased BP back to normal and she felt better. Testing in ED with UA unremarkable for significant blood or infection, Urine culture (later resulted on 7/20 with E Coli 50k CFU, pan sensitive) also had CT Renal Stone Study showed stable fibroid and non obstructing left renal calculi, treated with Cipro 250mg  BID for 10 days empirically - Today reports overall has done well after discharge from ED. Symptoms of urinary frequency, dysuria are now resolved, and no further hematuria or blood clots in urine. Still admits some mild tenderness in abdomen/flank if lays on L side -  Admit history of prior kidney stones in past (last known stone pregnant in 2009, Urologist in [place]), she has had other kidney stones with other pregnancy as well, but no significant problems between pregnancy. Not established with local Urologist - She has stopped taking the AZO, only tried for 1 day - Still has 3 more days of Cipro, tolerating well - No other med changes on discharge from ED - Her BP has improved since, she does not check at home, no further episodes of near syncope or concerns since - Denies abdominal pain, pelvic pain, back pain, further hematuria, fever/chills, headache, lightheadedness, dyspnea, chest pain or pressure   Social History  Substance Use Topics  . Smoking status: Former Smoker    Quit date: 09/24/1990  . Smokeless tobacco: Never Used  . Alcohol use Yes     Comment: social    Review of Systems Per HPI unless specifically indicated above     Objective:    BP 110/70 (BP Location: Left Arm, Cuff Size: Normal)   Pulse 71   Temp 98.3 F (36.8 C) (Oral)   Resp 16   Ht 5\' 8"  (1.727 m)   Wt 153 lb (69.4 kg)   BMI 23.26 kg/m   Wt Readings from Last 3 Encounters:  07/02/17 153 lb (69.4 kg)  06/25/17 149 lb (67.6 kg)  05/20/17 149 lb (67.6 kg)    Physical Exam  Constitutional: She is oriented to person, place, and time. She appears well-developed and well-nourished. No distress.  Well-appearing, comfortable, cooperative  HENT:  Head: Normocephalic and  atraumatic.  Mouth/Throat: Oropharynx is clear and moist.  Eyes: Conjunctivae are normal. Right eye exhibits no discharge. Left eye exhibits no discharge.  Neck: Normal range of motion. Neck supple.  Cardiovascular: Normal rate and intact distal pulses.   Pulmonary/Chest: Effort normal.  Abdominal: Soft. She exhibits no distension. There is no tenderness.  Musculoskeletal: She exhibits no edema.  No CVAT or back tenderness  Neurological: She is alert and oriented to person, place, and time.    Skin: Skin is warm and dry. No rash noted. She is not diaphoretic. No erythema.  Psychiatric: She has a normal mood and affect. Her behavior is normal.  Well groomed, good eye contact, normal speech and thoughts  Nursing note and vitals reviewed.  I have personally reviewed the radiology report from CT Renal Stone Study on 06/25/17.  CLINICAL DATA:  Hematuria.  Lower abdominal pain.  EXAM: CT ABDOMEN AND PELVIS WITHOUT CONTRAST  TECHNIQUE: Multidetector CT imaging of the abdomen and pelvis was performed following the standard protocol without IV contrast.  COMPARISON:  None  FINDINGS: Lower chest: No acute abnormality.  Hepatobiliary: No focal liver abnormality is seen. No gallstones, gallbladder wall thickening, or biliary dilatation.  Pancreas: Unremarkable. No pancreatic ductal dilatation or surrounding inflammatory changes.  Spleen: Normal in size without focal abnormality.  Adrenals/Urinary Tract: The adrenal glands are normal. Bilateral kidney cysts are noted measuring up to 1.7 cm. These are incompletely characterized without IV contrast. Small punctate left renal calculi noted. No hydronephrosis or hydroureter. Urinary bladder appears normal.  Stomach/Bowel: The stomach is normal. The small bowel loops have a normal course and caliber. No bowel obstruction the appendix is visualized and appears within normal limits. Of the colon.  Vascular/Lymphatic: Normal appearance of the abdominal aorta. No enlarged retroperitoneal or mesenteric adenopathy. No enlarged pelvic or inguinal lymph nodes.  Reproductive: Enlarged fibroid uterus identified. Within the left side of the posterior fundus there is a single large fibroid measuring 8.5 cm. Previously this was reported as measuring 6.2 cm. Centrally, there is lower attenuation within this fibroid, which may be complicated by internal degeneration.  Other: No abdominal wall hernia or abnormality. No  abdominopelvic ascites. Small volume of low-density fluid noted within the dependent portion of the pelvis.  Musculoskeletal: No aggressive lytic or sclerotic bone lesion.  IMPRESSION: 1. Since the previous exam there has been interval enlargement of dominant uterine fibroids which now measures 8.5 cm. Centrally this appears low in attenuation which may reflect internal degeneration. Further characterization could be obtained with pelvic sonogram. 2. Nonobstructing left renal calculi.   Electronically Signed   By: Kerby Moors M.D.   On: 06/25/2017 20:50  Results for orders placed or performed in visit on 07/02/17  POCT Urinalysis Dipstick  Result Value Ref Range   Color, UA dark amber    Clarity, UA clear    Glucose, UA negative    Bilirubin, UA negative    Ketones, UA negative    Spec Grav, UA 1.010 1.010 - 1.025   Blood, UA negative    pH, UA 5.0 5.0 - 8.0   Protein, UA negative    Urobilinogen, UA 0.2 0.2 or 1.0 E.U./dL   Nitrite, UA negative    Leukocytes, UA Negative Negative      Assessment & Plan:   Problem List Items Addressed This Visit    History of nephrolithiasis    Possible recent stone passed, in ED CT Renal Stone Study shows L non obstructing stone - Prior history of  stones with pregnancy, followed by Uro in [place] - See A&P for gross hematuria, offered referral to [facility], defer for now, until GYN surgery, then consider refer, discuss surveillance, prevention and other management      Gross hematuria - Primary    Clinically now resolved on antibiotics without further episodes, but recent reported gross hematuria and passing clots without prior history of similar episodes but has had prior kidney stone, not similar symptom - Reviewed [facility] ED results, UA with 0-5 RBC only. CT Renal Stone Study showed L non obstructing nephrolithiasis, otherwise fibroid and unremarkable. Urine culture showed E Coli 50k CFU - UA today was normal in office  today - Concern for significant pathology, and cannot rule out potential malignancy (kidney, bladder) however she is lower risk due to age < 43 and non smoker - Differential includes more likely options of UTI (possible with E Coli), may be non obstructing Kidney stone with her prior history  Plan: 1. UA today - reviewed 2. Finish current Cipro antibiotic - no change, E Coli is sensitive. Decision to NOT repeat urine culture based on last one with only 50k CFU, and actively taking antibiotics still 3. Regarding possible nephrolithiasis vs work-up for other urinary etiology of gross hematuria, rule out malignancy etc, patient has had part of the work-up already with CT renal stone study in ED, no masses identified, now advised her that she may still benefit from referral to [facility] to discuss chronic stone management and consider adding cystoscopy in future, however she is waiting now for pre-op and further proceeding with hysterectomy per GYN, she decides to wait until after GYN surgery to pursue urology discussion, will notify office if wants referral sooner or if recurrence      Relevant Orders   POCT Urinalysis Dipstick (Completed)    Other Visit Diagnoses    Acute cystitis with hematuria       Resolved, finish cipro, will not repeat UrCx, may be cause of her bleeding      No orders of the defined types were placed in this encounter.  I have reviewed the discharge medication list, and have reconciled the current and discharge medications today.   Current Outpatient Prescriptions:  .  econazole nitrate 1 % cream, APP AA ONCE OR TWICE D UTD, Disp: , Rfl: 2 .  ferrous sulfate 325 (65 FE) MG tablet, Take 325 mg by mouth daily with breakfast., Disp: , Rfl:  .  losartan (COZAAR) 50 MG tablet, Take 1 tablet (50 mg total) by mouth daily., Disp: 30 tablet, Rfl: 12 .  Multiple Vitamins-Calcium (ONE-A-DAY WOMENS FORMULA PO), Take by mouth., Disp: , Rfl:  .  omeprazole (PRILOSEC) 20 MG  capsule, Take 1 capsule (20 mg total) by mouth 2 (two) times daily before a meal., Disp: 60 capsule, Rfl: 12 .  ciprofloxacin (CIPRO) 250 MG tablet, Take 1 tablet (250 mg total) by mouth 2 (two) times daily. (Patient not taking: Reported on 07/02/2017), Disp: 20 tablet, Rfl: 0 .  phenazopyridine (PYRIDIUM) 200 MG tablet, Take 1 tablet (200 mg total) by mouth 3 (three) times daily as needed for pain. (Patient not taking: Reported on 07/02/2017), Disp: 20 tablet, Rfl: 0  Follow up plan: Return in about 2 weeks (around 07/16/2017), or if symptoms worsen or fail to improve, for UTI / hematuria.  Nobie Putnam, [facility] 07/02/2017, 1:14 PM

## 2017-07-18 ENCOUNTER — Telehealth: Payer: Self-pay

## 2017-07-18 ENCOUNTER — Encounter: Payer: Managed Care, Other (non HMO) | Admitting: Obstetrics and Gynecology

## 2017-07-18 NOTE — Telephone Encounter (Signed)
Pt was scheduled for a pre op appt today at 9:45 with AC. Keyah left her a message on 7/12 that she needed to r/s her appt. Pt came in today and explained she did not get the message. She also received a my chart message that she had an appt. Angie made pt aware that her appt needed to be r/s. She was very upset. She felt like we should have called her more than once to let her know her appt needed to be r/s. Per pt she did raise her voice and was venting her frustrations to Angie. Angie apologized. She did not raise her voice. She offered to get a manger to help her. Pt was upset and felt like Angie was not compassionate about her situation. The pt states Angie did not give  let her vent. She told pt she was not going to speak to her that way. Pt aware that we have policy in place that pts are not allowed to speak to staff rudely, curse or raise voices. Staff has to get a Freight forwarder. Pt was understanding of this policy. She only  wanted to be heard. Pt did say she was very anxious about today's appointment. I apologized multiple times. Let her know that we should have contacted her at least 3 times. Advised I would address this with front staff. R/s her pre-op for 8/14 at 9:45. Pt thanked me for my time.

## 2017-07-23 ENCOUNTER — Encounter: Payer: Self-pay | Admitting: Obstetrics and Gynecology

## 2017-07-23 ENCOUNTER — Ambulatory Visit (INDEPENDENT_AMBULATORY_CARE_PROVIDER_SITE_OTHER): Payer: Managed Care, Other (non HMO) | Admitting: Obstetrics and Gynecology

## 2017-07-23 VITALS — BP 154/74 | HR 80 | Ht 68.0 in | Wt 152.5 lb

## 2017-07-23 DIAGNOSIS — Z8742 Personal history of other diseases of the female genital tract: Secondary | ICD-10-CM

## 2017-07-23 DIAGNOSIS — Z01818 Encounter for other preprocedural examination: Secondary | ICD-10-CM

## 2017-07-23 DIAGNOSIS — D251 Intramural leiomyoma of uterus: Secondary | ICD-10-CM

## 2017-07-23 DIAGNOSIS — I10 Essential (primary) hypertension: Secondary | ICD-10-CM

## 2017-07-23 NOTE — Progress Notes (Signed)
GYNECOLOGY PROGRESS NOTE  Subjective:    Patient ID: Sheryl Carr, female    DOB: October 15, 1971, 46 y.o.   MRN: [id number]  HPI  Patient is a 45 y.o. [id number] female who presents for pre-operative appointment for hysterectomy.  Patient notes that she has been going back and forth regarding her decision for definitive surgical management for almost 2 years, but is finally ready to proceed.  Patient has h/o irregular heavy menses and enlarged fibroid uterus, however notes that over the past several months (at least 6-8), her periods have regulated again.    Of note, patient was seen in the Emergency Room, last month for complaints of hematuria and back pain, thought she was passing a kidney stone. Was diagnosed with renal stone that was not passing, and a UTI. Reports taking antibiotics. During CT renal study, it was noted that her uterine fibroids had increased in size (from 6 cm to 8 cm) since last ultrasound last year.   The following portions of the patient's history were reviewed and updated as appropriate: allergies, current medications, past family history, past medical history, past social history, past surgical history and problem list.  Review of Systems Pertinent items noted in HPI and remainder of comprehensive ROS otherwise negative.   Objective:   Blood pressure (!) 154/74, pulse 80, height 5\' 8"  (1.727 m), weight 152 lb 8 oz (69.2 kg), last menstrual period 07/12/2017. General appearance: alert and no distress Abdomen: normal findings: bowel sounds normal, no organomegaly, no scars, striae, dilated veins, rashes, or lesions and soft and abnormal findings:  mass, located arising from the pelvis, palpable to mdway between pubic symphysis and umbilicus Pelvic: cervix normal in appearance, external genitalia normal, no adnexal masses or tenderness, no cervical motion tenderness, rectovaginal septum normal, uterus normal size, shape, and consistency and vagina normal without  discharge Extremities: extremities normal, atraumatic, no cyanosis or edema Neurologic: Grossly normal    Labs:  Lab Results  Component Value Date   WBC 11.0 06/25/2017   HGB 11.7 (L) 06/25/2017   HCT 34.9 (L) 06/25/2017   MCV 84.8 06/25/2017   PLT 297 06/25/2017    Pathology:  08/03/2015 - pap smear normal.   11/14/2015 -  A. ENDOMETRIUM; DILATATION AND CURETTAGE:  - SECRETORY ENDOMETRIUM WITH PROMINENT BREAKDOWN CHANGES.  - POLYPOID FRAGMENTS OF SECRETORY-TYPE ENDOMETRIUM.  - NEGATIVE FOR HYPERPLASIA AND CARCINOMA.    B. ENDOMETRIAL POLYP; DILATATION AND CURETTAGE:  - SECRETORY ENDOMETRIUM WITH PROMINENT BREAKDOWN CHANGES.  - POLYPOID FRAGMENTS OF SECRETORY-TYPE ENDOMETRIUM.  - NEGATIVE FOR HYPERPLASIA AND CARCINOMA.    Imaging:  CLINICAL DATA:  Hematuria.  Lower abdominal pain.  EXAM (06/28/2016): CT ABDOMEN AND PELVIS WITHOUT CONTRAST  TECHNIQUE: Multidetector CT imaging of the abdomen and pelvis was performed following the standard protocol without IV contrast.  COMPARISON:  None  FINDINGS: Lower chest: No acute abnormality.  Hepatobiliary: No focal liver abnormality is seen. No gallstones, gallbladder wall thickening, or biliary dilatation.  Pancreas: Unremarkable. No pancreatic ductal dilatation or surrounding inflammatory changes.  Spleen: Normal in size without focal abnormality.  Adrenals/Urinary Tract: The adrenal glands are normal. Bilateral kidney cysts are noted measuring up to 1.7 cm. These are incompletely characterized without IV contrast. Small punctate left renal calculi noted. No hydronephrosis or hydroureter. Urinary bladder appears normal.  Stomach/Bowel: The stomach is normal. The small bowel loops have a normal course and caliber. No bowel obstruction the appendix is visualized and appears within normal limits. Of the colon.  Vascular/Lymphatic: Normal  appearance of the abdominal aorta. No enlarged retroperitoneal or  mesenteric adenopathy. No enlarged pelvic or inguinal lymph nodes.  Reproductive: Enlarged fibroid uterus identified. Within the left side of the posterior fundus there is a single large fibroid measuring 8.5 cm. Previously this was reported as measuring 6.2 cm. Centrally, there is lower attenuation within this fibroid, which may be complicated by internal degeneration.  Other: No abdominal wall hernia or abnormality. No abdominopelvic ascites. Small volume of low-density fluid noted within the dependent portion of the pelvis.  Musculoskeletal: No aggressive lytic or sclerotic bone lesion.  IMPRESSION: 1. Since the previous exam there has been interval enlargement of dominant uterine fibroids which now measures 8.5 cm. Centrally this appears low in attenuation which may reflect internal degeneration. Further characterization could be obtained with pelvic sonogram. 2. Nonobstructing left renal calculi.    ULTRASOUND REPORT  Location: [facility] Date of Service: 06/28/16   Indications:Enlarged Uterus and AUB Findings:  The uterus measures 11 x 7.6 x 10.4cm. Echo texture is heterogenous with and without evidence of  A focal mass. Within the uterus is a suspected fibroid measuring: Fibroid 1: 6 x 6.7 x 6.7cm  Left uterine fundus communicating with the endometrium.  The Endometrium Is thickened and measures 12.3 mm.  Hypoechoic mass in fundal portion of endometrium with internal color flow measures 1.5 x .8 x 1.3cm.  Right Ovary measures 4.1 x 2.2 x 2 cm. It is normal in appearance. Left Ovary measures 3.8 x 2.4 x 2.3 cm. It is normal appearance. Survey of the adnexa demonstrates no adnexal masses. There is no free fluid in the cul de sac.  Impression: 1. Fibroid uterus. Fibroid is larger than on the previous scan. 2. Possible endometrial polyp.  Recommendations: 1.Clinical correlation with the patient's History and Physical Exam.   Assessment:     Enlarged fibroid uterus H/o abnormal menses Essential HTN  Plan:   - Patient desires surgical management with hysterectomy.  Plan is for total abdominal hysterectomy with bilateral salpingectomy. The risks of surgery were discussed in detail with the patient including but not limited to: bleeding which may require transfusion or reoperation; infection which may require prolonged hospitalization or re-hospitalization and antibiotic therapy; injury to bowel, bladder, ureters and major vessels or other surrounding organs; need for additional procedures including laparotomy; thromboembolic phenomenon, incisional problems and other postoperative or anesthesia complications.  Patient was told that the likelihood that her condition and symptoms will be treated effectively with this surgical management was very high; the postoperative expectations were also discussed in detail. The patient also understands the alternative treatment options which were discussed in full. All questions were answered.  She was told that she will be contacted by our surgical scheduler regarding the time and date of her surgery; routine preoperative instructions of having nothing to eat or drink after midnight on the day prior to surgery and also coming to the hospital 1.5 hours prior to her time of surgery were also emphasized.  She was told she may be called for a preoperative appointment about a week prior to surgery and will be given further preoperative instructions at that visit. Printed patient education handouts about the procedure were given to the patient to review at home.  Surgery date scheduled for 08/19/2017.    [name], MD [facility]

## 2017-07-23 NOTE — Patient Instructions (Signed)
You are scheduled for surgery on 9/102018.  Nothing to eat after midnight on day prior to surgery.  Do not take any medications unless recommended by your provider on day prior to surgery.  Do not take NSAIDs (Motrin, Aleve) or aspirin 7 days prior to surgery.  You may take Tylenol products for minor aches and pains.  You will receive a prescription for pain medications post-operatively.  You will be contacted by phone approximately 1 week prior to surgery to schedule pre-operative appointment.  Please call the office if you have any questions regarding your upcoming surgery.      Hysterectomy Information A hysterectomy is a surgery to remove your uterus. After surgery, you will no longer have periods. Also, you will not be able to get pregnant. Reasons for this surgery  You have bleeding that is not normal and keeps coming back.  You have lasting (chronic) lower belly (pelvic) pain.  You have a lasting infection.  The lining of your uterus grows outside your uterus.  The lining of your uterus grows in the muscle of your uterus.  Your uterus falls down into your vagina.  You have a growth in your uterus that causes problems.  You have cells that could turn into cancer (precancerous cells).  You have cancer of the uterus or cervix. Types There are 3 types of hysterectomies. Depending on the type, the surgery will:  Remove the top part of the uterus only.  Remove the uterus and the cervix.  Remove the uterus, cervix, and tissue that holds the uterus in place in the lower belly.  Ways a hysterectomy can be performed There are 5 ways this surgery can be performed.  A cut (incision) is made in the belly (abdomen). The uterus is taken out through the cut.  A cut is made in the vagina. The uterus is taken out through the cut.  Three or four cuts are made in the belly. A surgical device with a camera is put through one of the cuts. The uterus is cut into small pieces. The  uterus is taken out through the cuts or the vagina.  Three or four cuts are made in the belly. A surgical device with a camera is put through one of the cuts. The uterus is taken out through the vagina.  Three or four cuts are made in the belly. A surgical device that is controlled by a computer makes a visual image. The device helps the surgeon control the surgical tools. The uterus is cut into small pieces. The pieces are taken out through the cuts or through the vagina.  What can I expect after the surgery?  You will be given pain medicine.  You will need help at home for 3-5 days after surgery.  You will need to see your doctor in 2-4 weeks after surgery.  You may get hot flashes, have night sweats, and have trouble sleeping.  You may need to have Pap tests in the future if your surgery was related to cancer. Talk to your doctor. It is still good to have regular exams. This information is not intended to replace advice given to you by your health care provider. Make sure you discuss any questions you have with your health care provider. Document Released: 02/18/2012 Document Revised: 05/03/2016 Document Reviewed: 08/03/2013 Elsevier Interactive Patient Education  [employer].

## 2017-07-24 ENCOUNTER — Encounter: Payer: Self-pay | Admitting: Obstetrics and Gynecology

## 2017-07-24 NOTE — H&P (Signed)
GYNECOLOGY PRE-OPERATIVE HISTORY AND PHYSICAL Subjective:    Patient is a 46 y.o. [id number] female scheduled for TAH with bilateral salpingectomy. Indications for procedure are enlarged fibroid uterus and h/o abnormal uterine bleeding.   Pertinent Gynecological History: Menses: flow is moderate and regular every month without intermenstrual spotting  Bleeding: dysfunctional uterine bleeding Contraception: none Last mammogram: normal Date: 08/2016 Last pap: normal Date: 07/2015  Discussed Blood/Blood Products: yes   Menstrual History:  Menarche age: 78 Patient's last menstrual period was 07/12/2017 (exact date).     OB History  Gravida Para Term Preterm AB Living  2 2 2     2   SAB TAB Ectopic Multiple Live Births          2    # Outcome Date GA Lbr Len/2nd Weight Sex Delivery Anes PTL Lv  2 Term         LIV  1 Term         LIV      Past Medical History:  Diagnosis Date  . Anemia   . Chronic kidney disease    H/O KIDNEY STONES  . Endometrial polyp 2016   hysteroscopically removed  . GERD (gastroesophageal reflux disease)   . H/O mammogram 05/11/2015  . Hematuria   . HTN (hypertension)    PT WAS TAKEN OFF HER BP MED IN MAY/JUNE OF 2016 BUT BP IS ELEVATED AGAIN    Past Surgical History:  Procedure Laterality Date  . CESAREAN SECTION    . HYSTEROSCOPY W/D&C N/A 11/14/2015   Procedure: DILATATION AND CURETTAGE /HYSTEROSCOPY;  Surgeon: [name], MD;  Location: [facility];  Service: Gynecology;  Laterality: N/A;  . KIDNEY STONE SURGERY      Family History  Problem Relation Age of Onset  . Diabetes Mellitus I Mother   . Diabetes Mellitus I Maternal Grandmother   . Prostate cancer Maternal Grandfather   . Diabetes Mellitus I Paternal Grandmother     Social History   Social History  . Marital status: Married    Spouse name: [name]  . Number of children: 2  . Years of education: college   Occupational History  . n/a   .  [place] History  Main Topics  . Smoking status: Former Smoker    Quit date: 09/24/1990  . Smokeless tobacco: Never Used  . Alcohol use Yes     Comment: social  . Drug use: No  . Sexual activity: Yes    Birth control/ protection: None   Other Topics Concern  . None   Social History Narrative   Patient is married Therapist, occupational) and lives at home with her husband and one child, one child is in college.   Patient is working full-time.   Patient has a college education.   Patient is right-handed.   Patient drinks 1-2 cups of coffee daily, soda and tea are occasionally, not daily.    Current Outpatient Prescriptions on File Prior to Visit  Medication Sig Dispense Refill  . econazole nitrate 1 % cream APP AA ONCE OR TWICE D UTD  2  . ferrous sulfate 325 (65 FE) MG tablet Take 325 mg by mouth daily with breakfast.    . losartan (COZAAR) 50 MG tablet Take 1 tablet (50 mg total) by mouth daily. 30 tablet 12  . Multiple Vitamins-Calcium (ONE-A-DAY WOMENS FORMULA PO) Take by mouth.    Marland Kitchen omeprazole (PRILOSEC) 20 MG capsule Take 1 capsule (20 mg total) by mouth 2 (two) times  daily before a meal. 60 capsule 12   No current facility-administered medications on file prior to visit.      Allergies  Allergen Reactions  . Amoxicillin   . Pollen Extract     itchy eyes, dull headaches, runny nose  . Penicillins Rash    Review of Systems Constitutional: No recent fever/chills/sweats Respiratory: No recent cough/bronchitis Cardiovascular: No chest pain Gastrointestinal: No recent nausea/vomiting/diarrhea Genitourinary: No UTI symptoms Hematologic/lymphatic:No history of coagulopathy or recent blood thinner use    Objective:    BP (!) 154/74   Pulse 80   Ht 5\' 8"  (1.727 m)   Wt 152 lb 8 oz (69.2 kg)   LMP 07/12/2017 (Exact Date)   BMI 23.19 kg/m   General:   Normal  Skin:   normal  HEENT:  Normal  Neck:  Supple without Adenopathy or Thyromegaly  Lungs:   Heart:              Breasts:   Abdomen:    Pelvis:  M/S   Extremeties:  Neuro:    clear to auscultation bilaterally   Normal without murmur   Not Examined   soft, non-tender; bowel sounds normal; no organomegaly. Mass palpable midway between umbilicus and pubic symphysis.    Exam deferred to OR  No CVAT  Warm/Dry  Normal        Labs:  Lab Results  Component Value Date   WBC 11.0 06/25/2017   HGB 11.7 (L) 06/25/2017   HCT 34.9 (L) 06/25/2017   MCV 84.8 06/25/2017   PLT 297 06/25/2017    No results found for: TSH    Pathology:  08/03/2015 - pap smear normal.   11/14/2015 -  A. ENDOMETRIUM; DILATATION AND CURETTAGE:  - SECRETORY ENDOMETRIUM WITH PROMINENT BREAKDOWN CHANGES.  - POLYPOID FRAGMENTS OF SECRETORY-TYPE ENDOMETRIUM.  - NEGATIVE FOR HYPERPLASIA AND CARCINOMA.   B. ENDOMETRIAL POLYP; DILATATION AND CURETTAGE:  - SECRETORY ENDOMETRIUM WITH PROMINENT BREAKDOWN CHANGES.  - POLYPOID FRAGMENTS OF SECRETORY-TYPE ENDOMETRIUM.  - NEGATIVE FOR HYPERPLASIA AND CARCINOMA.    Imaging:  CLINICAL DATA: Hematuria. Lower abdominal pain.  EXAM (06/28/2016): CT ABDOMEN AND PELVIS WITHOUT CONTRAST  TECHNIQUE: Multidetector CT imaging of the abdomen and pelvis was performed following the standard protocol without IV contrast.  COMPARISON: None  FINDINGS: Lower chest: No acute abnormality.  Hepatobiliary: No focal liver abnormality is seen. No gallstones, gallbladder wall thickening, or biliary dilatation.  Pancreas: Unremarkable. No pancreatic ductal dilatation or surrounding inflammatory changes.  Spleen: Normal in size without focal abnormality.  Adrenals/Urinary Tract: The adrenal glands are normal. Bilateral kidney cysts are noted measuring up to 1.7 cm. These are incompletely characterized without IV contrast. Small punctate left renal calculi noted. No hydronephrosis or hydroureter. Urinary bladder appears normal.  Stomach/Bowel: The stomach is normal. The small bowel loops have  a normal course and caliber. No bowel obstruction the appendix is visualized and appears within normal limits. Of the colon.  Vascular/Lymphatic: Normal appearance of the abdominal aorta. No enlarged retroperitoneal or mesenteric adenopathy. No enlarged pelvic or inguinal lymph nodes.  Reproductive: Enlarged fibroid uterus identified. Within the left side of the posterior fundus there is a single large fibroid measuring 8.5 cm. Previously this was reported as measuring 6.2 cm. Centrally, there is lower attenuation within this fibroid, which may be complicated by internal degeneration.  Other: No abdominal wall hernia or abnormality. No abdominopelvic ascites. Small volume of low-density fluid noted within the dependent portion of the pelvis.  Musculoskeletal: No aggressive  lytic or sclerotic bone lesion.  IMPRESSION: 1. Since the previous exam there has been interval enlargement of dominant uterine fibroids which now measures 8.5 cm. Centrally this appears low in attenuation which may reflect internal degeneration. Further characterization could be obtained with pelvic sonogram. 2. Nonobstructing left renal calculi.    ULTRASOUND REPORT  Location: [facility] Date of Service: 06/28/16   Indications:Enlarged Uterus and AUB Findings: The uterus measures 11 x 7.6 x 10.4cm. Echo texture is heterogenouswith and withoutevidence of A focal mass. Within the uterus is a suspected fibroid measuring: Fibroid 1: 6 x 6.7 x 6.7cm Left uterine fundus communicating with the endometrium.  The Endometrium Is thickened andmeasures 12.3 mm. Hypoechoic mass in fundal portion of endometrium with internal color flow measures 1.5 x .8 x 1.3cm.  Right Ovary measures 4.1 x 2.2 x 2 cm. It is normal in appearance. Left Ovary measures 3.8 x 2.4 x 2.3 cm. It is normal appearance. Survey of the adnexa demonstrates no adnexal masses. There is no free fluid in the cul de  sac.  Impression: 1. Fibroid uterus. Fibroid is larger than on the previous scan. 2. Possible endometrial polyp.  Recommendations: 1.Clinical correlation with the patient's History and Physical Exam.   Assessment:   Enlarged fibroid uterus H/o abnormal menses Essential HTN  Plan:    Counseling: Procedure, risks, reasons, benefits and complications (including injury to bowel, bladder, major blood vessel, ureter, bleeding, possibility of transfusion, infection, or fistula formation) reviewed in detail.  Consent to be signed. Preop testing ordered. Instructions reviewed, including NPO after midnight.   [name], MD [facility]

## 2017-08-05 ENCOUNTER — Other Ambulatory Visit: Payer: Self-pay | Admitting: Family Medicine

## 2017-08-05 ENCOUNTER — Other Ambulatory Visit: Payer: Self-pay | Admitting: Obstetrics and Gynecology

## 2017-08-05 DIAGNOSIS — Z1231 Encounter for screening mammogram for malignant neoplasm of breast: Secondary | ICD-10-CM

## 2017-08-08 ENCOUNTER — Telehealth: Payer: Self-pay

## 2017-08-08 NOTE — Telephone Encounter (Signed)
Called pt informed her that FMLA paperwork had been completed. Pt gave verbal understanding. Paperwork filed in Programme researcher, broadcasting/film/video and faxed.

## 2017-08-13 ENCOUNTER — Encounter: Payer: Managed Care, Other (non HMO) | Admitting: Obstetrics and Gynecology

## 2017-08-13 ENCOUNTER — Encounter
Admission: RE | Admit: 2017-08-13 | Discharge: 2017-08-13 | Disposition: A | Discharge status: Home / Self Care | Payer: Managed Care, Other (non HMO) | Source: Ambulatory Visit | Attending: Obstetrics and Gynecology | Admitting: Obstetrics and Gynecology

## 2017-08-13 DIAGNOSIS — I10 Essential (primary) hypertension: Secondary | ICD-10-CM | POA: Diagnosis not present

## 2017-08-13 DIAGNOSIS — Z01812 Encounter for preprocedural laboratory examination: Secondary | ICD-10-CM | POA: Insufficient documentation

## 2017-08-13 HISTORY — DX: Personal history of urinary calculi: Z87.442

## 2017-08-13 NOTE — Patient Instructions (Addendum)
  Your procedure is scheduled on: 08/19/17 Report to Day Surgery. Medical mall second floor To find out your arrival time please call [phone] between 1PM - 3PM on 08/16/17.  Remember: Instructions that are not followed completely may result in serious medical risk, up to and including death, or upon the discretion of your surgeon and anesthesiologist your surgery may need to be rescheduled.    x____ 1. Do not eat food after midnight the night before your procedure. No gum chewing or hard candies. You may drink clear liquids up to 2 hours before you are scheduled to arrive for your surgery- DO not drink clear liquids within 2 hours of the start of your surgery.  Clear Liquids include: water, apple juice without pulp, clear carbohydrate drink such as Clearfast of Gartorade, Black Coffee or Tea (Do not add anything to coffee or tea).    x____ 2. No Alcohol for 24 hours before or after surgery.   ____ 3. Do Not Smoke For 24 Hours Prior to Your Surgery.   ____ 4. Bring all medications with you on the day of surgery if instructed.    ___x_ 5. Notify your doctor if there is any change in your medical condition     (cold, fever, infections).       Do not wear jewelry, make-up, hairpins, clips or nail polish.  Do not wear lotions, powders, or perfumes. You may wear deodorant.  Do not shave 48 hours prior to surgery. Men may shave face and neck.  Do not bring valuables to the hospital.    [facility] is not responsible for any belongings or valuables.               Contacts, dentures or bridgework may not be worn into surgery.  Leave your suitcase in the car. After surgery it may be brought to your room.  For patients admitted to the hospital, discharge time is determined by your                treatment team.   Patients discharged the day of surgery will not be allowed to drive home.   Please read over the following fact sheets that you were given:   Spirometry given /  practice and bring am surgery   _x___ Take these medicines the morning of surgery with A SIP OF WATER:    1. prilosec  2.   3.   4.  5.  6.  ____ Fleet Enema (as directed)   ____ Use CHG Soap as directed  ____ Use inhalers on the day of surgery  ____ Stop metformin 2 days prior to surgery    ____ Take 1/2 of usual insulin dose the night before surgery and none on the morning of surgery.   ____ Stop Coumadin/Plavix/aspirin on   ____ Stop Anti-inflammatories on   ____ Stop supplements until after surgery.    ____ Bring C-Pap to the hospital.

## 2017-08-14 ENCOUNTER — Encounter
Admission: RE | Admit: 2017-08-14 | Discharge: 2017-08-14 | Disposition: A | Discharge status: Home / Self Care | Payer: Managed Care, Other (non HMO) | Source: Ambulatory Visit | Attending: Obstetrics and Gynecology | Admitting: Obstetrics and Gynecology

## 2017-08-14 DIAGNOSIS — I10 Essential (primary) hypertension: Secondary | ICD-10-CM | POA: Diagnosis not present

## 2017-08-14 DIAGNOSIS — Z01812 Encounter for preprocedural laboratory examination: Secondary | ICD-10-CM | POA: Diagnosis present

## 2017-08-14 LAB — BASIC METABOLIC PANEL
Anion gap: 6 (ref 5–15)
BUN: 15 mg/dL (ref 6–20)
CALCIUM: 8.9 mg/dL (ref 8.9–10.3)
CO2: 28 mmol/L (ref 22–32)
CREATININE: 0.58 mg/dL (ref 0.44–1.00)
Chloride: 105 mmol/L (ref 101–111)
GFR calc Af Amer: >60 mL/min (ref >60)
Glucose, Bld: 96 mg/dL (ref 65–99)
POTASSIUM: 3.9 mmol/L (ref 3.5–5.1)
SODIUM: 139 mmol/L (ref 135–145)

## 2017-08-14 LAB — CBC
HEMATOCRIT: 32.6 % — AB (ref 35.0–47.0)
Hemoglobin: 11 g/dL — ABNORMAL LOW (ref 12.0–16.0)
MCH: 28.3 pg (ref 26.0–34.0)
MCHC: 33.6 g/dL (ref 32.0–36.0)
MCV: 84.2 fL (ref 80.0–100.0)
PLATELETS: 253 10*3/uL (ref 150–440)
RBC: 3.88 MIL/uL (ref 3.80–5.20)
RDW: 13.3 % (ref 11.5–14.5)
WBC: 4.2 10*3/uL (ref 3.6–11.0)

## 2017-08-14 LAB — TSH: TSH: 3.731 u[IU]/mL (ref 0.350–4.500)

## 2017-08-14 LAB — TYPE AND SCREEN
ABO/RH(D): O POS
Antibody Screen: NEGATIVE

## 2017-08-14 LAB — RAPID HIV SCREEN (HIV 1/2 AB+AG)
HIV 1/2 ANTIBODIES: NONREACTIVE
HIV-1 P24 ANTIGEN - HIV24: NONREACTIVE

## 2017-08-15 LAB — RPR: RPR: NONREACTIVE

## 2017-08-16 MED ORDER — CEFAZOLIN SODIUM-DEXTROSE 2-4 GM/100ML-% IV SOLN: 2.0000 g | INTRAVENOUS | Status: DC

## 2017-08-18 MED ORDER — DEXTROSE 5 % IV SOLN
INTRAVENOUS | Status: AC
  Administered 2017-08-19: 115 mL via INTRAVENOUS
  Filled 2017-08-18: qty 8.75

## 2017-08-19 ENCOUNTER — Inpatient Hospital Stay
Admission: RE | Admit: 2017-08-19 | Discharge: 2017-08-21 | DRG: 743 | Disposition: A | Discharge status: Home / Self Care | Payer: Managed Care, Other (non HMO) | Source: Ambulatory Visit | Attending: Obstetrics and Gynecology | Admitting: Obstetrics and Gynecology

## 2017-08-19 ENCOUNTER — Inpatient Hospital Stay: Payer: Managed Care, Other (non HMO) | Admitting: Anesthesiology

## 2017-08-19 ENCOUNTER — Encounter
Admission: RE | Disposition: A | Discharge status: Home / Self Care | Payer: Self-pay | Source: Ambulatory Visit | Attending: Obstetrics and Gynecology

## 2017-08-19 ENCOUNTER — Encounter: Payer: Self-pay | Admitting: *Deleted

## 2017-08-19 DIAGNOSIS — D259 Leiomyoma of uterus, unspecified: Principal | ICD-10-CM

## 2017-08-19 DIAGNOSIS — N920 Excessive and frequent menstruation with regular cycle: Secondary | ICD-10-CM | POA: Diagnosis present

## 2017-08-19 DIAGNOSIS — I10 Essential (primary) hypertension: Secondary | ICD-10-CM | POA: Diagnosis present

## 2017-08-19 DIAGNOSIS — Z87891 Personal history of nicotine dependence: Secondary | ICD-10-CM

## 2017-08-19 DIAGNOSIS — Z880 Allergy status to penicillin: Secondary | ICD-10-CM

## 2017-08-19 DIAGNOSIS — K219 Gastro-esophageal reflux disease without esophagitis: Secondary | ICD-10-CM | POA: Diagnosis present

## 2017-08-19 DIAGNOSIS — N939 Abnormal uterine and vaginal bleeding, unspecified: Secondary | ICD-10-CM | POA: Diagnosis present

## 2017-08-19 DIAGNOSIS — Z90710 Acquired absence of both cervix and uterus: Secondary | ICD-10-CM

## 2017-08-19 HISTORY — PX: HYSTERECTOMY ABDOMINAL WITH SALPINGECTOMY: SHX6725

## 2017-08-19 LAB — POCT PREGNANCY, URINE: Preg Test, Ur: NEGATIVE

## 2017-08-19 LAB — ABO/RH: ABO/RH(D): O POS

## 2017-08-19 SURGERY — HYSTERECTOMY, TOTAL, ABDOMINAL, WITH SALPINGECTOMY
Anesthesia: General | Laterality: Bilateral | Wound class: Clean Contaminated

## 2017-08-19 MED ORDER — MAGNESIUM CITRATE PO SOLN
1.0000 | Freq: Once | ORAL | Status: DC | PRN
  Filled 2017-08-19: qty 296

## 2017-08-19 MED ORDER — LOSARTAN POTASSIUM 50 MG PO TABS: 50.0000 mg | ORAL_TABLET | Freq: Every day | ORAL | Status: DC

## 2017-08-19 MED ORDER — SIMETHICONE 80 MG PO CHEW
80.0000 mg | CHEWABLE_TABLET | Freq: Four times a day (QID) | ORAL | Status: DC | PRN
  Administered 2017-08-20: 80 mg via ORAL
  Filled 2017-08-19: qty 1

## 2017-08-19 MED ORDER — LIDOCAINE 5 % EX PTCH
MEDICATED_PATCH | CUTANEOUS | Status: DC | PRN
  Administered 2017-08-19: 1 via TRANSDERMAL

## 2017-08-19 MED ORDER — ONDANSETRON HCL 4 MG PO TABS: 4.0000 mg | ORAL_TABLET | Freq: Four times a day (QID) | ORAL | Status: DC | PRN

## 2017-08-19 MED ORDER — LIDOCAINE 5 % EX PTCH
MEDICATED_PATCH | CUTANEOUS | Status: AC
  Filled 2017-08-19: qty 1

## 2017-08-19 MED ORDER — FENTANYL CITRATE (PF) 100 MCG/2ML IJ SOLN
INTRAMUSCULAR | Status: AC
  Filled 2017-08-19: qty 2

## 2017-08-19 MED ORDER — ONDANSETRON HCL 4 MG/2ML IJ SOLN
INTRAMUSCULAR | Status: DC | PRN
  Administered 2017-08-19: 4 mg via INTRAVENOUS

## 2017-08-19 MED ORDER — OXYCODONE-ACETAMINOPHEN 5-325 MG PO TABS
1.0000 | ORAL_TABLET | ORAL | Status: DC | PRN
  Administered 2017-08-19: 2 via ORAL
  Administered 2017-08-19 (×2): 1 via ORAL
  Administered 2017-08-20: 2 via ORAL
  Administered 2017-08-20 (×2): 1 via ORAL
  Filled 2017-08-19 (×3): qty 1
  Filled 2017-08-19 (×2): qty 2
  Filled 2017-08-19: qty 1

## 2017-08-19 MED ORDER — LACTATED RINGERS IV SOLN
INTRAVENOUS | Status: DC
  Administered 2017-08-19: 08:00:00 via INTRAVENOUS

## 2017-08-19 MED ORDER — ROCURONIUM BROMIDE 50 MG/5ML IV SOLN
INTRAVENOUS | Status: AC
  Filled 2017-08-19: qty 1

## 2017-08-19 MED ORDER — HYDROMORPHONE HCL 1 MG/ML IJ SOLN
0.5000 mg | INTRAMUSCULAR | Status: DC | PRN
  Administered 2017-08-19: 0.5 mg via INTRAVENOUS

## 2017-08-19 MED ORDER — ONDANSETRON HCL 4 MG/2ML IJ SOLN: 4.0000 mg | Freq: Four times a day (QID) | INTRAMUSCULAR | Status: DC | PRN

## 2017-08-19 MED ORDER — ONDANSETRON HCL 4 MG/2ML IJ SOLN
4.0000 mg | Freq: Once | INTRAMUSCULAR | Status: DC | PRN
Start: 2017-08-19 — End: 2017-08-19

## 2017-08-19 MED ORDER — ROCURONIUM BROMIDE 100 MG/10ML IV SOLN
INTRAVENOUS | Status: DC | PRN
  Administered 2017-08-19: 20 mg via INTRAVENOUS
  Administered 2017-08-19: 40 mg via INTRAVENOUS

## 2017-08-19 MED ORDER — LACTATED RINGERS IV SOLN: INTRAVENOUS | Status: DC

## 2017-08-19 MED ORDER — MIDAZOLAM HCL 2 MG/2ML IJ SOLN
INTRAMUSCULAR | Status: DC | PRN
  Administered 2017-08-19: 2 mg via INTRAVENOUS

## 2017-08-19 MED ORDER — HYDROMORPHONE HCL 1 MG/ML IJ SOLN
INTRAMUSCULAR | Status: AC
  Filled 2017-08-19: qty 1

## 2017-08-19 MED ORDER — FENTANYL CITRATE (PF) 100 MCG/2ML IJ SOLN
INTRAMUSCULAR | Status: DC | PRN
  Administered 2017-08-19: 100 ug via INTRAVENOUS

## 2017-08-19 MED ORDER — MENTHOL 3 MG MT LOZG: 1.0000 | LOZENGE | OROMUCOSAL | Status: DC | PRN

## 2017-08-19 MED ORDER — LACTATED RINGERS IV SOLN
INTRAVENOUS | Status: DC
  Administered 2017-08-19: 07:00:00 via INTRAVENOUS

## 2017-08-19 MED ORDER — BISACODYL 10 MG RE SUPP: 10.0000 mg | Freq: Every day | RECTAL | Status: DC | PRN

## 2017-08-19 MED ORDER — ZOLPIDEM TARTRATE 5 MG PO TABS: 5.0000 mg | ORAL_TABLET | Freq: Every evening | ORAL | Status: DC | PRN

## 2017-08-19 MED ORDER — DEXAMETHASONE SODIUM PHOSPHATE 10 MG/ML IJ SOLN
INTRAMUSCULAR | Status: DC | PRN
  Administered 2017-08-19: 10 mg via INTRAVENOUS

## 2017-08-19 MED ORDER — FENTANYL CITRATE (PF) 250 MCG/5ML IJ SOLN
INTRAMUSCULAR | Status: AC
  Filled 2017-08-19: qty 5

## 2017-08-19 MED ORDER — DOCUSATE SODIUM 100 MG PO CAPS
100.0000 mg | ORAL_CAPSULE | Freq: Two times a day (BID) | ORAL | Status: DC
  Administered 2017-08-19 – 2017-08-20 (×4): 100 mg via ORAL
  Filled 2017-08-19 (×4): qty 1

## 2017-08-19 MED ORDER — LIDOCAINE HCL (CARDIAC) 20 MG/ML IV SOLN
INTRAVENOUS | Status: DC | PRN
  Administered 2017-08-19: 100 mg via INTRAVENOUS

## 2017-08-19 MED ORDER — KETOROLAC TROMETHAMINE 30 MG/ML IJ SOLN: 30.0000 mg | Freq: Four times a day (QID) | INTRAMUSCULAR | Status: DC | PRN

## 2017-08-19 MED ORDER — FENTANYL CITRATE (PF) 100 MCG/2ML IJ SOLN
INTRAMUSCULAR | Status: AC
  Administered 2017-08-19: 25 ug via INTRAVENOUS
  Filled 2017-08-19: qty 2

## 2017-08-19 MED ORDER — LIDOCAINE 5 % EX PTCH
1.0000 | MEDICATED_PATCH | CUTANEOUS | Status: DC
  Filled 2017-08-19 (×2): qty 1

## 2017-08-19 MED ORDER — ONDANSETRON HCL 4 MG/2ML IJ SOLN
INTRAMUSCULAR | Status: AC
  Filled 2017-08-19: qty 2

## 2017-08-19 MED ORDER — PANTOPRAZOLE SODIUM 40 MG PO TBEC
40.0000 mg | DELAYED_RELEASE_TABLET | Freq: Every day | ORAL | Status: DC
Start: 2017-08-19 — End: 2017-08-21
  Administered 2017-08-19 – 2017-08-21 (×3): 40 mg via ORAL
  Filled 2017-08-19 (×3): qty 1

## 2017-08-19 MED ORDER — EPHEDRINE SULFATE 50 MG/ML IJ SOLN
INTRAMUSCULAR | Status: DC | PRN
  Administered 2017-08-19: 5 mg via INTRAVENOUS

## 2017-08-19 MED ORDER — SENNOSIDES-DOCUSATE SODIUM 8.6-50 MG PO TABS: 1.0000 | ORAL_TABLET | Freq: Every evening | ORAL | Status: DC | PRN

## 2017-08-19 MED ORDER — PROPOFOL 10 MG/ML IV BOLUS
INTRAVENOUS | Status: AC
  Filled 2017-08-19: qty 20

## 2017-08-19 MED ORDER — IBUPROFEN 600 MG PO TABS
600.0000 mg | ORAL_TABLET | Freq: Four times a day (QID) | ORAL | Status: DC | PRN
  Administered 2017-08-19 – 2017-08-21 (×6): 600 mg via ORAL
  Filled 2017-08-19 (×6): qty 1

## 2017-08-19 MED ORDER — HYDROMORPHONE HCL 1 MG/ML IJ SOLN
INTRAMUSCULAR | Status: AC
  Administered 2017-08-19: 0.5 mg via INTRAVENOUS
  Filled 2017-08-19: qty 1

## 2017-08-19 MED ORDER — FENTANYL CITRATE (PF) 100 MCG/2ML IJ SOLN
25.0000 ug | INTRAMUSCULAR | Status: AC | PRN
  Administered 2017-08-19 (×6): 25 ug via INTRAVENOUS

## 2017-08-19 MED ORDER — KETOROLAC TROMETHAMINE 30 MG/ML IJ SOLN
INTRAMUSCULAR | Status: DC | PRN
  Administered 2017-08-19: 30 mg via INTRAVENOUS

## 2017-08-19 MED ORDER — HYDROMORPHONE HCL 1 MG/ML IJ SOLN
INTRAMUSCULAR | Status: DC | PRN
  Administered 2017-08-19 (×2): .6 mg via INTRAVENOUS

## 2017-08-19 MED ORDER — SUGAMMADEX SODIUM 500 MG/5ML IV SOLN
INTRAVENOUS | Status: DC | PRN
  Administered 2017-08-19: 200 mg via INTRAVENOUS

## 2017-08-19 MED ORDER — CLINDAMYCIN PHOSPHATE 900 MG/50ML IV SOLN
INTRAVENOUS | Status: AC
  Filled 2017-08-19: qty 50

## 2017-08-19 MED ORDER — LIDOCAINE 5 % EX PTCH: 1.0000 | MEDICATED_PATCH | CUTANEOUS | Status: DC

## 2017-08-19 MED ORDER — MIDAZOLAM HCL 2 MG/2ML IJ SOLN
INTRAMUSCULAR | Status: AC
  Filled 2017-08-19: qty 2

## 2017-08-19 MED ORDER — ALUM & MAG HYDROXIDE-SIMETH 200-200-20 MG/5ML PO SUSP: 30.0000 mL | ORAL | Status: DC | PRN

## 2017-08-19 MED ORDER — HYDROMORPHONE HCL 1 MG/ML IJ SOLN
0.2000 mg | INTRAMUSCULAR | Status: DC | PRN
  Administered 2017-08-19: 0.5 mg via INTRAVENOUS
  Filled 2017-08-19: qty 1

## 2017-08-19 MED ORDER — DIPHENHYDRAMINE HCL 25 MG PO CAPS: 25.0000 mg | ORAL_CAPSULE | Freq: Four times a day (QID) | ORAL | Status: DC | PRN

## 2017-08-19 MED ORDER — DIPHENHYDRAMINE HCL 50 MG/ML IJ SOLN: 25.0000 mg | Freq: Four times a day (QID) | INTRAMUSCULAR | Status: DC | PRN

## 2017-08-19 MED ORDER — LACTATED RINGERS IV SOLN
INTRAVENOUS | Status: DC
  Administered 2017-08-19 – 2017-08-20 (×3): via INTRAVENOUS

## 2017-08-19 MED ORDER — PROPOFOL 10 MG/ML IV BOLUS
INTRAVENOUS | Status: DC | PRN
  Administered 2017-08-19: 140 mg via INTRAVENOUS

## 2017-08-19 MED ORDER — KETOROLAC TROMETHAMINE 30 MG/ML IJ SOLN: 30.0000 mg | Freq: Once | INTRAMUSCULAR | Status: DC

## 2017-08-19 SURGICAL SUPPLY — 32 items
CANISTER SUCT 1200ML W/VALVE (MISCELLANEOUS) ×3 IMPLANT
CHLORAPREP W/TINT 26ML (MISCELLANEOUS) ×3 IMPLANT
DERMABOND ADVANCED (GAUZE/BANDAGES/DRESSINGS) ×2
DERMABOND ADVANCED .7 DNX12 (GAUZE/BANDAGES/DRESSINGS) ×1 IMPLANT
DRAPE LAPAROTOMY 100X77 ABD (DRAPES) IMPLANT
DRAPE LAPAROTOMY TRNSV 106X77 (MISCELLANEOUS) ×3 IMPLANT
DRSG TELFA 3X8 NADH (GAUZE/BANDAGES/DRESSINGS) ×3 IMPLANT
ELECT BLADE 6 FLAT ULTRCLN (ELECTRODE) ×3 IMPLANT
ELECT REM PT RETURN 9FT ADLT (ELECTROSURGICAL) ×3
ELECTRODE REM PT RTRN 9FT ADLT (ELECTROSURGICAL) ×1 IMPLANT
GAUZE SPONGE 4X4 12PLY STRL (GAUZE/BANDAGES/DRESSINGS) ×3 IMPLANT
GLOVE BIO SURGEON STRL SZ 6.5 (GLOVE) ×6 IMPLANT
GLOVE BIO SURGEONS STRL SZ 6.5 (GLOVE) ×3
GLOVE INDICATOR 7.0 STRL GRN (GLOVE) ×9 IMPLANT
GOWN STRL REUS W/ TWL LRG LVL3 (GOWN DISPOSABLE) ×3 IMPLANT
GOWN STRL REUS W/TWL LRG LVL3 (GOWN DISPOSABLE) ×6
HEMOSTAT ARISTA ABSORB 3G PWDR (MISCELLANEOUS) ×3 IMPLANT
KIT RM TURNOVER CYSTO AR (KITS) ×3 IMPLANT
LABEL OR SOLS (LABEL) ×3 IMPLANT
LIGASURE IMPACT 36 18CM CVD LR (INSTRUMENTS) ×3 IMPLANT
NS IRRIG 1000ML POUR BTL (IV SOLUTION) ×3 IMPLANT
PACK BASIN MAJOR ARMC (MISCELLANEOUS) ×3 IMPLANT
SPONGE LAP 18X18 5 PK (GAUZE/BANDAGES/DRESSINGS) IMPLANT
STAPLER SKIN PROX 35W (STAPLE) ×3 IMPLANT
SUT CHROMIC 0 CT 1 (SUTURE) ×6 IMPLANT
SUT ETHILON NAB PS2 4-0 18IN (SUTURE) ×3 IMPLANT
SUT MAXON ABS #0 GS21 30IN (SUTURE) ×6 IMPLANT
SUT VIC AB 0 CT1 27 (SUTURE) ×4
SUT VIC AB 0 CT1 27XCR 8 STRN (SUTURE) ×2 IMPLANT
SYR BULB IRRIG 60ML STRL (SYRINGE) ×3 IMPLANT
TRAY FOLEY W/METER SILVER 16FR (SET/KITS/TRAYS/PACK) ×3 IMPLANT
TRAY PREP VAG/GEN (MISCELLANEOUS) ×3 IMPLANT

## 2017-08-19 NOTE — Op Note (Signed)
Procedure(s): HYSTERECTOMY ABDOMINAL WITH BILATERAL SALPINGECTOMY Procedure Note  Sheryl Carr female 46 y.o. 08/19/2017  Indications: The patient is a 46 y.o. [id number] female with enlarged fibroid uterus, menorrhagia  Pre-operative Diagnosis: Enlarged fibroid uterus, menorrhagia  Post-operative Diagnosis: Same  Surgeon: [name], MD  Assistants: Malachi Paradise, MD  Anesthesia: General endotracheal anesthesia  Procedure Details: The patient was seen in the Holding Room. The risks, benefits, complications, treatment options, and expected outcomes were discussed with the patient.  The patient concurred with the proposed plan, giving informed consent.  The site of surgery properly noted/marked. The patient was taken to the Operating Room, identified as [name] and the procedure verified as Procedure(s) (LRB): HYSTERECTOMY ABDOMINAL WITH BILATERAL SALPINGECTOMY (Bilateral). A Time Out was held and the above information confirmed.   After induction of anesthesia, the patient was draped and prepped in the usual sterile manner. Pt was placed in supine position after anesthesia and draped and prepped in the usual sterile manner. Foley catheter was placed.  A Pfannenstiel incision was made and carried through the subcutaneous tissue to the fascia. Fascial incision was made and extended transversely. The rectus muscles were separated. The peritoneum was identified and entered. Peritoneal incision was extended longitudinally.  The above findings were noted. The bowel was packed away from the surgical site.   The round ligaments were identified, cut, and ligated with 0-Vicryl. The anterior peritoneal reflection was incised and the bladder was dissected off the lower uterine segment. The retroperitoneal space was explored and the ureters were identified bilaterally. The right utero-ovarian ligament and proximal fallopian tube were grasped, ligated and cut with the Ligasure  device. The left utero-ovarian ligament and proximal fallopian tube were grasped, ligated and cut with the Ligasure device. Hemostasis  was observed. The fallopian tubes were then transected using the Ligasure device. The uterine vessels were skeletonized, then clamped, ligated and cut with the Ligasure device. Serial pedicles of the cardinal and utero-sacral ligaments were clamped,  ligated and cut with the Ligasure device on the left.  The right pedicles were clamped, cut, and suture ligated with 0-Vicryl. Entrance was made into the vagina and the uterus removed. Vaginal cuff angle sutures were placed incorporating the utero-sacral ligaments for support. The vaginal cuff was then closed with a running stitch of 0-Vicryl. Lavage was carried out until clear. Slight oozing was noted at the vaginal cuff.  Hemostasis was achieved using cautery with the Bovie and application of hemostatic agent Arista.  Hemostasis was observed.   All packing was removed from the abdomen. The fascia was approximated with running sutures of 0-Maxon. Lavage was again carried out. Hemostasis was observed. The skin was approximated with 4-0 Monocryl.  Dermabond was applied to the incision site.  A Lidoderm patch was placed above and below the incision.  Instrument, sponge, and needle counts were correct prior to abdominal closure and at the conclusion of the case.   Findings: The uterus was palpable to 14-16 week size on exam under anesthesia. Enlarged fibroid uterus  Fallopian tubes and ovaries appeared normal. Right ovary contained a small simple cyst, ruptured using the bovie.  Estimated Blood Loss:  300 ml      Drains: foley catheterizationwith  125 ml of clear urine at the end of the procedure.          Total IV Fluids:  1000 ml Lactated Ringer's solution  Specimens: Uterus with cervix, bilateral fallopian tubes         Implants: None  Complications:  None; patient tolerated the procedure well.          Disposition: PACU - hemodynamically stable.         Condition: stable   [name], MD [facility]

## 2017-08-19 NOTE — Anesthesia Postprocedure Evaluation (Signed)
Anesthesia Post Note  Patient: Sheryl Carr  Procedure(s) Performed: Procedure(s) (LRB): HYSTERECTOMY ABDOMINAL WITH BILATERAL SALPINGECTOMY (Bilateral)  Patient location during evaluation: PACU Anesthesia Type: General Level of consciousness: awake and alert Pain management: pain level controlled Vital Signs Assessment: post-procedure vital signs reviewed and stable Respiratory status: spontaneous breathing and respiratory function stable Cardiovascular status: stable Anesthetic complications: no     Last Vitals:  Vitals:   08/19/17 0945 08/19/17 0955  BP: 126/86 125/86  Pulse: 68 66  Resp: 14 13  Temp: 36.4 C   SpO2: 100% 100%    Last Pain:  Vitals:   08/19/17 0955  TempSrc:   PainSc: 7                  KEPHART,WILLIAM K

## 2017-08-19 NOTE — Anesthesia Preprocedure Evaluation (Signed)
Anesthesia Evaluation  Patient identified by MRN, date of birth, ID band Patient awake    Reviewed: Allergy & Precautions, NPO status , Patient's Chart, lab work & pertinent test results  History of Anesthesia Complications Negative for: history of anesthetic complications  Airway Mallampati: II       Dental   Pulmonary neg sleep apnea, neg COPD, former smoker,           Cardiovascular hypertension, Pt. on medications (-) Past MI and (-) CHF (-) dysrhythmias (-) Valvular Problems/Murmurs     Neuro/Psych Anxiety    GI/Hepatic Neg liver ROS, GERD  Medicated,  Endo/Other  neg diabetes  Renal/GU Renal disease (stones)     Musculoskeletal   Abdominal   Peds  Hematology   Anesthesia Other Findings   Reproductive/Obstetrics                             Anesthesia Physical Anesthesia Plan  ASA: II  Anesthesia Plan: General   Post-op Pain Management:    Induction: Intravenous  PONV Risk Score and Plan: 3 and Ondansetron, Dexamethasone, Midazolam and Treatment may vary due to age or medical condition  Airway Management Planned: Oral ETT  Additional Equipment:   Intra-op Plan:   Post-operative Plan:   Informed Consent: I have reviewed the patients History and Physical, chart, labs and discussed the procedure including the risks, benefits and alternatives for the proposed anesthesia with the patient or authorized representative who has indicated his/her understanding and acceptance.     Plan Discussed with:   Anesthesia Plan Comments:         Anesthesia Quick Evaluation

## 2017-08-19 NOTE — Anesthesia Post-op Follow-up Note (Signed)
Anesthesia QCDR form completed.        

## 2017-08-19 NOTE — Anesthesia Procedure Notes (Signed)
Procedure Name: Intubation Date/Time: 08/19/2017 7:02 AM Performed by: [facility] Pre-anesthesia Checklist: Patient identified, Patient being monitored, Timeout performed, Emergency Drugs available and Suction available Patient Re-evaluated:Patient Re-evaluated prior to induction Oxygen Delivery Method: Circle system utilized Preoxygenation: Pre-oxygenation with 100% oxygen Induction Type: IV induction Ventilation: Mask ventilation without difficulty Laryngoscope Size: Mac and 3 Grade View: Grade I Tube type: Oral Tube size: 7.0 mm Number of attempts: 1 Airway Equipment and Method: Stylet Placement Confirmation: ETT inserted through vocal cords under direct vision,  positive ETCO2 and breath sounds checked- equal and bilateral Secured at: 21 cm Tube secured with: Tape Dental Injury: Teeth and Oropharynx as per pre-operative assessment

## 2017-08-19 NOTE — Transfer of Care (Signed)
Immediate Anesthesia Transfer of Care Note  Patient: Sheryl Carr  Procedure(s) Performed: Procedure(s): HYSTERECTOMY ABDOMINAL WITH BILATERAL SALPINGECTOMY (Bilateral)  Patient Location: PACU  Anesthesia Type:General  Level of Consciousness: sedated  Airway & Oxygen Therapy: Patient Spontanous Breathing and Patient connected to face mask oxygen  Post-op Assessment: Report given to RN and Post -op Vital signs reviewed and stable  Post vital signs: Reviewed and stable  Last Vitals:  Vitals:   08/19/17 0613  BP: (!) 128/92  Pulse: 69  Resp: 16  Temp: 36.9 C  SpO2: 100%    Last Pain:  Vitals:   08/19/17 0613  TempSrc: Oral         Complications: No apparent anesthesia complications

## 2017-08-19 NOTE — H&P (Signed)
UPDATE TO PREVIOUS HISTORY AND PHYSICAL  The patient has been seen and examined.  H&P is up to date, no changes noted.  Sheryl Carr is a 46 y.o. [id number] female scheduled for TAH with bilateral salpingectomy. Indications for procedure are enlarged fibroid uterus and h/o abnormal uterine bleeding. Patient can proceed to the OR for scheduled procedure.    [name], MD 08/19/2017 7:27 AM

## 2017-08-20 ENCOUNTER — Encounter: Payer: Self-pay | Admitting: Obstetrics and Gynecology

## 2017-08-20 LAB — CBC
HCT: 28.8 % — ABNORMAL LOW (ref 35.0–47.0)
Hemoglobin: 9.7 g/dL — ABNORMAL LOW (ref 12.0–16.0)
MCH: 27.8 pg (ref 26.0–34.0)
MCHC: 33.6 g/dL (ref 32.0–36.0)
MCV: 82.8 fL (ref 80.0–100.0)
PLATELETS: 265 10*3/uL (ref 150–440)
RBC: 3.48 MIL/uL — ABNORMAL LOW (ref 3.80–5.20)
RDW: 12.8 % (ref 11.5–14.5)
WBC: 7 10*3/uL (ref 3.6–11.0)

## 2017-08-20 LAB — SURGICAL PATHOLOGY

## 2017-08-20 NOTE — Progress Notes (Signed)
1 Day Post-Op Procedure(s) (LRB): HYSTERECTOMY ABDOMINAL WITH BILATERAL SALPINGECTOMY (Bilateral)  Subjective: Patient reports tolerating PO and + flatus.  Patient has not voided  Objective: I have reviewed patient's vital signs, intake and output, medications and labs.  General: alert, cooperative and no distress Resp: Unlabored; regular rate Extremities: extremities normal, atraumatic, no cyanosis or edema and Homans sign is negative, no sign of DVT incision clean dry and intact; no erythema; Dermabond glue covering incision  Assessment: s/p Procedure(s): HYSTERECTOMY ABDOMINAL WITH BILATERAL SALPINGECTOMY (Bilateral): stable and Postop day 1  Plan: Advance diet Encourage ambulation Advance to PO medication  LOS: 1 day    Hassell Done A Haya Hemler 08/20/2017, 8:00 AM

## 2017-08-21 MED ORDER — DOCUSATE SODIUM 100 MG PO CAPS: 100.0000 mg | ORAL_CAPSULE | Freq: Two times a day (BID) | ORAL | 0 refills | Status: DC

## 2017-08-21 MED ORDER — IBUPROFEN 800 MG PO TABS: 800.0000 mg | ORAL_TABLET | Freq: Three times a day (TID) | ORAL | 1 refills | Status: DC

## 2017-08-21 MED ORDER — OXYCODONE-ACETAMINOPHEN 5-325 MG PO TABS: 1.0000 | ORAL_TABLET | ORAL | 0 refills | Status: DC | PRN

## 2017-08-21 NOTE — Progress Notes (Signed)
Dc to home to car via nursing staff

## 2017-08-21 NOTE — Progress Notes (Signed)
D/c teaching completed.  Rx given for home use.  Waiting on ride

## 2017-08-21 NOTE — Discharge Summary (Signed)
Physician Discharge Summary  Patient ID: Sheryl Carr MRN: [id number] DOB/AGE: [birth year] 46 y.o.  Admit date: 08/19/2017 Discharge date: 08/21/2017  Admission Diagnoses: Fibroids  Discharge Diagnoses:  Fibroids  Operative Procedures: Procedure(s): HYSTERECTOMY ABDOMINAL WITH BILATERAL SALPINGECTOMY (Bilateral)  Hospital Course: Uncomplicated .   Significant Diagnostic Studies:  Lab Results  Component Value Date   HGB 9.7 (L) 08/20/2017   HGB 11.0 (L) 08/14/2017   HGB 11.7 (L) 06/25/2017   Lab Results  Component Value Date   HCT 28.8 (L) 08/20/2017   HCT 32.6 (L) 08/14/2017   HCT 34.9 (L) 06/25/2017   CBC Latest Ref Rng & Units 08/20/2017 08/14/2017 06/25/2017  WBC 3.6 - 11.0 K/uL 7.0 4.2 11.0  Hemoglobin 12.0 - 16.0 g/dL 9.7(L) 11.0(L) 11.7(L)  Hematocrit 35.0 - 47.0 % 28.8(L) 32.6(L) 34.9(L)  Platelets 150 - 440 K/uL 265 253 297     Discharged Condition: good  Discharge Exam: Blood pressure (!) 101/56, pulse 82, temperature 98.3 F (36.8 C), temperature source Oral, resp. rate 18, height 5\' 8"  (1.727 m), weight 152 lb (68.9 kg), last menstrual period 08/09/2017, SpO2 100 %. Incision/Wound: clean, dry and no drainage  Disposition: 01-Home or Self Care  Discharge Instructions    Discharge patient    Complete by:  As directed    Discharge disposition:  01-Home or Self Care   Discharge patient date:  08/21/2017     Allergies as of 08/21/2017      Reactions   Pollen Extract    itchy eyes, dull headaches, runny nose   Penicillins Rash   Has patient had a PCN reaction causing immediate rash, facial/tongue/throat swelling, SOB or lightheadedness with hypotension: No Has patient had a PCN reaction causing severe rash involving mucus membranes or skin necrosis: No Has patient had a PCN reaction that required hospitalization: No Has patient had a PCN reaction occurring within the last 10 years: No If all of the above answers are "NO", then may proceed with  Cephalosporin use.      Medication List    TAKE these medications   docusate sodium 100 MG capsule Commonly known as:  COLACE Take 1 capsule (100 mg total) by mouth 2 (two) times daily.   econazole nitrate 1 % cream Apply once a day as needed for rash.   ferrous sulfate 325 (65 FE) MG tablet Take 325 mg by mouth daily with breakfast.   ibuprofen 800 MG tablet Commonly known as:  ADVIL,MOTRIN Take 1 tablet (800 mg total) by mouth 3 (three) times daily.   losartan 50 MG tablet Commonly known as:  COZAAR Take 1 tablet (50 mg total) by mouth daily.   omeprazole 20 MG capsule Commonly known as:  PRILOSEC Take 1 capsule (20 mg total) by mouth 2 (two) times daily before a meal.   ONE-A-DAY WOMENS FORMULA PO Take 1 tablet by mouth daily.   oxyCODONE-acetaminophen 5-325 MG tablet Commonly known as:  ROXICET Take 1-2 tablets by mouth every 4 (four) hours as needed for moderate pain or severe pain.            Discharge Care Instructions        Start     Ordered   08/21/17 0000  oxyCODONE-acetaminophen (ROXICET) 5-325 MG tablet  Every 4 hours PRN     08/21/17 0839   08/21/17 0000  ibuprofen (ADVIL,MOTRIN) 800 MG tablet  3 times daily     08/21/17 0839   08/21/17 0000  Discharge patient    Question Answer  Comment  Discharge disposition 01-Home or Self Care   Discharge patient date 08/21/2017      08/21/17 0839   08/21/17 0000  docusate sodium (COLACE) 100 MG capsule  2 times daily     08/21/17 4103     Follow-up Information    [name], MD. Schedule an appointment as soon as possible for a visit in 1 week(s).   Specialties:  Obstetrics and Gynecology, Radiology Why:  For POST OP check next week Contact information: [address] [place] [address] [phone]           Signed: Alanda Slim Claira Jeter 08/21/2017, 8:41 AM

## 2017-08-27 ENCOUNTER — Encounter: Payer: Self-pay | Admitting: Obstetrics and Gynecology

## 2017-08-27 ENCOUNTER — Telehealth: Payer: Self-pay | Admitting: Obstetrics and Gynecology

## 2017-08-27 ENCOUNTER — Ambulatory Visit (INDEPENDENT_AMBULATORY_CARE_PROVIDER_SITE_OTHER): Payer: Managed Care, Other (non HMO) | Admitting: Obstetrics and Gynecology

## 2017-08-27 VITALS — BP 143/93 | HR 73 | Wt 147.9 lb

## 2017-08-27 DIAGNOSIS — Z9889 Other specified postprocedural states: Secondary | ICD-10-CM

## 2017-08-27 DIAGNOSIS — R350 Frequency of micturition: Secondary | ICD-10-CM | POA: Diagnosis not present

## 2017-08-27 DIAGNOSIS — D500 Iron deficiency anemia secondary to blood loss (chronic): Secondary | ICD-10-CM

## 2017-08-27 DIAGNOSIS — Z90710 Acquired absence of both cervix and uterus: Secondary | ICD-10-CM

## 2017-08-27 LAB — POCT URINALYSIS DIPSTICK
Bilirubin, UA: NEGATIVE
Blood, UA: NEGATIVE
Glucose, UA: NEGATIVE
KETONES UA: NEGATIVE
LEUKOCYTES UA: NEGATIVE
NITRITE UA: NEGATIVE
PH UA: 6 (ref 5.0–8.0)
PROTEIN UA: NEGATIVE
Spec Grav, UA: 1.01 (ref 1.010–1.025)
Urobilinogen, UA: 0.2 E.U./dL

## 2017-08-27 NOTE — Telephone Encounter (Signed)
Please advise 

## 2017-08-27 NOTE — Telephone Encounter (Signed)
Called pt, no answer. LM for pt informing her to resume medications.

## 2017-08-27 NOTE — Progress Notes (Signed)
Post op follow up: Patient reports that she does have occasional pain. The last time she took something for pain was 3 days ago. She is also concerned that her iron may be low. She also mentions that she has urinary frequency that has lasted for 1 week. Denies hematuria.

## 2017-08-27 NOTE — Progress Notes (Addendum)
    OBSTETRICS/GYNECOLOGY POST-OPERATIVE CLINIC VISIT  Subjective:     FEDORA KNISELY is a 46 y.o. female who presents to the clinic 1 weeks status post total abdominal hysterectomy and bilateral salpingectomy for fibroids and menorrhagia. Eating a regular diet without difficulty. Bowel movements are normal. Pain is controlled without any medications.   The following portions of the patient's history were reviewed and updated as appropriate: allergies, current medications, past family history, past medical history, past social history, past surgical history and problem list.  Review of Systems A comprehensive review of systems was negative except for: Genitourinary: positive for urinary frequency - has been ocurring over the past few days. Denies dysuria, hematuria.     Objective:    BP (!) 143/93   Pulse 73   Wt 147 lb 14.4 oz (67.1 kg)   LMP 08/09/2017 (Exact Date)   BMI 22.49 kg/m  General:  alert and no distress  Abdomen: soft, bowel sounds active, non-tender  Incision:   healing well, no drainage, no erythema, no hernia, no seroma, no swelling, no dehiscence, incision well approximated    Pathology:  A. UTERUS WITH CERVIX, BILATERAL FALLOPIAN TUBES; TOTAL HYSTERECTOMY  WITH BILATERAL SALPINGECTOMY:  - CERVIX WITH SQUAMOUS METAPLASIA, CHRONIC CERVICITIS, AND NABOTHIAN  CYSTS.  - PROLIFERATIVE ENDOMETRIUM.  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  - BENIGN PARATUBAL CYST.  - FALLOPIAN TUBES WITH FIMBRIATED END.  - NEGATIVE FOR ATYPIA AND MALIGNANCY.    Labs:  Results for orders placed or performed in visit on 08/27/17  POCT urinalysis dipstick  Result Value Ref Range   Color, UA light yellow    Clarity, UA clear    Glucose, UA negative    Bilirubin, UA negative    Ketones, UA negative    Spec Grav, UA 1.010 1.010 - 1.025   Blood, UA negative    pH, UA 6.0 5.0 - 8.0   Protein, UA negative    Urobilinogen, UA 0.2 0.2 or 1.0  E.U./dL   Nitrite, UA negative    Leukocytes, UA Negative Negative    Lab Results  Component Value Date   HGB 9.7 (L) 08/20/2017    Assessment:   Doing well postoperatively. S/p  TAH with bilateral salpingectomy. Urinary frequency Anemia Plan:   1. Wound care discussed. 2. Operative findings again reviewed. Pathology report discussed.  3. UA today normal, no signs of infection. Advised on adequate hydration and consuming cranberry juice.  4. Activity restrictions: no lifting more than 10-15 pounds and pelvic rest 5. Anticipated return to work: 4 weeks. 6. Follow up: 3-4 weeks for final post-op check. Will recheck Hgb at that time. Patient currently taking iron daily.     [name], MD [facility]

## 2017-08-27 NOTE — Telephone Encounter (Signed)
Pt stated that she forgot to ask at her OV today if she can start taking her losartan (COZAAR) 50 MG tablet again. Pt stated that she was advised to stop the medication before surgery and she wasn't sure if she should start taking the medication again. Please advise. Thanks TNP

## 2017-08-27 NOTE — Telephone Encounter (Signed)
Yes, please tell her that she can resume her home meds.

## 2017-09-02 ENCOUNTER — Other Ambulatory Visit: Payer: Self-pay | Admitting: *Deleted

## 2017-09-02 NOTE — Patient Outreach (Signed)
[place] [facility]) Care Management  09/02/2017  Sheryl Carr [birth year] [id number]   Subjective: Telephone call to patient's home  / mobile number, no answer, left HIPAA compliant voicemail message, and requested call back.   Objective: Per KPN (Knowledge Performance Now, point of care tool), [employer] i[employer]aborate, and chart review, patient hospitalized 08/19/17 -08/21/17 for Fibroids.   Status post HYSTERECTOMY ABDOMINAL WITH BILATERAL SALPINGECTOMY (Bilateral) on 08/19/17.     Assessment: Received [employer] Transition of care referral on 08/27/17. Transition of care follow up pending patient contact.     Plan: RNCM will call patient for 2nd telephone outreach attempt, transition of care follow up, within 10 business days if no return call.   Sheryl Carr, BSN, [place] Management [facility] Telephonic CM Phone: [phone] Fax: [phone]

## 2017-09-04 ENCOUNTER — Ambulatory Visit: Payer: Self-pay | Admitting: *Deleted

## 2017-09-05 ENCOUNTER — Ambulatory Visit: Payer: Self-pay | Admitting: *Deleted

## 2017-09-06 ENCOUNTER — Other Ambulatory Visit: Payer: Self-pay | Admitting: *Deleted

## 2017-09-06 NOTE — Patient Outreach (Signed)
[place] [facility]) Care Management  09/06/2017  Sheryl Carr 01/06/1971 [id number]   Subjective: Telephone call to patient's home  / mobile number, spoke with patient, verified name, and call disconnected.  Telephone call to patient's home  / mobile number, no answer, left HIPAA compliant voicemail message, and requested call back.   Objective: Per KPN (Knowledge Performance Now, point of care tool), [employer] i[employer]aborate, and chart review, patient hospitalized 08/19/17 -08/21/17 for Fibroids.   Status post HYSTERECTOMY ABDOMINAL WITH BILATERAL SALPINGECTOMY (Bilateral) on 08/19/17.     Assessment: Received [employer] Transition of care referral on 08/27/17. Transition of care follow up pending patient contact.     Plan: RNCM will call patient for 3rd telephone outreach attempt, transition of care follow up, within 10 business days if no return call.   Sheryl Carr H. Annia Friendly, BSN, [place] Management [facility] Telephonic CM Phone: [phone] Fax: [phone]

## 2017-09-09 ENCOUNTER — Ambulatory Visit: Payer: Self-pay | Admitting: *Deleted

## 2017-09-09 ENCOUNTER — Other Ambulatory Visit: Payer: Self-pay | Admitting: *Deleted

## 2017-09-09 ENCOUNTER — Encounter: Payer: Self-pay | Admitting: *Deleted

## 2017-09-09 NOTE — Patient Outreach (Signed)
[place] [facility]) Care Management  09/09/2017  Sheryl Carr January 19, 1971 [id number]   Subjective:Telephone call to patient's home / mobile number, spoke with Sheryl Carr, declined to verify date of birth or address).  Sheryl Carr states caller can send information in the mail to the address that [facility] has on file and declines further follow up via phone.  Objective: Per KPN (Knowledge Performance Now, point of care tool), [employer] i[employer]aborate, and chart review, patient hospitalized 08/19/17 -08/21/17 for Fibroids. Status post HYSTERECTOMY ABDOMINAL WITH BILATERAL SALPINGECTOMY (Bilateral) on 08/19/17.    Assessment: Received [employer] Transition of care referral on 08/27/17. Transition of care follow up pending patient contact.     Plan: RNCM will send unsuccessful outreach  letter, [facility] pamphlet, and proceed with case closure, within 10 business days if no return call.   Jaaziah Schulke H. Annia Friendly, BSN, [place] Management [facility] Telephonic CM Phone: [phone] Fax: [phone]

## 2017-09-23 ENCOUNTER — Other Ambulatory Visit: Payer: Self-pay | Admitting: *Deleted

## 2017-09-23 NOTE — Patient Outreach (Signed)
[facility]) Care Management  09/23/2017  Sheryl Carr [birth year] [id number]   No response from patient outreach attempts will proceed with case closure.    Objective: Per KPN (Knowledge Performance Now, point of care tool), [employer] i[employer]aborate, and chart review, patient hospitalized 08/19/17 -08/21/17 for Fibroids. Status post HYSTERECTOMY ABDOMINAL WITH BILATERAL SALPINGECTOMY (Bilateral) on 08/19/17.    Assessment: Received [employer] Transition of care referral on 08/27/17. Transition of care follow up not completed due to patient refusing services and will proceed with case closure.     Plan: RNCM will send case closure request due to patient's refusal of services, to [facility] [place].   Sheryl Carr, BSN, [place] Management [facility] Telephonic CM Phone: [phone] Fax: [phone]

## 2017-09-24 ENCOUNTER — Ambulatory Visit (INDEPENDENT_AMBULATORY_CARE_PROVIDER_SITE_OTHER): Payer: Managed Care, Other (non HMO) | Admitting: Obstetrics and Gynecology

## 2017-09-24 ENCOUNTER — Encounter: Payer: Self-pay | Admitting: Obstetrics and Gynecology

## 2017-09-24 VITALS — BP 130/83 | HR 76 | Ht 68.0 in | Wt 152.2 lb

## 2017-09-24 DIAGNOSIS — Z90710 Acquired absence of both cervix and uterus: Secondary | ICD-10-CM

## 2017-09-24 DIAGNOSIS — D62 Acute posthemorrhagic anemia: Secondary | ICD-10-CM

## 2017-09-24 DIAGNOSIS — Z9889 Other specified postprocedural states: Secondary | ICD-10-CM

## 2017-09-24 NOTE — Progress Notes (Signed)
    OBSTETRICS/GYNECOLOGY POST-OPERATIVE CLINIC VISIT  Subjective:     Sheryl Carr is a 46 y.o. female who presents to the clinic 6 weeks status post total abdominal hysterectomy and bilateral salpingectomy for fibroids and heavy menses. Eating a regular diet without difficulty. Bowel movements are normal. The patient is not having any pain.  The following portions of the patient's history were reviewed and updated as appropriate: allergies, current medications, past family history, past medical history, past social history, past surgical history and problem list.  Review of Systems Pertinent items noted in HPI and remainder of comprehensive ROS otherwise negative.    Objective:    BP 130/83 (BP Location: Left Arm, Patient Position: Sitting, Cuff Size: Normal)   Pulse 76   Ht 5\' 8"  (1.727 m)   Wt 152 lb 3.2 oz (69 kg)   BMI 23.14 kg/m  General:  alert and no distress  Abdomen: soft, bowel sounds active, non-tender  Incision:   no dehiscence, incision well approximated, well healed.   Pelvis:  external genitalia normal, rectovaginal septum normal.  Vagina without discharge. Vaginal cuff well healed. Uterus and cervix surgically absent.    Pathology:  DIAGNOSIS:  A. UTERUS WITH CERVIX, BILATERAL FALLOPIAN TUBES; TOTAL HYSTERECTOMY  WITH BILATERAL SALPINGECTOMY:  - CERVIX WITH SQUAMOUS METAPLASIA, CHRONIC CERVICITIS, AND NABOTHIAN  CYSTS.  - PROLIFERATIVE ENDOMETRIUM.  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  - BENIGN PARATUBAL CYST.  - FALLOPIAN TUBES WITH FIMBRIATED END.  - NEGATIVE FOR ATYPIA AND MALIGNANCY.    Lab Results  Component Value Date   WBC 7.0 08/20/2017   HGB 9.7 (L) 08/20/2017   HCT 28.8 (L) 08/20/2017   MCV 82.8 08/20/2017   PLT 265 08/20/2017    Assessment:    Doing well postoperatively. S/p TAH, bilateral salpingectomy.   Anemia post-op  Plan:   1. Wound care discussed. 2. Operative findings again reviewed.  Pathology report discussed. 3. Patient with mild anemia post-op.  Has been taking iron tablets as prescribed. Can discontinue.  4. Anticipated return to work: 2 days.  Return to work form completed.  5. Activity restrictions: none.  Can resume all activities.  6. Follow up: 4-6 months for annual exam    [name], MD [facility]

## 2017-10-07 ENCOUNTER — Ambulatory Visit
Admission: RE | Admit: 2017-10-07 | Discharge: 2017-10-07 | Disposition: A | Discharge status: Home / Self Care | Payer: Managed Care, Other (non HMO) | Source: Ambulatory Visit | Attending: Family Medicine | Admitting: Family Medicine

## 2017-10-07 DIAGNOSIS — Z1231 Encounter for screening mammogram for malignant neoplasm of breast: Secondary | ICD-10-CM | POA: Diagnosis not present

## 2017-11-19 ENCOUNTER — Ambulatory Visit (INDEPENDENT_AMBULATORY_CARE_PROVIDER_SITE_OTHER): Payer: Managed Care, Other (non HMO) | Admitting: Family Medicine

## 2017-11-19 ENCOUNTER — Encounter: Payer: Self-pay | Admitting: Family Medicine

## 2017-11-19 VITALS — BP 136/85 | HR 75 | Temp 98.0°F | Resp 16 | Ht 68.0 in | Wt 156.0 lb

## 2017-11-19 DIAGNOSIS — E786 Lipoprotein deficiency: Secondary | ICD-10-CM | POA: Diagnosis not present

## 2017-11-19 DIAGNOSIS — K219 Gastro-esophageal reflux disease without esophagitis: Secondary | ICD-10-CM | POA: Diagnosis not present

## 2017-11-19 DIAGNOSIS — Z0000 Encounter for general adult medical examination without abnormal findings: Secondary | ICD-10-CM

## 2017-11-19 DIAGNOSIS — D500 Iron deficiency anemia secondary to blood loss (chronic): Secondary | ICD-10-CM | POA: Diagnosis not present

## 2017-11-19 DIAGNOSIS — R7309 Other abnormal glucose: Secondary | ICD-10-CM

## 2017-11-19 DIAGNOSIS — I10 Essential (primary) hypertension: Secondary | ICD-10-CM | POA: Diagnosis not present

## 2017-11-19 DIAGNOSIS — R5383 Other fatigue: Secondary | ICD-10-CM

## 2017-11-19 NOTE — Patient Instructions (Addendum)
Thank you for coming to the clinic today.  1.  I have not refilled meds today because I am getting an error cannot receive electronic rx  Your current rx - Losartan 50mg  and Omeprazole 20mg  (twice daily) - these were from Dr Luan Pulling, will expire 01/11/18  Let me know by phone or have pharmacy reach [place], when ready for refills in my name  Most likely you have some mild arthritis wear and tear in hips and possibly back/knees  No x-ray needed at this time  Recommend to start taking Tylenol Extra Strength 500mg  tabs - take 1 to 2 tabs per dose (max 1000mg ) every 6-8 hours for pain (take regularly, don't skip a dose for next 7 days), max 24 hour daily dose is 6 tablets or 3000mg . In the future you can repeat the same everyday Tylenol course for 1-2 weeks at a time.  - This is safe to take with anti-inflammatory medicines (Ibuprofen, Advil, Naproxen, Aleve, Meloxicam, Mobic)  2.  DUE for FASTING BLOOD WORK (no food or drink after midnight before the lab appointment, only water or coffee without cream/sugar on the morning of)  SCHEDULE "Lab Only" visit in the morning at the clinic for lab draw in 1 WEEK  - Make sure Lab Only appointment is at about 1 week before your next appointment, so that results will be available  For Lab Results, once available within 2-3 days of blood draw, you can can log in to MyChart online to view your results and a brief explanation. Also, we can discuss results at next follow-up visit.  Please schedule a Follow-up Appointment to: Return in about 6 months (around 05/20/2018) for HTN, history of anemia, GERD.  If you have any other questions or concerns, please feel free to call the clinic or send a message through [place]. You may also schedule an earlier appointment if necessary.  Additionally, you may be receiving a survey about your experience at our clinic within a few days to 1 week by e-mail or mail. We value your feedback.  Nobie Putnam, DO [place]

## 2017-11-19 NOTE — Progress Notes (Signed)
Subjective:    Patient ID: Sheryl Carr, female    DOB: March 04, 1971, 46 y.o.   MRN: [id number]  Sheryl Carr is a 46 y.o. female presenting on 11/19/2017 for Annual Exam and Hypertension   HPI   Here for Annual Physical and due for upcoming lab work.  CHRONIC HTN: Reports not checking BP at home, except if not feeling right will check, usually 120s, life stressors recently attributes elevations to these.  Current Meds - Losartan  50mg  daily (did not try to wean down to half dose at all, not interested) Reports good compliance, took meds today. Tolerating well, w/o complaints. Lifestyle: - Recent limited regular exercise, post-op now 3 months  Follow-up S/p TAH (hysterectomy and bilateral salpingectomy), d/t fibroids and menorrhagia / History of Anemia iron deficiency - Followed by [facility] Dr Marcelline Mates, surgery on 08/19/17, last office visit follow-up on 09/24/17, she was doing well post-op - Does admit that still gets occasional twinges vs pinches of pain in muscles around op site, still has some itching, seems intermittent around incisional scar, in same area as old cesarean scar, had one spot that got very itchy and had some "clear drainage" not pus recent small amount seemed to have resolved now it is "flat" and resolved has some scar tissue in this area, she thought maybe ingrown hair - Remains off iron supplement now for about 2 months, DC'd 30 days after surgery, due for repeat labs - Reports her fatigue is improved overall, now that bleeding has been controlled - Daily MVI is intermittent, not taking on daily stomach  GERD Taking Omeprazole 20mg  daily was rx BID but often forgets PM dose  Additional updates/history - Joint Aches - reports history of "aches" in hips, knees, usually only with cold weather changes from fall to winter, temporary, not chronic problem, worse now that she is not as active exercise. No known dx arthritis - Denies insomnia but admits some  difficulty sleeping at times if wakes up - Additionally admits she occasionally gets forgetful especially trying to remember names of things sometimes, not causing any problem for her, seems more frequent at times, no problems with confusion or other abnormal memory issues  Health Maintenance: UTD Flu Shot 10/10/17 - UTD TDap - UTD Routine HIV Screen negative - UTD Pap Smear  Depression screen [facility] 2/9 11/19/2017 11/12/2016 08/03/2015  Decreased Interest 0 0 0  Down, Depressed, Hopeless 0 0 0  PHQ - 2 Score 0 0 0    Social History   Tobacco Use  . Smoking status: Former Smoker    Last attempt to quit: 09/24/1990    Years since quitting: 27.1  . Smokeless tobacco: Never Used  Substance Use Topics  . Alcohol use: Yes    Comment: social  . Drug use: No    Review of Systems  Constitutional: Negative for activity change, appetite change, chills, diaphoresis, fatigue, fever and unexpected weight change.  HENT: Negative for congestion, hearing loss and sinus pressure.   Eyes: Negative for visual disturbance.  Respiratory: Negative for apnea, cough, chest tightness, shortness of breath and wheezing.   Cardiovascular: Negative for chest pain, palpitations and leg swelling.  Gastrointestinal: Negative for abdominal distention, abdominal pain, anal bleeding, blood in stool, constipation, diarrhea, nausea and vomiting.  Endocrine: Negative for cold intolerance and polyuria.  Genitourinary: Negative for difficulty urinating, dysuria, frequency, hematuria, pelvic pain and urgency.  Musculoskeletal: Negative for arthralgias (Rare hip and knee arthralgia usually only with cold weather onset, no chronic  problem), back pain and neck pain.  Skin: Negative for rash and wound.       Resolved itching, area of drainage from recent abdominal incision  Allergic/Immunologic: Positive for environmental allergies.  Neurological: Negative for dizziness, weakness, light-headedness, numbness and headaches.    Hematological: Negative for adenopathy.  Psychiatric/Behavioral: Positive for sleep disturbance (occasional, due to difficulty with life stressors and "can't turn off mind"). Negative for behavioral problems and dysphoric mood. The patient is not nervous/anxious.    Per HPI unless specifically indicated above     Objective:    BP 136/85   Pulse 75   Temp 98 F (36.7 C) (Oral)   Resp 16   Ht 5\' 8"  (1.727 m)   Wt 156 lb (70.8 kg)   LMP 08/09/2017 (Exact Date)   BMI 23.72 kg/m   Wt Readings from Last 3 Encounters:  11/19/17 156 lb (70.8 kg)  09/24/17 152 lb 3.2 oz (69 kg)  08/27/17 147 lb 14.4 oz (67.1 kg)    Physical Exam  Constitutional: She is oriented to person, place, and time. She appears well-developed and well-nourished. No distress.  Well-appearing, comfortable, cooperative  HENT:  Head: Normocephalic and atraumatic.  Mouth/Throat: Oropharynx is clear and moist.  Frontal / maxillary sinuses non-tender. Nares patent without purulence or edema. Bilateral TMs clear without erythema, effusion or bulging. Oropharynx clear without erythema, exudates, edema or asymmetry.  Eyes: Conjunctivae and EOM are normal. Pupils are equal, round, and reactive to light. Right eye exhibits no discharge. Left eye exhibits no discharge.  Neck: Normal range of motion. Neck supple. No thyromegaly present.  Cardiovascular: Normal rate, regular rhythm, normal heart sounds and intact distal pulses.  No murmur heard. Pulmonary/Chest: Effort normal and breath sounds normal. No respiratory distress. She has no wheezes. She has no rales.  Abdominal: Soft. Bowel sounds are normal. She exhibits no distension and no mass. There is no tenderness.  Patient declined to re-examine her abdominal incision today  Musculoskeletal: Normal range of motion. She exhibits no edema or tenderness.  Upper / Lower Extremities: - Normal muscle tone, strength bilateral upper extremities 5/5, lower extremities 5/5   Lymphadenopathy:    She has no cervical adenopathy.  Neurological: She is alert and oriented to person, place, and time.  Distal sensation intact to light touch all extremities  Skin: Skin is warm and dry. No rash noted. She is not diaphoretic. No erythema.  Psychiatric: She has a normal mood and affect. Her behavior is normal.  Well groomed, good eye contact, normal speech and thoughts  Nursing note and vitals reviewed.  Results for orders placed or performed in visit on 08/27/17  POCT urinalysis dipstick  Result Value Ref Range   Color, UA light yellow    Clarity, UA clear    Glucose, UA negative    Bilirubin, UA negative    Ketones, UA negative    Spec Grav, UA 1.010 1.010 - 1.025   Blood, UA negative    pH, UA 6.0 5.0 - 8.0   Protein, UA negative    Urobilinogen, UA 0.2 0.2 or 1.0 E.U./dL   Nitrite, UA negative    Leukocytes, UA Negative Negative      Assessment & Plan:   Problem List Items Addressed This Visit    Anemia    Improved clinically, now without menorrhagia s/p TAH Chronic problem Prior low iron/ferritin 2016 anemia panel, prior CBC with low Hgb trend Due repeat CBC, anemia panel, now 3 months post op, off iron  supplement for 2 months      Relevant Orders   Hemoglobin A1c   Iron, TIBC and Ferritin Panel   Essential hypertension    Well-controlled HTN - Home BP readings normal infrequent  No known complications  Plan:  1. Continue current BP regimen - Losartan 50mg  daily - defer plan to taper or reduce dose for now 2. Encourage improved lifestyle - low sodium diet, improve regular exercise 3. Continue monitor BP outside office, bring readings to next visit, if persistently >140/90 or new symptoms notify office sooner 4. Follow-up 6 months - return within 1 week for labs      Relevant Orders   COMPLETE METABOLIC PANEL WITH GFR   Fatigue    Improved S/p TAH likely thought due to menorrhagia Re-check CBC iron panel      GERD (gastroesophageal  reflux disease)    Limited control on once daily PPI, did better with BID Will refill, can try improve adherence BID, in future may consider trial titrate down again to daily       Other Visit Diagnoses    Annual physical exam    -  Primary   Relevant Orders   COMPLETE METABOLIC PANEL WITH GFR   Lipid panel   CBC with Differential/Platelet   Hemoglobin A1c   Abnormal glucose       Low HDL (under 40)       Relevant Orders   Lipid panel   Hemoglobin A1c      No orders of the defined types were placed in this encounter.   Follow up plan: Return in about 6 months (around 05/20/2018) for HTN, history of anemia, GERD.   Future labs ordered for 1 week.  Nobie Putnam, [facility] 11/20/2017, 12:06 AM

## 2017-11-20 MED ORDER — LOSARTAN POTASSIUM 50 MG PO TABS: 50.0000 mg | ORAL_TABLET | Freq: Every day | ORAL | 11 refills | Status: DC

## 2017-11-20 MED ORDER — OMEPRAZOLE 20 MG PO CPDR: 20.0000 mg | DELAYED_RELEASE_CAPSULE | Freq: Two times a day (BID) | ORAL | 11 refills | Status: DC

## 2017-11-20 NOTE — Addendum Note (Signed)
Addended by: Olin Hauser on: 11/20/2017 05:33 PM   Modules accepted: Orders

## 2017-11-20 NOTE — Assessment & Plan Note (Signed)
Limited control on once daily PPI, did better with BID Will refill, can try improve adherence BID, in future may consider trial titrate down again to daily

## 2017-11-20 NOTE — Assessment & Plan Note (Signed)
Improved clinically, now without menorrhagia s/p TAH Chronic problem Prior low iron/ferritin 2016 anemia panel, prior CBC with low Hgb trend Due repeat CBC, anemia panel, now 3 months post op, off iron supplement for 2 months

## 2017-11-20 NOTE — Assessment & Plan Note (Signed)
Improved S/p TAH likely thought due to menorrhagia Re-check CBC iron panel

## 2017-11-20 NOTE — Assessment & Plan Note (Signed)
Well-controlled HTN - Home BP readings normal infrequent  No known complications  Plan:  1. Continue current BP regimen - Losartan 50mg  daily - defer plan to taper or reduce dose for now 2. Encourage improved lifestyle - low sodium diet, improve regular exercise 3. Continue monitor BP outside office, bring readings to next visit, if persistently >140/90 or new symptoms notify office sooner 4. Follow-up 6 months - return within 1 week for labs

## 2017-11-29 ENCOUNTER — Other Ambulatory Visit: Payer: Managed Care, Other (non HMO)

## 2017-12-04 ENCOUNTER — Other Ambulatory Visit: Payer: Managed Care, Other (non HMO)

## 2017-12-04 DIAGNOSIS — I10 Essential (primary) hypertension: Secondary | ICD-10-CM

## 2017-12-04 DIAGNOSIS — Z0000 Encounter for general adult medical examination without abnormal findings: Secondary | ICD-10-CM

## 2017-12-04 DIAGNOSIS — E786 Lipoprotein deficiency: Secondary | ICD-10-CM

## 2017-12-04 DIAGNOSIS — D500 Iron deficiency anemia secondary to blood loss (chronic): Secondary | ICD-10-CM

## 2017-12-05 LAB — COMPLETE METABOLIC PANEL WITH GFR
AG RATIO: 1.9 (calc) (ref 1.0–2.5)
ALT: 12 U/L (ref 6–29)
AST: 11 U/L (ref 10–35)
Albumin: 4.3 g/dL (ref 3.6–5.1)
Alkaline phosphatase (APISO): 53 U/L (ref 33–115)
BUN: 18 mg/dL (ref 7–25)
CALCIUM: 8.9 mg/dL (ref 8.6–10.2)
CO2: 28 mmol/L (ref 20–32)
CREATININE: 0.74 mg/dL (ref 0.50–1.10)
Chloride: 102 mmol/L (ref 98–110)
GFR, EST AFRICAN AMERICAN: 113 mL/min/{1.73_m2} (ref >=60)
GFR, EST NON AFRICAN AMERICAN: 97 mL/min/{1.73_m2} (ref >=60)
GLOBULIN: 2.3 g/dL (ref 1.9–3.7)
Glucose, Bld: 105 mg/dL — ABNORMAL HIGH (ref 65–99)
POTASSIUM: 4.2 mmol/L (ref 3.5–5.3)
Sodium: 138 mmol/L (ref 135–146)
TOTAL PROTEIN: 6.6 g/dL (ref 6.1–8.1)
Total Bilirubin: 0.4 mg/dL (ref 0.2–1.2)

## 2017-12-05 LAB — HEMOGLOBIN A1C
EAG (MMOL/L): 5.5 (calc)
Hgb A1c MFr Bld: 5.1 % of total Hgb (ref <5.7)
MEAN PLASMA GLUCOSE: 100 (calc)

## 2017-12-05 LAB — CBC WITH DIFFERENTIAL/PLATELET
BASOS ABS: 40 {cells}/uL (ref 0–200)
Basophils Relative: 0.6 %
EOS PCT: 1.7 %
Eosinophils Absolute: 112 cells/uL (ref 15–500)
HCT: 40.1 % (ref 35.0–45.0)
Hemoglobin: 13.2 g/dL (ref 11.7–15.5)
Lymphs Abs: 1221 cells/uL (ref 850–3900)
MCH: 27.9 pg (ref 27.0–33.0)
MCHC: 32.9 g/dL (ref 32.0–36.0)
MCV: 84.8 fL (ref 80.0–100.0)
MONOS PCT: 7.2 %
MPV: 10.9 fL (ref 7.5–12.5)
NEUTROS PCT: 72 %
Neutro Abs: 4752 cells/uL (ref 1500–7800)
Platelets: 249 10*3/uL (ref 140–400)
RBC: 4.73 10*6/uL (ref 3.80–5.10)
RDW: 12.8 % (ref 11.0–15.0)
Total Lymphocyte: 18.5 %
WBC mixed population: 475 cells/uL (ref 200–950)
WBC: 6.6 10*3/uL (ref 3.8–10.8)

## 2017-12-05 LAB — LIPID PANEL
CHOLESTEROL: 182 mg/dL (ref <200)
HDL: 59 mg/dL (ref >50)
LDL Cholesterol (Calc): 108 mg/dL (calc) — ABNORMAL HIGH
NON-HDL CHOLESTEROL (CALC): 123 mg/dL (ref <130)
Total CHOL/HDL Ratio: 3.1 (calc) (ref <5.0)
Triglycerides: 65 mg/dL (ref <150)

## 2017-12-05 LAB — IRON,TIBC AND FERRITIN PANEL
%SAT: 18 % (ref 11–50)
FERRITIN: 15 ng/mL (ref 10–232)
Iron: 57 ug/dL (ref 40–190)
TIBC: 325 mcg/dL (calc) (ref 250–450)

## 2018-03-05 ENCOUNTER — Ambulatory Visit: Payer: Self-pay | Admitting: Family Medicine

## 2018-03-05 VITALS — BP 128/82 | HR 66 | Temp 98.0°F | Resp 16 | Ht 68.0 in | Wt 156.0 lb

## 2018-03-05 DIAGNOSIS — Z008 Encounter for other general examination: Secondary | ICD-10-CM

## 2018-03-05 DIAGNOSIS — Z0189 Encounter for other specified special examinations: Principal | ICD-10-CM

## 2018-03-05 LAB — GLUCOSE, POCT (MANUAL RESULT ENTRY): POC GLUCOSE: 78 mg/dL (ref 70–99)

## 2018-03-05 NOTE — Progress Notes (Signed)
Sheryl Carr

## 2018-03-05 NOTE — Progress Notes (Signed)
Subjective: Annual biometrics screening labs Patient presents for her annual biometric screening labs only today, as she had her annual physical exam and visit with her primary care provider in December 2018.  Patient's blood pressure initially elevated at 168/102 today upon arrival, repeat manual measurement 128/82.  Patient reports she has not taken her blood pressure medication yet this morning.  Patient is a symptomatic.  Patient monitors her blood pressure at home and reports that it is typically below 140/90.  Patient regularly sees her primary care provider. PCP: Dr. Parks Ranger Patient denies any other issues or concerns.   Assessment Annual biometrics screen  Plan  Labs pending. Encouraged routine visits with primary care provider.  Fasting blood sugar 78 today. Advised patient regarding normal values of blood pressure and told her to monitor her blood pressure at least 2 more times today and then at least daily for the next week.  Advised her to report any abnormal values or symptoms to her primary care provider.

## 2018-03-06 LAB — LIPID PANEL
Chol/HDL Ratio: 3.3 ratio (ref 0.0–4.4)
Cholesterol, Total: 185 mg/dL (ref 100–199)
HDL: 56 mg/dL (ref >39)
LDL Calculated: 115 mg/dL — ABNORMAL HIGH (ref 0–99)
TRIGLYCERIDES: 71 mg/dL (ref 0–149)
VLDL Cholesterol Cal: 14 mg/dL (ref 5–40)

## 2018-03-06 NOTE — Progress Notes (Signed)
Dear Ms. Adelstein, I wanted to let you know that your lipid panel came back.  Your total cholesterol is 185, normal values are between 100 and 199.  Your triglyceride level is 71, normal values are between 0 and 149.  Your VLDL cholesterol is 14, normal values are between 5 and 40. Your HDL cholesterol ("good cholesterol") is 56, normal values are above 39.  Your LDL cholesterol ("bad cholesterol") is 115, normal values are below 99.  Your cholesterol/HDL ratio is 3.3, normal values are between 0 and 4.4 for women or 0 and 5 from men.  I wanted you to be aware of these results so that you can discuss this with your primary care provider at your next regularly scheduled visit.

## 2018-03-13 ENCOUNTER — Encounter: Payer: Self-pay | Admitting: Family Medicine

## 2018-03-13 ENCOUNTER — Ambulatory Visit (INDEPENDENT_AMBULATORY_CARE_PROVIDER_SITE_OTHER): Payer: Managed Care, Other (non HMO) | Admitting: Family Medicine

## 2018-03-13 VITALS — BP 137/85 | HR 73 | Temp 98.3°F | Resp 16 | Ht 68.0 in | Wt 157.0 lb

## 2018-03-13 DIAGNOSIS — R195 Other fecal abnormalities: Secondary | ICD-10-CM

## 2018-03-13 DIAGNOSIS — K591 Functional diarrhea: Secondary | ICD-10-CM | POA: Diagnosis not present

## 2018-03-13 NOTE — Progress Notes (Signed)
Subjective:    Patient ID: [name], female    DOB: [birth year], 47 y.o.   MRN: [id number]  LUZMARIA DEVAUX is a 47 y.o. female presenting on 03/13/2018 for Diarrhea (onset couple of weeks before 03/12 had diarrhea and 03/13 stomach virus and diarrhea getting worst not nauseaus now)   HPI   Diarrhea / Bowel Habit Changes - Reports symptoms started in mid February - Previous normal bowel habits were regular BM 1-3x daily, mostly had well formed stools, not hard or dry - In mid March 2019, she had a suspected "stomach bug" and sick contact with daughter having nausea as well, her symptoms were with nausea vomiting abdominal discomfort and diarrhea, since that point for past 2-3 weeks, has had worsening bowel habits, seems to be mostly liquid stool without any solid, occasionally very loose but never well formed, and still normal frequency for her 1-3 times daily not excessive amounts. Has some slight increased nausea. - Her symptoms are not associated with abdominal pain or cramping - No new diet plan or changes since February 2019 - No medication changes - No recent antibiotics or exposure in hospital to C Diff and no travel for possible parasite - History of GERD, on PPI Omeprazole 20mg  daily usually does not take 2nd BID dose - Admits over past few weeks, increased indigestion and belching, uses TUMs PRN - Denies fever chills abdominal pain unintentional weight loss, blood in stool or dark stools   Depression screen [facility] 2/9 11/19/2017 11/12/2016 08/03/2015  Decreased Interest 0 0 0  Down, Depressed, Hopeless 0 0 0  PHQ - 2 Score 0 0 0    Social History   Tobacco Use  . Smoking status: Former Smoker    Last attempt to quit: 09/24/1990    Years since quitting: 27.4  . Smokeless tobacco: Former Network engineer Use Topics  . Alcohol use: Yes    Comment: social  . Drug use: No    Review of Systems Per HPI unless specifically indicated above     Objective:    BP  137/85   Pulse 73   Temp 98.3 F (36.8 C) (Oral)   Resp 16   Ht 5\' 8"  (1.727 m)   Wt 157 lb (71.2 kg)   LMP 08/09/2017 (Exact Date)   BMI 23.87 kg/m   Wt Readings from Last 3 Encounters:  03/13/18 157 lb (71.2 kg)  03/05/18 156 lb (70.8 kg)  11/19/17 156 lb (70.8 kg)    Physical Exam  Constitutional: She is oriented to person, place, and time. She appears well-developed and well-nourished. No distress.  Well-appearing, comfortable, cooperative  HENT:  Head: Normocephalic and atraumatic.  Mouth/Throat: Oropharynx is clear and moist.  Eyes: Conjunctivae are normal. Right eye exhibits no discharge. Left eye exhibits no discharge.  Cardiovascular: Normal rate.  Pulmonary/Chest: Effort normal.  Abdominal: Soft. She exhibits no distension and no mass. There is no tenderness. There is no rebound and no guarding.  Fairly normal vs slightly hypoactive bowel sounds  Musculoskeletal: She exhibits no edema.  Neurological: She is alert and oriented to person, place, and time.  Skin: Skin is warm and dry. No rash noted. She is not diaphoretic. No erythema.  Psychiatric: She has a normal mood and affect. Her behavior is normal.  Well groomed, good eye contact, normal speech and thoughts  Nursing note and vitals reviewed.      Results for orders placed or performed in visit on 03/05/18  Lipid Profile  Result Value Ref Range   Cholesterol, Total 185 100 - 199 mg/dL   Triglycerides 71 0 - 149 mg/dL   HDL 56 >39 mg/dL   VLDL Cholesterol Cal 14 5 - 40 mg/dL   LDL Calculated 115 (H) 0 - 99 mg/dL   Chol/HDL Ratio 3.3 0.0 - 4.4 ratio  POCT Glucose (CBG)-Manual entry (CPT [phone])  Result Value Ref Range   POC Glucose 78 70 - 99 mg/dl      Assessment & Plan:   Problem List Items Addressed This Visit    None    Visit Diagnoses    Watery stools    -  Primary   Functional diarrhea         Clinically suspected to be more functional GI problem with loose stools and some inc gas  production belching, possibly secondary to recent viral gastro GI illness may have disrupted normal flora. - No prior chronic history of IBS or diarrhea or constipation - No GI red flag symptoms today to suggest other more concerning cause or possible infection - Never had colonoscopy for screening or diagnostic, age 67  Plan Reviewed functional GI symptoms Recommend first try to add fiber supplement as bulking agent Then soon after can add probiotic to help restore normal flora balance - given sample in office, try for 3-4 weeks If not improving then can try dietary exclusion, may target dairy, possibly coffee and other things in diet to see if any potential triggers Lastly discussed may be possibly due to an area of constipation or hard stool causing encopresis with liquid stool moving around it, and we offered to return to have KUB x-ray for diagnostic and or may go ahead and try a few days to week on miralax to see if can treat that underlying problem  Return criteria given, otherwise notify office update or follow-up 4-6 weeks if need to return, consider GI referral / stool studies / culture  No orders of the defined types were placed in this encounter.   Follow up plan: Return in about 1 month (around 04/10/2018), or if symptoms worsen or fail to improve, for GI symptoms / loose stool.  Nobie Putnam, [facility] 03/13/2018, 8:57 AM

## 2018-03-13 NOTE — Patient Instructions (Addendum)
Thank you for coming to the office today.  For loose stools - uncertain exact cause, likely functional, loss of good bacteria with recent illness  Start add fiber supplement Metamucil to help bulk up stool  Try Probiotic OTC follow instructions, may take for up to 3-4 weeks to see if helping then can taper off if need - can try foods such as activa with active cultures as well  Can try dietary exclusion, mostly dairy etc  If these are not working, one other option could be due to constipation called encopresis with liquid stool moving around it and more gas formation, you can then either return for an abdominal x-ray to try to diagnose this, or go ahead and try laxative to clear out any potential constipation  Recommend trying OTC Miralax 17g = 1 capful in large glass water once daily for now, try several days to see if working, goal is soft stool or BM 1-2 times daily, if too loose then reduce dose or try every other day. If not effective may need to increase it to 2 doses at once in AM or may do 1 in morning and 1 in afternoon/evening  - This medicine is very safe and can be used often without any problem and will not make you dehydrated. It is good for use on AS NEEDED BASIS or even MAINTENANCE therapy for longer term for several days to weeks at a time to help regulate bowel movements  Other more natural remedies or preventative treatment: - Increase hydration with water - Increase fiber in diet (high fiber foods = vegetables, leafy greens, oats/grains) - May take OTC Fiber supplement (metamucil powder or pill/gummy) - May try OTC Probiotic  Last resort, consider referral to GI for further discussion, can consider colonoscopy in future if need  Please schedule a Follow-up Appointment to: Return in about 1 month (around 04/10/2018), or if symptoms worsen or fail to improve, for GI symptoms / loose stool.  If you have any other questions or concerns, please feel free to call the office or  send a message through [place]. You may also schedule an earlier appointment if necessary.  Additionally, you may be receiving a survey about your experience at our office within a few days to 1 week by e-mail or mail. We value your feedback.  Nobie Putnam, DO [place]

## 2018-08-13 ENCOUNTER — Other Ambulatory Visit: Payer: Self-pay | Admitting: Obstetrics and Gynecology

## 2018-08-13 DIAGNOSIS — Z1231 Encounter for screening mammogram for malignant neoplasm of breast: Secondary | ICD-10-CM

## 2018-09-04 ENCOUNTER — Encounter: Payer: Self-pay | Admitting: Nurse Practitioner

## 2018-09-04 ENCOUNTER — Other Ambulatory Visit: Payer: Self-pay

## 2018-09-04 ENCOUNTER — Ambulatory Visit (INDEPENDENT_AMBULATORY_CARE_PROVIDER_SITE_OTHER): Payer: Managed Care, Other (non HMO) | Admitting: Nurse Practitioner

## 2018-09-04 VITALS — BP 132/83 | HR 81 | Temp 98.4°F | Ht 68.0 in | Wt 156.4 lb

## 2018-09-04 DIAGNOSIS — R310 Gross hematuria: Secondary | ICD-10-CM | POA: Diagnosis not present

## 2018-09-04 DIAGNOSIS — R82998 Other abnormal findings in urine: Secondary | ICD-10-CM | POA: Diagnosis not present

## 2018-09-04 LAB — POCT URINALYSIS DIPSTICK
Bilirubin, UA: NEGATIVE
Glucose, UA: NEGATIVE
Ketones, UA: NEGATIVE
Nitrite, UA: NEGATIVE
Protein, UA: NEGATIVE
Spec Grav, UA: 1.015 (ref 1.010–1.025)
Urobilinogen, UA: 0.2 E.U./dL
pH, UA: 5 (ref 5.0–8.0)

## 2018-09-04 MED ORDER — CEPHALEXIN 500 MG PO CAPS: 500.0000 mg | ORAL_CAPSULE | Freq: Three times a day (TID) | ORAL | 0 refills | Status: AC

## 2018-09-04 NOTE — Patient Instructions (Addendum)
Sheryl Carr,   Thank you for coming in to clinic today.  1. You have positive Red blood cell and white blood cells (leukocytes) in your urine.   White blood cells and your symptoms are consistent with a UTI. - Take Keflex 500 mg three times daily for 5 days. - We are also sending a culture to make sure this is the right antibiotic.  Because of your clots in your urine, we are sending you for a urology consultation. - also please come back for non-fasting blood work tomorrow to help identify other causes of your bloody urine.  Please schedule a follow-up appointment with Cassell Smiles, AGNP. Return 5-7 days if symptoms worsen or fail to improve.  If you have any other questions or concerns, please feel free to call the clinic or send a message through [place]. You may also schedule an earlier appointment if necessary.  You will receive a survey after today's visit either digitally by e-mail or paper by [employer]. Your experiences and feedback matter to [place].  Please respond so we know how we are doing as we provide care for you.   Cassell Smiles, DNP, AGNP-BC Adult Gerontology Nurse Practitioner [place]

## 2018-09-04 NOTE — Progress Notes (Signed)
Subjective:    Patient ID: Sheryl Carr, female    DOB: 1971-10-31, 47 y.o.   MRN: [id number]  COPELAND LAPIER is a 47 y.o. female presenting on 09/04/2018 for Hematuria (frequent urination, urgency, dysuria and bladder spasm   w/ gross hematuria  x 12 hrs w/ history of kidney stone )   HPI Gross Hematuria Patient first started noting blood in urine with slight tinge at 8:30-9pm yesterday.  This continued worsening and patient was noticing a small amount clot-like material as well before bed.  This am, her void was maroon, deep red in color.  Has cleared some throughout the day today, but remains pink. - Patient endorses symptoms of urinary frequency - every 40 minutes, urinary urgency, pelvic pressure/burning/irritation.  She notes several very brief sharp flank pains. - Has had kidney stones in past, uti also last summer.  Prior episode of gross hematuria with kidney stone, but never had urology followup as patient also needed hysterectomy around the same time.    Social History   Tobacco Use  . Smoking status: Former Smoker    Last attempt to quit: 09/24/1990    Years since quitting: 27.9  . Smokeless tobacco: Former Network engineer Use Topics  . Alcohol use: Yes    Comment: social  . Drug use: No    Review of Systems Per HPI unless specifically indicated above     Objective:    LMP 08/09/2017 (Exact Date)   Wt Readings from Last 3 Encounters:  03/13/18 157 lb (71.2 kg)  03/05/18 156 lb (70.8 kg)  11/19/17 156 lb (70.8 kg)    Physical Exam  Constitutional: She is oriented to person, place, and time. She appears well-developed and well-nourished. No distress.  HENT:  Head: Normocephalic and atraumatic.  Cardiovascular: Normal rate, regular rhythm, S1 normal, S2 normal, normal heart sounds and intact distal pulses.  Pulmonary/Chest: Effort normal and breath sounds normal. No respiratory distress.  Abdominal: Soft. Bowel sounds are normal. She exhibits no  distension. There is no hepatosplenomegaly. There is tenderness in the suprapubic area. There is no rigidity, no guarding and no CVA tenderness. No hernia.  Neurological: She is alert and oriented to person, place, and time.  Skin: Skin is warm and dry. Capillary refill takes less than 2 seconds.  Psychiatric: She has a normal mood and affect. Her behavior is normal. Judgment and thought content normal.  Vitals reviewed.   CT scan 06/25/2017: shows small punctate left renal calculi noted.  Small < 2 cm renal cysts bilaterally as well.   CLINICAL DATA:  Hematuria.  Lower abdominal pain.  EXAM: CT ABDOMEN AND PELVIS WITHOUT CONTRAST  TECHNIQUE: Multidetector CT imaging of the abdomen and pelvis was performed following the standard protocol without IV contrast.  COMPARISON:  None  FINDINGS: Lower chest: No acute abnormality.  Hepatobiliary: No focal liver abnormality is seen. No gallstones, gallbladder wall thickening, or biliary dilatation.  Pancreas: Unremarkable. No pancreatic ductal dilatation or surrounding inflammatory changes.  Spleen: Normal in size without focal abnormality.  Adrenals/Urinary Tract: The adrenal glands are normal. Bilateral kidney cysts are noted measuring up to 1.7 cm. These are incompletely characterized without IV contrast. Small punctate left renal calculi noted. No hydronephrosis or hydroureter. Urinary bladder appears normal.  Stomach/Bowel: The stomach is normal. The small bowel loops have a normal course and caliber. No bowel obstruction the appendix is visualized and appears within normal limits. Of the colon.  Vascular/Lymphatic: Normal appearance of the abdominal aorta.  No enlarged retroperitoneal or mesenteric adenopathy. No enlarged pelvic or inguinal lymph nodes.  Reproductive: Enlarged fibroid uterus identified. Within the left side of the posterior fundus there is a single large fibroid measuring 8.5 cm. Previously this was  reported as measuring 6.2 cm. Centrally, there is lower attenuation within this fibroid, which may be complicated by internal degeneration.  Other: No abdominal wall hernia or abnormality. No abdominopelvic ascites. Small volume of low-density fluid noted within the dependent portion of the pelvis.  Musculoskeletal: No aggressive lytic or sclerotic bone lesion.  IMPRESSION: 1. Since the previous exam there has been interval enlargement of dominant uterine fibroids which now measures 8.5 cm. Centrally this appears low in attenuation which may reflect internal degeneration. Further characterization could be obtained with pelvic sonogram. 2. Nonobstructing left renal calculi.   Electronically Signed   By: Kerby Moors M.D.   On: 06/25/2017 20:50     Results for orders placed or performed in visit on 03/05/18  Lipid Profile  Result Value Ref Range   Cholesterol, Total 185 100 - 199 mg/dL   Triglycerides 71 0 - 149 mg/dL   HDL 56 >39 mg/dL   VLDL Cholesterol Cal 14 5 - 40 mg/dL   LDL Calculated 115 (H) 0 - 99 mg/dL   Chol/HDL Ratio 3.3 0.0 - 4.4 ratio  POCT Glucose (CBG)-Manual entry (CPT [phone])  Result Value Ref Range   POC Glucose 78 70 - 99 mg/dl      Assessment & Plan:   Problem List Items Addressed This Visit      Genitourinary   Gross hematuria - Primary   Relevant Medications   cephALEXin (KEFLEX) 500 MG capsule   Other Relevant Orders   POCT urinalysis dipstick (Completed)   POCT Urinalysis Dipstick (Completed)   Urine Culture   CBC with Differential/Platelet   Comprehensive metabolic panel   Ambulatory referral to Urology    Other Visit Diagnoses    Urine leukocytes       Relevant Medications   cephALEXin (KEFLEX) 500 MG capsule   Other Relevant Orders   CBC with Differential/Platelet   Comprehensive metabolic panel   Ambulatory referral to Urology    # Gross Hematuria, leukocyturia Acute gross hematuria with accompanying signs and symptoms  of UTI.  UA consistent with UTI, so will treat. Differential diagnosis list remains long to include nephrolithiasis, renal pathology/malignancy given renal cysts on last CT scan that were incompletely characterized, bladder pathology/malignancy.  Plan: 1. Treat possible UTI with Keflex 500 mg one tab po tid x 5 days 2. Send for culture for confirmation 3. Referral to urology urgently for likely cystoscopy, additional imaging. Appointment shared with patient before leaving clinic 4. Reassurred patient with history is most likely UTI vs nephrolithiasis, but without additional workup we cannot exclude a malignancy. 5. Labs tomorrow in clinic 6. Followup in next 5-7 days prn.   Meds ordered this encounter  Medications  . cephALEXin (KEFLEX) 500 MG capsule    Sig: Take 1 capsule (500 mg total) by mouth 3 (three) times daily for 5 days.    Dispense:  15 capsule    Refill:  0    Order Specific Question:   Supervising Provider    Answer:   Olin Hauser [[id number]]    Follow up plan: Return 5-7 days if symptoms worsen or fail to improve.  Cassell Smiles, DNP, AGPCNP-BC Adult Gerontology Primary Care Nurse Practitioner [facility] 09/04/2018, 2:50 PM

## 2018-09-05 LAB — COMPREHENSIVE METABOLIC PANEL
AG Ratio: 1.5 (calc) (ref 1.0–2.5)
ALT: 32 U/L — ABNORMAL HIGH (ref 6–29)
AST: 19 U/L (ref 10–35)
Albumin: 4.2 g/dL (ref 3.6–5.1)
Alkaline phosphatase (APISO): 63 U/L (ref 33–115)
BUN: 19 mg/dL (ref 7–25)
CO2: 28 mmol/L (ref 20–32)
Calcium: 9.5 mg/dL (ref 8.6–10.2)
Chloride: 101 mmol/L (ref 98–110)
Creat: 0.76 mg/dL (ref 0.50–1.10)
Globulin: 2.8 g/dL (calc) (ref 1.9–3.7)
Glucose, Bld: 105 mg/dL (ref 65–139)
Potassium: 4.4 mmol/L (ref 3.5–5.3)
Sodium: 138 mmol/L (ref 135–146)
Total Bilirubin: 0.7 mg/dL (ref 0.2–1.2)
Total Protein: 7 g/dL (ref 6.1–8.1)

## 2018-09-05 LAB — CBC WITH DIFFERENTIAL/PLATELET
Basophils Absolute: 30 cells/uL (ref 0–200)
Basophils Relative: 0.5 %
Eosinophils Absolute: 59 cells/uL (ref 15–500)
Eosinophils Relative: 1 %
HCT: 40.4 % (ref 35.0–45.0)
Hemoglobin: 13.5 g/dL (ref 11.7–15.5)
Lymphs Abs: 997 cells/uL (ref 850–3900)
MCH: 28.5 pg (ref 27.0–33.0)
MCHC: 33.4 g/dL (ref 32.0–36.0)
MCV: 85.2 fL (ref 80.0–100.0)
MPV: 10.7 fL (ref 7.5–12.5)
Monocytes Relative: 7.5 %
Neutro Abs: 4372 cells/uL (ref 1500–7800)
Neutrophils Relative %: 74.1 %
Platelets: 249 10*3/uL (ref 140–400)
RBC: 4.74 10*6/uL (ref 3.80–5.10)
RDW: 11.7 % (ref 11.0–15.0)
Total Lymphocyte: 16.9 %
WBC mixed population: 443 cells/uL (ref 200–950)
WBC: 5.9 10*3/uL (ref 3.8–10.8)

## 2018-09-06 LAB — URINE CULTURE
MICRO NUMBER:: 91158700
SPECIMEN QUALITY:: ADEQUATE

## 2018-09-08 ENCOUNTER — Ambulatory Visit: Payer: Managed Care, Other (non HMO) | Admitting: Urology

## 2018-09-08 ENCOUNTER — Encounter: Payer: Self-pay | Admitting: Urology

## 2018-09-08 ENCOUNTER — Ambulatory Visit: Payer: Self-pay | Admitting: Urology

## 2018-09-08 VITALS — BP 125/87 | HR 80 | Resp 16 | Ht 68.0 in | Wt 156.3 lb

## 2018-09-08 DIAGNOSIS — N3001 Acute cystitis with hematuria: Secondary | ICD-10-CM | POA: Diagnosis not present

## 2018-09-08 DIAGNOSIS — R310 Gross hematuria: Secondary | ICD-10-CM | POA: Diagnosis not present

## 2018-09-08 DIAGNOSIS — Z87442 Personal history of urinary calculi: Secondary | ICD-10-CM | POA: Diagnosis not present

## 2018-09-08 DIAGNOSIS — N281 Cyst of kidney, acquired: Secondary | ICD-10-CM

## 2018-09-08 LAB — URINALYSIS, COMPLETE
BILIRUBIN UA: NEGATIVE
GLUCOSE, UA: NEGATIVE
KETONES UA: NEGATIVE
Leukocytes, UA: NEGATIVE
Nitrite, UA: NEGATIVE
PH UA: 5.5 (ref 5.0–7.5)
Protein, UA: NEGATIVE
RBC, UA: NEGATIVE
Specific Gravity, UA: 1.025 (ref 1.005–1.030)
UUROB: 0.2 mg/dL (ref 0.2–1.0)

## 2018-09-08 LAB — MICROSCOPIC EXAMINATION: WBC, UA: NONE SEEN /hpf (ref 0–5)

## 2018-09-08 NOTE — Progress Notes (Signed)
09/08/2018 9:33 AM   Sheryl Carr 07-20-1971 [id number]  Referring provider: Olin Hauser, DO [address] [place], [place] [address]  Chief Complaint  Patient presents with  . Hematuria    HPI: Patient is a 47 -year-old Caucasian female who presents today as a referral from Dr. Nobie Carr for gross hematuria.    She presented to her PCP's office on 09/04/2018  after experiencing gross hematuria associated with frequency, urgency, dysuria and bladder spasms.  Urine culture was positive for E.Coli.   She was placed on Keflex.    She is still having a little of an irritation with urination.  Patient denies any gross hematuria, dysuria or suprapubic/flank pain.  Patient denies any fevers, chills, nausea or vomiting.   Her UA was positive for moderate bacteria.    Patient has a prior history of nephrolithiasis specifically associated with her pregnancies.  She did undergo ureteroscopy for a left-sided stone in the past.  Her stone composition is unknown.    She does not have a prior history of recurrent urinary tract infections, trauma to the genitourinary tract or malignancies of the genitourinary tract.   Her maternal grand father had prostate cancer.    CT Renal stone in 06/2017 revealed the adrenal glands are normal. Bilateral kidney cysts are noted measuring up to 1.7 cm. These are incompletely characterized without IV contrast. Small punctate left renal calculi noted. No hydronephrosis or hydroureter. Urinary bladder appears normal.  She is a former smoker.  She stated she smoked socially while in high school and college.  She quit smoking approximately 30 years ago.  She is not exposed to secondhand smoke.  She has not worked with Sports administrator, trichloroethylene, etc.      PMH: Past Medical History:  Diagnosis Date  . Anemia   . Chronic kidney disease    H/O KIDNEY STONES  . Endometrial polyp 2016   hysteroscopically removed  . GERD  (gastroesophageal reflux disease)   . H/O mammogram 05/11/2015  . Hematuria   . History of kidney stones   . History of uterine fibroid    s/p hysterectomy 08/2017  . HTN (hypertension)    PT WAS TAKEN OFF HER BP MED IN MAY/JUNE OF 2016 BUT BP IS ELEVATED AGAIN    Surgical History: Past Surgical History:  Procedure Laterality Date  . CESAREAN SECTION    . HYSTERECTOMY ABDOMINAL WITH SALPINGECTOMY Bilateral 08/19/2017   Procedure: HYSTERECTOMY ABDOMINAL WITH BILATERAL SALPINGECTOMY;  Surgeon: Sheryl Maid, MD;  Location: [facility] ORS;  Service: Gynecology;  Laterality: Bilateral;  . HYSTEROSCOPY W/D&C N/A 11/14/2015   Procedure: DILATATION AND CURETTAGE /HYSTEROSCOPY;  Surgeon: Sheryl Maid, MD;  Location: [facility] ORS;  Service: Gynecology;  Laterality: N/A;  . KIDNEY STONE SURGERY      Home Medications:  Allergies as of 09/08/2018      Reactions   Pollen Extract    itchy eyes, dull headaches, runny nose   Penicillins Rash   Has patient had a PCN reaction causing immediate rash, facial/tongue/throat swelling, SOB or lightheadedness with hypotension: No Has patient had a PCN reaction causing severe rash involving mucus membranes or skin necrosis: No Has patient had a PCN reaction that required hospitalization: No Has patient had a PCN reaction occurring within the last 10 years: No If all of the above answers are "NO", then may proceed with Cephalosporin use.      Medication List        Accurate as of 09/08/18  9:33 AM. Always use your most recent med list.          cephALEXin 500 MG capsule Commonly known as:  KEFLEX Take 1 capsule (500 mg total) by mouth 3 (three) times daily for 5 days.   econazole nitrate 1 % cream Apply once a day as needed for rash.   losartan 50 MG tablet Commonly known as:  COZAAR Take 1 tablet (50 mg total) by mouth daily.   omeprazole 20 MG capsule Commonly known as:  PRILOSEC Take 1 capsule (20 mg total) by mouth 2 (two) times daily before a  meal.   ONE-A-DAY WOMENS FORMULA PO Take 1 tablet by mouth daily.       Allergies:  Allergies  Allergen Reactions  . Pollen Extract     itchy eyes, dull headaches, runny nose  . Penicillins Rash    Has patient had a PCN reaction causing immediate rash, facial/tongue/throat swelling, SOB or lightheadedness with hypotension: No Has patient had a PCN reaction causing severe rash involving mucus membranes or skin necrosis: No Has patient had a PCN reaction that required hospitalization: No Has patient had a PCN reaction occurring within the last 10 years: No If all of the above answers are "NO", then may proceed with Cephalosporin use.     Family History: Family History  Problem Relation Age of Onset  . Diabetes Mellitus I Mother   . Diabetes Mellitus I Maternal Grandmother   . Prostate cancer Maternal Grandfather   . Diabetes Mellitus I Paternal Grandmother     Social History:  reports that she quit smoking about 27 years ago. She has quit using smokeless tobacco. She reports that she drinks alcohol. She reports that she does not use drugs.  ROS: UROLOGY Frequent Urination?: Yes Hard to postpone urination?: No Burning/pain with urination?: No Get up at night to urinate?: No Leakage of urine?: No Urine stream starts and stops?: No Trouble starting stream?: No Do you have to strain to urinate?: No Blood in urine?: No Urinary tract infection?: No Sexually transmitted disease?: No Injury to kidneys or bladder?: No Painful intercourse?: No Weak stream?: No Currently pregnant?: No Vaginal bleeding?: No Last menstrual period?: n  Gastrointestinal Nausea?: No Vomiting?: No Indigestion/heartburn?: Yes Diarrhea?: Yes Constipation?: No  Constitutional Fever: No Night sweats?: No Weight loss?: No Fatigue?: Yes  Skin Skin rash/lesions?: No Itching?: No  Eyes Blurred vision?: No Double vision?: No  Ears/Nose/Throat Sore throat?: No Sinus problems?:  Yes  Hematologic/Lymphatic Swollen glands?: No Easy bruising?: No  Cardiovascular Leg swelling?: No Chest pain?: No  Respiratory Cough?: No Shortness of breath?: No  Endocrine Excessive thirst?: No  Musculoskeletal Back pain?: Yes Joint pain?: Yes  Neurological Headaches?: No Dizziness?: No  Psychologic Depression?: No Anxiety?: No  Physical Exam: BP 125/87   Pulse 80   Resp 16   Ht 5\' 8"  (1.727 m)   Wt 156 lb 4.8 oz (70.9 kg)   LMP 08/09/2017 (Exact Date)   BMI 23.77 kg/m   Constitutional:  Well nourished. Alert and oriented, No acute distress. HEENT: [place] AT, moist mucus membranes.  Trachea midline, no masses. Cardiovascular: No clubbing, cyanosis, or edema. Respiratory: Normal respiratory effort, no increased work of breathing. Skin: No rashes, bruises or suspicious lesions. Lymph: No cervical or inguinal adenopathy. Neurologic: Grossly intact, no focal deficits, moving all 4 extremities. Psychiatric: Normal mood and affect.  Laboratory Data: Lab Results  Component Value Date   WBC 5.9 09/05/2018   HGB 13.5 09/05/2018  HCT 40.4 09/05/2018   MCV 85.2 09/05/2018   PLT 249 09/05/2018    Lab Results  Component Value Date   CREATININE 0.76 09/05/2018    No results found for: PSA  No results found for: TESTOSTERONE  Lab Results  Component Value Date   HGBA1C 5.1 12/04/2017    Lab Results  Component Value Date   TSH 3.731 08/14/2017       Component Value Date/Time   CHOL 185 03/05/2018 1023   HDL 56 03/05/2018 1023   CHOLHDL 3.3 03/05/2018 1023   CHOLHDL 3.1 12/04/2017 0833   VLDL 11 10/22/2016 0001   LDLCALC 115 (H) 03/05/2018 1023   LDLCALC 108 (H) 12/04/2017 0833    Lab Results  Component Value Date   AST 19 09/05/2018   Lab Results  Component Value Date   ALT 32 (H) 09/05/2018   No components found for: ALKALINEPHOPHATASE No components found for: BILIRUBINTOTAL  No results found for: ESTRADIOL   Urinalysis Moderate  bacteria.  See Epic.  I have reviewed the labs.  Pertinent Imaging: CLINICAL DATA:  Hematuria.  Lower abdominal pain.  EXAM: CT ABDOMEN AND PELVIS WITHOUT CONTRAST  TECHNIQUE: Multidetector CT imaging of the abdomen and pelvis was performed following the standard protocol without IV contrast.  COMPARISON:  None  FINDINGS: Lower chest: No acute abnormality.  Hepatobiliary: No focal liver abnormality is seen. No gallstones, gallbladder wall thickening, or biliary dilatation.  Pancreas: Unremarkable. No pancreatic ductal dilatation or surrounding inflammatory changes.  Spleen: Normal in size without focal abnormality.  Adrenals/Urinary Tract: The adrenal glands are normal. Bilateral kidney cysts are noted measuring up to 1.7 cm. These are incompletely characterized without IV contrast. Small punctate left renal calculi noted. No hydronephrosis or hydroureter. Urinary bladder appears normal.  Stomach/Bowel: The stomach is normal. The small bowel loops have a normal course and caliber. No bowel obstruction the appendix is visualized and appears within normal limits. Of the colon.  Vascular/Lymphatic: Normal appearance of the abdominal aorta. No enlarged retroperitoneal or mesenteric adenopathy. No enlarged pelvic or inguinal lymph nodes.  Reproductive: Enlarged fibroid uterus identified. Within the left side of the posterior fundus there is a single large fibroid measuring 8.5 cm. Previously this was reported as measuring 6.2 cm. Centrally, there is lower attenuation within this fibroid, which may be complicated by internal degeneration.  Other: No abdominal wall hernia or abnormality. No abdominopelvic ascites. Small volume of low-density fluid noted within the dependent portion of the pelvis.  Musculoskeletal: No aggressive lytic or sclerotic bone lesion.  IMPRESSION: 1. Since the previous exam there has been interval enlargement of dominant uterine  fibroids which now measures 8.5 cm. Centrally this appears low in attenuation which may reflect internal degeneration. Further characterization could be obtained with pelvic sonogram. 2. Nonobstructing left renal calculi.   Electronically Signed   By: Kerby Moors M.D.   On: 06/25/2017 20:50 I have independently reviewed the films  Assessment & Plan:    1. Gross hematuria Explained to the patient that there are a number of causes that can be associated with blood in the urine, such as stones, UTI's, damage to the urinary tract and/or cancer - explained that the gross hematuria was likely due to her urinary tract infection but she has a history of indeterminate renal cysts and nephrolithiasis it would be reasonable to pursue a CT urogram at this time -she is agreeable I explained to the patient that a contrast material will be injected into a vein and  that in rare instances, an allergic reaction can result and may even life threatening   The patient denies any allergies to contrast, iodine and/or seafood and is not taking metformin The patient will return following all of the above for discussion of the results.  UA moderate bacteria Urine culture pending - will hold on prescribing antibiotic until sensitivities are available BUN + creatinine pending  Patient is advised that if they should start to experience pain that is not able to be controlled with pain medication, intractable nausea and/or vomiting and/or fevers greater than 103 or shaking chills to contact the office immediately or seek treatment in the emergency department for emergent intervention.    2.  History of nephrolithiasis CTU pending  3.  Indeterminate renal cysts Explained to the patient that most cysts are benign but that hopefully the CTU will confirm their status CTU pending    Return for CTU report .  These notes generated with voice recognition software. I apologize for typographical errors.  Zara Council, PA-C  [facility] [address] [place]  [place], [place] [address] [phone]

## 2018-09-09 LAB — BUN+CREAT
BUN/Creatinine Ratio: 25 — ABNORMAL HIGH (ref 9–23)
BUN: 19 mg/dL (ref 6–24)
Creatinine, Ser: 0.76 mg/dL (ref 0.57–1.00)
GFR calc Af Amer: 109 mL/min/{1.73_m2} (ref >59)
GFR, EST NON AFRICAN AMERICAN: 94 mL/min/{1.73_m2} (ref >59)

## 2018-09-12 LAB — CULTURE, URINE COMPREHENSIVE

## 2018-09-22 ENCOUNTER — Ambulatory Visit
Admission: RE | Admit: 2018-09-22 | Discharge: 2018-09-22 | Disposition: A | Discharge status: Home / Self Care | Payer: Managed Care, Other (non HMO) | Source: Ambulatory Visit | Attending: Urology | Admitting: Urology

## 2018-09-22 DIAGNOSIS — R310 Gross hematuria: Secondary | ICD-10-CM | POA: Diagnosis present

## 2018-09-22 DIAGNOSIS — N200 Calculus of kidney: Secondary | ICD-10-CM | POA: Insufficient documentation

## 2018-09-22 MED ORDER — IOHEXOL 300 MG/ML  SOLN
125.0000 mL | Freq: Once | INTRAMUSCULAR | Status: AC | PRN
  Administered 2018-09-22: 125 mL via INTRAVENOUS

## 2018-09-25 ENCOUNTER — Ambulatory Visit (INDEPENDENT_AMBULATORY_CARE_PROVIDER_SITE_OTHER): Payer: Managed Care, Other (non HMO) | Admitting: Obstetrics and Gynecology

## 2018-09-25 VITALS — BP 149/89 | HR 66 | Ht 69.0 in | Wt 155.0 lb

## 2018-09-25 DIAGNOSIS — Z01419 Encounter for gynecological examination (general) (routine) without abnormal findings: Secondary | ICD-10-CM

## 2018-09-25 DIAGNOSIS — I10 Essential (primary) hypertension: Secondary | ICD-10-CM | POA: Diagnosis not present

## 2018-09-25 DIAGNOSIS — Z01411 Encounter for gynecological examination (general) (routine) with abnormal findings: Secondary | ICD-10-CM | POA: Diagnosis not present

## 2018-09-25 DIAGNOSIS — E785 Hyperlipidemia, unspecified: Secondary | ICD-10-CM

## 2018-09-25 DIAGNOSIS — Z23 Encounter for immunization: Secondary | ICD-10-CM | POA: Diagnosis not present

## 2018-09-25 NOTE — Patient Instructions (Signed)

## 2018-09-25 NOTE — Progress Notes (Signed)
Pt presents today for annual exam. Pt is doing well and has no concerns at this time.

## 2018-09-25 NOTE — Progress Notes (Signed)
GYNECOLOGY ANNUAL PHYSICAL EXAM PROGRESS NOTE  Subjective:    Sheryl Carr is a 47 y.o. G10P2002 female who presents for an annual exam. The patient has no complaints today. The patient is sexually active. The patient wears seatbelts: yes. The patient participates in regular exercise: no. Has the patient ever been transfused or tattooed?: no. The patient reports that there is not domestic violence in her life.   Of note, patient desires to mention that she is currently undergoing workup for gross hematuria. Recently had a CT scan several days ago.    Gynecologic History  Menarche age: 65 Patient's last menstrual period was 08/09/2017 (exact date). Contraception: status post hysterectomy History of STI's: Denies  Last Pap: 07/2015. Results were: normal.  Denies h/o abnormal pap smears. Last mammogram: 10/07/2017. Results were: normal.  Scheduled for repeat next week.    OB History  Gravida Para Term Preterm AB Living  2 2 2  0 0 2  SAB TAB Ectopic Multiple Live Births  0 0 0 0 2    # Outcome Date GA Lbr Len/2nd Weight Sex Delivery Anes PTL Lv  2 Term         LIV  1 Term         LIV    Past Medical History:  Diagnosis Date  . Anemia   . Chronic kidney disease    H/O KIDNEY STONES  . Endometrial polyp 2016   hysteroscopically removed  . GERD (gastroesophageal reflux disease)   . H/O mammogram 05/11/2015  . Hematuria   . History of kidney stones   . History of uterine fibroid    s/p hysterectomy 08/2017  . HTN (hypertension)    PT WAS TAKEN OFF HER BP MED IN MAY/JUNE OF 2016 BUT BP IS ELEVATED AGAIN    Past Surgical History:  Procedure Laterality Date  . CESAREAN SECTION    . HYSTERECTOMY ABDOMINAL WITH SALPINGECTOMY Bilateral 08/19/2017   Procedure: HYSTERECTOMY ABDOMINAL WITH BILATERAL SALPINGECTOMY;  Surgeon: [name], MD;  Location: [facility] ORS;  Service: Gynecology;  Laterality: Bilateral;  . HYSTEROSCOPY W/D&C N/A 11/14/2015   Procedure: DILATATION AND  CURETTAGE /HYSTEROSCOPY;  Surgeon: [name], MD;  Location: [facility] OR[facility];  Service: Gynecology;  Laterality: N/A;  . KIDNEY STONE SURGERY      Family History  Problem Relation Age of Onset  . Diabetes Mellitus I Mother   . Diabetes Mellitus I Maternal Grandmother   . Prostate cancer Maternal Grandfather   . Diabetes Mellitus I Paternal Grandmother     Social History   Socioeconomic History  . Marital status: Married    Spouse name: [name]  . Number of children: 2  . Years of education: college  . Highest education level: Not on file  Occupational History  . Occupation: n/a    Employer: [employer]  . Financial resource strain: Not on file  . Food insecurity:    Worry: Not on file    Inability: Not on file  . Transportation needs:    Medical: Not on file    Non-medical: Not on file  Tobacco Use  . Smoking status: Former Smoker    Last attempt to quit: 09/24/1990    Years since quitting: 28.0  . Smokeless tobacco: Former Network engineer and Sexual Activity  . Alcohol use: Yes    Comment: social  . Drug use: No  . Sexual activity: Yes    Birth control/protection: None  Lifestyle  . Physical activity:  Days per week: Not on file    Minutes per session: Not on file  . Stress: Not on file  Relationships  . Social connections:    Talks on phone: Not on file    Gets together: Not on file    Attends religious service: Not on file    Active member of club or organization: Not on file    Attends meetings of clubs or organizations: Not on file    Relationship status: Not on file  . Intimate partner violence:    Fear of current or ex partner: Not on file    Emotionally abused: Not on file    Physically abused: Not on file    Forced sexual activity: Not on file  Other Topics Concern  . Not on file  Social History Narrative   Patient is married [name]) and lives at home with her husband and one child, one child is in college.   Patient is working  full-time.   Patient has a college education.   Patient is right-handed.   Patient drinks 1-2 cups of coffee daily, soda and tea are occasionally, not daily.    Current Outpatient Medications on File Prior to Visit  Medication Sig Dispense Refill  . econazole nitrate 1 % cream Apply once a day as needed for rash.  2  . losartan (COZAAR) 50 MG tablet Take 1 tablet (50 mg total) by mouth daily. 30 tablet 11  . Multiple Vitamins-Calcium (ONE-A-DAY WOMENS FORMULA PO) Take 1 tablet by mouth daily.     Marland Kitchen omeprazole (PRILOSEC) 20 MG capsule Take 1 capsule (20 mg total) by mouth 2 (two) times daily before a meal. 60 capsule 11   No current facility-administered medications on file prior to visit.     Allergies  Allergen Reactions  . Pollen Extract     itchy eyes, dull headaches, runny nose  . Penicillins Rash    Has patient had a PCN reaction causing immediate rash, facial/tongue/throat swelling, SOB or lightheadedness with hypotension: No Has patient had a PCN reaction causing severe rash involving mucus membranes or skin necrosis: No Has patient had a PCN reaction that required hospitalization: No Has patient had a PCN reaction occurring within the last 10 years: No If all of the above answers are "NO", then may proceed with Cephalosporin use.      Review of Systems Constitutional: negative for chills, fatigue, fevers and sweats Eyes: negative for irritation, redness and visual disturbance Ears, nose, mouth, throat, and face: negative for hearing loss, nasal congestion, snoring and tinnitus Respiratory: negative for asthma, cough, sputum Cardiovascular: negative for chest pain, dyspnea, exertional chest pressure/discomfort, irregular heart beat, palpitations and syncope Gastrointestinal: negative for abdominal pain, change in bowel habits, nausea and vomiting Genitourinary: negative for abnormal menstrual periods, genital lesions, sexual problems and vaginal discharge, dysuria and  urinary incontinence Integument/breast: negative for breast lump, breast tenderness and nipple discharge Hematologic/lymphatic: negative for bleeding and easy bruising Musculoskeletal:negative for back pain and muscle weakness Neurological: negative for dizziness, headaches, vertigo and weakness Endocrine: negative for diabetic symptoms including polydipsia, polyuria and skin dryness Allergic/Immunologic: negative for hay fever and urticaria        Objective:  Blood pressure (!) 149/89, pulse 66, height 5\' 9"  (1.753 m), weight 155 lb (70.3 kg), last menstrual period 08/09/2017. Body mass index is 22.89 kg/m.   General Appearance:    Alert, cooperative, no distress, appears stated age  Head:    Normocephalic, without obvious abnormality, atraumatic  Eyes:    PERRL, conjunctiva/corneas clear, EOM's intact, both eyes  Ears:    Normal external ear canals, both ears  Nose:   Nares normal, septum midline, mucosa normal, no drainage or sinus tenderness  Throat:   Lips, mucosa, and tongue normal; teeth and gums normal  Neck:   Supple, symmetrical, trachea midline, no adenopathy; thyroid: no enlargement/tenderness/nodules; no carotid bruit or JVD  Back:     Symmetric, no curvature, ROM normal, no CVA tenderness  Lungs:     Clear to auscultation bilaterally, respirations unlabored  Chest Wall:    No tenderness or deformity   Heart:    Regular rate and rhythm, S1 and S2 normal, no murmur, rub or gallop  Breast Exam:    No tenderness, masses, or nipple abnormality  Abdomen:     Soft, non-tender, bowel sounds active all four quadrants, no masses, no organomegaly.    Genitalia:    Pelvic:external genitalia normal, vagina without lesions, discharge, or tenderness, rectovaginal septum  normal. Cervix normal in appearance, no cervical motion tenderness, no adnexal masses or tenderness.  Uterus normal size, shape, mobile, regular contours, nontender.  Rectal:    Normal external sphincter.  No hemorrhoids  appreciated. Internal exam not done.   Extremities:   Extremities normal, atraumatic, no cyanosis or edema  Pulses:   2+ and symmetric all extremities  Skin:   Skin color, texture, turgor normal, no rashes or lesions  Lymph nodes:   Cervical, supraclavicular, and axillary nodes normal  Neurologic:   CNII-XII intact, normal strength, sensation and reflexes throughout   .  Labs:  Lab Results  Component Value Date   WBC 5.9 09/05/2018   HGB 13.5 09/05/2018   HCT 40.4 09/05/2018   MCV 85.2 09/05/2018   PLT 249 09/05/2018    Lab Results  Component Value Date   CREATININE 0.76 09/08/2018   BUN 19 09/08/2018   NA 138 09/05/2018   K 4.4 09/05/2018   CL 101 09/05/2018   CO2 28 09/05/2018    Lab Results  Component Value Date   ALT 32 (H) 09/05/2018   AST 19 09/05/2018   ALKPHOS 50 06/25/2017   BILITOT 0.7 09/05/2018    Lab Results  Component Value Date   TSH 3.731 08/14/2017    Lipid Panel     Component Value Date/Time   CHOL 185 03/05/2018 1023   TRIG 71 03/05/2018 1023   HDL 56 03/05/2018 1023   CHOLHDL 3.3 03/05/2018 1023   CHOLHDL 3.1 12/04/2017 0833   VLDL 11 10/22/2016 0001   LDLCALC 115 (H) 03/05/2018 1023   LDLCALC 108 (H) 12/04/2017 0833     Assessment:    Healthy female exam.   Dyslipidemia  Flu vaccine need Essential hypertension  Plan:     Blood tests: None ordered.  Previously performed.  Breast self exam technique reviewed and patient encouraged to perform self-exam monthly. Contraception: status post hysterectomy. Discussed healthy lifestyle modifications. Mammogram scheduled for next week. Pap smear no longer required due to patient's history of hysterectomy.  Desires flu vaccine today.  Dyslidemia - mild with slightly elevated LDL.   Hypertension - currently on meds. Managed by PCP.  Patient can f/u in 1 year or every other year as desired for annual exam.    [name], MD [facility]

## 2018-09-28 ENCOUNTER — Encounter: Payer: Self-pay | Admitting: Obstetrics and Gynecology

## 2018-10-08 ENCOUNTER — Ambulatory Visit: Payer: Managed Care, Other (non HMO) | Admitting: Urology

## 2018-10-08 ENCOUNTER — Ambulatory Visit
Admission: RE | Admit: 2018-10-08 | Discharge: 2018-10-08 | Disposition: A | Discharge status: Home / Self Care | Payer: Managed Care, Other (non HMO) | Source: Ambulatory Visit | Attending: Obstetrics and Gynecology | Admitting: Obstetrics and Gynecology

## 2018-10-08 ENCOUNTER — Encounter: Payer: Self-pay | Admitting: Urology

## 2018-10-08 VITALS — BP 117/81 | HR 69 | Ht 68.0 in | Wt 156.3 lb

## 2018-10-08 DIAGNOSIS — N281 Cyst of kidney, acquired: Secondary | ICD-10-CM

## 2018-10-08 DIAGNOSIS — Z87442 Personal history of urinary calculi: Secondary | ICD-10-CM | POA: Diagnosis not present

## 2018-10-08 DIAGNOSIS — Z1231 Encounter for screening mammogram for malignant neoplasm of breast: Secondary | ICD-10-CM | POA: Insufficient documentation

## 2018-10-08 DIAGNOSIS — R310 Gross hematuria: Secondary | ICD-10-CM

## 2018-10-08 LAB — MICROSCOPIC EXAMINATION
RBC, UA: NONE SEEN /hpf (ref 0–2)
WBC UA: NONE SEEN /HPF (ref 0–5)

## 2018-10-08 LAB — URINALYSIS, COMPLETE
BILIRUBIN UA: NEGATIVE
Glucose, UA: NEGATIVE
Ketones, UA: NEGATIVE
Leukocytes, UA: NEGATIVE
Nitrite, UA: NEGATIVE
PH UA: 5.5 (ref 5.0–7.5)
PROTEIN UA: NEGATIVE
RBC UA: NEGATIVE
Specific Gravity, UA: 1.025 (ref 1.005–1.030)
Urobilinogen, Ur: 0.2 mg/dL (ref 0.2–1.0)

## 2018-10-08 NOTE — Progress Notes (Signed)
10/08/2018 8:56 AM   Sheryl Carr 07-19-1971 [id number]  Referring provider: Olin Hauser, DO [address] [place], [place] [address]  Chief Complaint  Patient presents with  . Follow-up  . Hematuria    HPI: Patient is a 47 year old Caucasian female with a history of hematuria, history of nephrolithiasis and renal cyst who presents today to discuss her CT urogram report.  Background history Patient is a 19 -year-old Caucasian female who presents today as a referral from Dr. Nobie Carr for gross hematuria.  She presented to her PCP's office on 09/04/2018  after experiencing gross hematuria associated with frequency, urgency, dysuria and bladder spasms.  Urine culture was positive for E.Coli.   She was placed on Keflex.   She is still having a little of an irritation with urination.  Patient denies any gross hematuria, dysuria or suprapubic/flank pain.  Patient denies any fevers, chills, nausea or vomiting.   Her UA was positive for moderate bacteria.  Patient has a prior history of nephrolithiasis specifically associated with her pregnancies.  She did undergo ureteroscopy for a left-sided stone in the past.  Her stone composition is unknown.  She does not have a prior history of recurrent urinary tract infections, trauma to the genitourinary tract or malignancies of the genitourinary tract.  Her maternal grand father had prostate cancer.  CT Renal stone in 06/2017 revealed the adrenal glands are normal. Bilateral kidney cysts are noted measuring up to 1.7 cm. These are incompletely characterized without IV contrast. Small punctate left renal calculi noted. No hydronephrosis or hydroureter. Urinary bladder appears normal.  She is a former smoker.  She stated she smoked socially while in high school and college.  She quit smoking approximately 30 years ago.  She is not exposed to secondhand smoke.  She has not worked with Sports administrator, trichloroethylene, etc.        CTU on 09/22/2018 revealed normal adrenals. Punctate nonobstructing 1 mm stones in the upper and lower left kidney. No right renal stones.  No hydronephrosis. Normal caliber ureters, with no ureteral stones.  Small simple renal cysts in both kidneys, largest 1.9 cm in the medial upper left kidney. Additional subcentimeter hypodense renal cortical lesions in the right kidney are too small to characterize and require no follow-up. On delayed imaging, there is no urothelial wall thickening and there are no filling defects in the opacified portions of the bilateral collecting systems or ureters. No bladder stones, wall thickening, masses or diverticula.  Simple 2.5 cm left adnexal cyst, for which no follow-up is required.  Today, she is not having any urethral irritation as she was before.  Patient denies any gross hematuria, dysuria or suprapubic/flank pain.  Patient denies any fevers, chills, nausea or vomiting.   Her UA today is negative.     PMH: Past Medical History:  Diagnosis Date  . Anemia   . Chronic kidney disease    H/O KIDNEY STONES  . Endometrial polyp 2016   hysteroscopically removed  . GERD (gastroesophageal reflux disease)   . H/O mammogram 05/11/2015  . Hematuria   . History of kidney stones   . History of uterine fibroid    s/p hysterectomy 08/2017  . HTN (hypertension)    PT WAS TAKEN OFF HER BP MED IN MAY/JUNE OF 2016 BUT BP IS ELEVATED AGAIN    Surgical History: Past Surgical History:  Procedure Laterality Date  . ABDOMINAL HYSTERECTOMY    . CESAREAN SECTION    . HYSTERECTOMY  ABDOMINAL WITH SALPINGECTOMY Bilateral 08/19/2017   Procedure: HYSTERECTOMY ABDOMINAL WITH BILATERAL SALPINGECTOMY;  Surgeon: Sheryl Maid, MD;  Location: [facility] ORS;  Service: Gynecology;  Laterality: Bilateral;  . HYSTEROSCOPY W/D&C N/A 11/14/2015   Procedure: DILATATION AND CURETTAGE /HYSTEROSCOPY;  Surgeon: Sheryl Maid, MD;  Location: [facility] OR[facility];  Service: Gynecology;  Laterality: N/A;  .  KIDNEY STONE SURGERY      Home Medications:  Allergies as of 10/08/2018      Reactions   Pollen Extract    itchy eyes, dull headaches, runny nose   Penicillins Rash   Has patient had a PCN reaction causing immediate rash, facial/tongue/throat swelling, SOB or lightheadedness with hypotension: No Has patient had a PCN reaction causing severe rash involving mucus membranes or skin necrosis: No Has patient had a PCN reaction that required hospitalization: No Has patient had a PCN reaction occurring within the last 10 years: No If all of the above answers are "NO", then may proceed with Cephalosporin use.      Medication List        Accurate as of 10/08/18  8:56 AM. Always use your most recent med list.          econazole nitrate 1 % cream Apply once a day as needed for rash.   losartan 50 MG tablet Commonly known as:  COZAAR Take 1 tablet (50 mg total) by mouth daily.   omeprazole 20 MG capsule Commonly known as:  PRILOSEC Take 1 capsule (20 mg total) by mouth 2 (two) times daily before a meal.   ONE-A-DAY WOMENS FORMULA PO Take 1 tablet by mouth daily.       Allergies:  Allergies  Allergen Reactions  . Pollen Extract     itchy eyes, dull headaches, runny nose  . Penicillins Rash    Has patient had a PCN reaction causing immediate rash, facial/tongue/throat swelling, SOB or lightheadedness with hypotension: No Has patient had a PCN reaction causing severe rash involving mucus membranes or skin necrosis: No Has patient had a PCN reaction that required hospitalization: No Has patient had a PCN reaction occurring within the last 10 years: No If all of the above answers are "NO", then may proceed with Cephalosporin use.     Family History: Family History  Problem Relation Age of Onset  . Diabetes Mellitus I Mother   . Diabetes Mellitus I Maternal Grandmother   . Prostate cancer Maternal Grandfather   . Diabetes Mellitus I Paternal Grandmother   . Breast cancer  Neg Hx     Social History:  reports that she quit smoking about 28 years ago. She has quit using smokeless tobacco. She reports that she drinks alcohol. She reports that she does not use drugs.  ROS: UROLOGY Frequent Urination?: No Hard to postpone urination?: No Burning/pain with urination?: No Get up at night to urinate?: No Leakage of urine?: No Urine stream starts and stops?: No Trouble starting stream?: No Do you have to strain to urinate?: No Blood in urine?: No Urinary tract infection?: No Sexually transmitted disease?: No Injury to kidneys or bladder?: No Painful intercourse?: No Weak stream?: No Currently pregnant?: No Vaginal bleeding?: No Last menstrual period?: Hysterectomy  Gastrointestinal Nausea?: No Vomiting?: No Indigestion/heartburn?: No Diarrhea?: No Constipation?: No  Constitutional Fever: No Night sweats?: No Weight loss?: No Fatigue?: No  Skin Skin rash/lesions?: No Itching?: No  Eyes Blurred vision?: No Double vision?: No  Ears/Nose/Throat Sore throat?: No Sinus problems?: No  Hematologic/Lymphatic Swollen glands?: No Easy bruising?: No  Cardiovascular Leg swelling?: No Chest pain?: No  Respiratory Cough?: No Shortness of breath?: No  Endocrine Excessive thirst?: No  Musculoskeletal Back pain?: Yes Joint pain?: Yes  Neurological Headaches?: No Dizziness?: No  Psychologic Depression?: No Anxiety?: No  Physical Exam: BP 117/81 (BP Location: Left Arm, Patient Position: Sitting, Cuff Size: Normal)   Pulse 69   Ht 5\' 8"  (1.727 m)   Wt 156 lb 4.8 oz (70.9 kg)   LMP 08/09/2017 (Exact Date)   BMI 23.77 kg/m   Constitutional: Well nourished. Alert and oriented, No acute distress. HEENT: [place] AT, moist mucus membranes. Trachea midline, no masses. Cardiovascular: No clubbing, cyanosis, or edema. Respiratory: Normal respiratory effort, no increased work of breathing. GI: Abdomen is soft, non tender, non distended, no  abdominal masses. Liver and spleen not palpable.  No hernias appreciated.  Stool sample for occult testing is not indicated.   GU: No CVA tenderness.  No bladder fullness or masses.   Skin: No rashes, bruises or suspicious lesions. Lymph: No cervical or inguinal adenopathy. Neurologic: Grossly intact, no focal deficits, moving all 4 extremities. Psychiatric: Normal mood and affect.  Laboratory Data: Lab Results  Component Value Date   WBC 5.9 09/05/2018   HGB 13.5 09/05/2018   HCT 40.4 09/05/2018   MCV 85.2 09/05/2018   PLT 249 09/05/2018    Lab Results  Component Value Date   CREATININE 0.76 09/08/2018    No results found for: PSA  No results found for: TESTOSTERONE  Lab Results  Component Value Date   HGBA1C 5.1 12/04/2017    Lab Results  Component Value Date   TSH 3.731 08/14/2017       Component Value Date/Time   CHOL 185 03/05/2018 1023   HDL 56 03/05/2018 1023   CHOLHDL 3.3 03/05/2018 1023   CHOLHDL 3.1 12/04/2017 0833   VLDL 11 10/22/2016 0001   LDLCALC 115 (H) 03/05/2018 1023   LDLCALC 108 (H) 12/04/2017 0833    Lab Results  Component Value Date   AST 19 09/05/2018   Lab Results  Component Value Date   ALT 32 (H) 09/05/2018   No components found for: ALKALINEPHOPHATASE No components found for: BILIRUBINTOTAL  No results found for: ESTRADIOL   Urinalysis Negative.  See Epic.  I have reviewed the labs.  Pertinent Imaging: CLINICAL DATA:  Gross hematuria. History of nephrolithiasis. Recurrent urinary tract infections. Microhematuria. Prior hysterectomy.  EXAM: CT ABDOMEN AND PELVIS WITHOUT AND WITH CONTRAST  TECHNIQUE: Multidetector CT imaging of the abdomen and pelvis was performed following the standard protocol before and following the bolus administration of intravenous contrast.  CONTRAST:  176mL OMNIPAQUE IOHEXOL 300 MG/ML  SOLN  COMPARISON:  06/25/2017 CT abdomen/pelvis.  FINDINGS: Lower chest: No significant  pulmonary nodules or acute consolidative airspace disease.  Hepatobiliary: Normal liver size. No liver mass. Normal gallbladder with no radiopaque cholelithiasis. No biliary ductal dilatation.  Pancreas: Normal, with no mass or duct dilation.  Spleen: Normal size. No mass.  Adrenals/Urinary Tract: Normal adrenals. Punctate nonobstructing 1 mm stones in the upper and lower left kidney. No right renal stones. No hydronephrosis. Normal caliber ureters, with no ureteral stones. Small simple renal cysts in both kidneys, largest 1.9 cm in the medial upper left kidney. Additional subcentimeter hypodense renal cortical lesions in the right kidney are too small to characterize and require no follow-up. On delayed imaging, there is no urothelial wall thickening and there are no filling defects in the opacified portions of the bilateral collecting systems or  ureters. No bladder stones, wall thickening, masses or diverticula.  Stomach/Bowel: Normal non-distended stomach. Normal caliber small bowel with no small bowel wall thickening. Normal appendix. Normal large bowel with no diverticulosis, large bowel wall thickening or pericolonic fat stranding.  Vascular/Lymphatic: Normal caliber abdominal aorta. Patent portal, splenic, hepatic and renal veins. No pathologically enlarged lymph nodes in the abdomen or pelvis.  Reproductive: Simple 2.5 cm left adnexal cyst (series 7/image 71). No right adnexal mass. Status post hysterectomy, with no abnormal findings at the vaginal cuff.  Other: No pneumoperitoneum, ascites or focal fluid collection.  Musculoskeletal: No aggressive appearing focal osseous lesions. Mild thoracolumbar spondylosis.  IMPRESSION: 1. Punctate nonobstructing left nephrolithiasis. No hydronephrosis. No ureteral or bladder stones. 2. No suspicious renal cortical masses. No evidence of urothelial lesions. 3. Simple 2.5 cm left adnexal cyst, for which no follow-up  is required. This recommendation follows ACR consensus guidelines: White Paper of the ACR Incidental Findings Committee II on Adnexal Findings. J Am Coll Radiol [id number].   Electronically Signed   By: Ilona Sorrel M.D.   On: 09/22/2018 09:48  I have independently reviewed the films with patient and noted 3 punctate stones in left kidney, bilateral renal cysts and left ovarian cyst.    Assessment & Plan:    1. Gross hematuria CTU did not reveal source of hematuria - likely due to UTI Did offer the patient to undergo cystoscopy as we could not be absolutely certain there was not a bladder cancer responsible for the episode of gross hematuria associated with her urinary tract infection although this would be rare She has had 2 UAs that have been negative for microscopic hematuria and has not reported any further gross hematuria at this time  She would like to defer the cystoscopy and will return in 3 months for repeat urinalysis to ensure no hematuria persists or sooner if she should experience gross hematuria or microscopic hematuria is seen by another provider  2.  History of nephrolithiasis Punctate non obstructing left nephrolithiasis No intervention is warranted at this time  3.  Indeterminate renal cysts Bilateral renal cysts-no further follow-up warranted   Return in about 3 months (around 01/08/2019) for UA only .  These notes generated with voice recognition software. I apologize for typographical errors.  Zara Council, PA-C  [facility] [address] [place]  [place], [place] [address] [phone]

## 2018-10-09 ENCOUNTER — Other Ambulatory Visit: Payer: Self-pay | Admitting: Obstetrics and Gynecology

## 2018-10-09 DIAGNOSIS — R928 Other abnormal and inconclusive findings on diagnostic imaging of breast: Secondary | ICD-10-CM

## 2018-10-09 DIAGNOSIS — R921 Mammographic calcification found on diagnostic imaging of breast: Secondary | ICD-10-CM

## 2018-10-09 DIAGNOSIS — N6310 Unspecified lump in the right breast, unspecified quadrant: Secondary | ICD-10-CM

## 2018-10-21 ENCOUNTER — Ambulatory Visit
Admission: RE | Admit: 2018-10-21 | Discharge: 2018-10-21 | Disposition: A | Discharge status: Home / Self Care | Payer: Managed Care, Other (non HMO) | Source: Ambulatory Visit | Attending: Family Medicine | Admitting: Family Medicine

## 2018-10-21 ENCOUNTER — Ambulatory Visit (INDEPENDENT_AMBULATORY_CARE_PROVIDER_SITE_OTHER): Payer: Managed Care, Other (non HMO) | Admitting: Family Medicine

## 2018-10-21 ENCOUNTER — Encounter: Payer: Self-pay | Admitting: Family Medicine

## 2018-10-21 VITALS — BP 138/78 | HR 72 | Temp 97.8°F | Resp 16 | Ht 68.0 in | Wt 157.0 lb

## 2018-10-21 DIAGNOSIS — M1811 Unilateral primary osteoarthritis of first carpometacarpal joint, right hand: Secondary | ICD-10-CM | POA: Diagnosis not present

## 2018-10-21 DIAGNOSIS — G8929 Other chronic pain: Secondary | ICD-10-CM | POA: Diagnosis not present

## 2018-10-21 DIAGNOSIS — M79644 Pain in right finger(s): Secondary | ICD-10-CM | POA: Insufficient documentation

## 2018-10-21 MED ORDER — NAPROXEN 500 MG PO TABS: 500.0000 mg | ORAL_TABLET | Freq: Two times a day (BID) | ORAL | 1 refills | Status: DC

## 2018-10-21 NOTE — Progress Notes (Signed)
Subjective:    Patient ID: Sheryl Carr, female    DOB: [birth year], 47 y.o.   MRN: [id number]  Sheryl Carr is a 47 y.o. female presenting on 10/21/2018 for Hand Pain (R thumb)  Of note patient initially scheduled on mychart today for annual physical, however she was already seen by her GYN provider for annual and had preventative. Today we focused on her Right Thumb/Hand Pain.  HPI   Right Thumb / Hand Pain Reports symptoms onset 2001-2002 when living back in [place], she had episodes of numbness and tingling in certain movements, she was sent to Neurology and Rheumatology, they did lab testing, also did circulatory testing, ultimately thought may be carpal tunnel - Now after moved to [place], she discussed with PCP and was again referred to [facility] to re-evaluate it, they did MRI testing brain / C spine, lab testing rheum testing, Nerve Conduction Study, ultimately   - Patient is Right handed - Describes R great thumb at base, localized pain and symptoms, now with progressive worsening in past 2-3 months, has a mild dull ache most of the time, worse with actual action or activity to use hand, primarily pain with activity almost all daily activities, even holding cup or doing fine tasks differently, she is avoiding excessive use of her R thumb due to provoking injury, if does thumb opposition for grip it is weaker and more stiffness - Taking Tylenol rarely for other issues, sometimes for Thumb with benefit. Using heat with relief. - She is trying to avoid using her R hand as much, switched to L hand mouse - She states hurts to wear R hand splint - Admits some R thumb joint swelling - Admits episodic numbness within R hand Denies redness, swelling, other joint problem, injury trauma  Health Maintenance: UTD Flu vaccine 09/25/18  Depression screen [facility] 2/9 10/21/2018 11/19/2017 11/12/2016  Decreased Interest 0 0 0  Down, Depressed, Hopeless 0 0 0  PHQ - 2 Score 0 0 0     Past Medical History:  Diagnosis Date  . Anemia   . Chronic kidney disease    H/O KIDNEY STONES  . Endometrial polyp 2016   hysteroscopically removed  . GERD (gastroesophageal reflux disease)   . H/O mammogram 05/11/2015  . Hematuria   . History of kidney stones   . History of uterine fibroid    s/p hysterectomy 08/2017  . HTN (hypertension)    PT WAS TAKEN OFF HER BP MED IN MAY/JUNE OF 2016 BUT BP IS ELEVATED AGAIN   Past Surgical History:  Procedure Laterality Date  . ABDOMINAL HYSTERECTOMY    . CESAREAN SECTION    . HYSTERECTOMY ABDOMINAL WITH SALPINGECTOMY Bilateral 08/19/2017   Procedure: HYSTERECTOMY ABDOMINAL WITH BILATERAL SALPINGECTOMY;  Surgeon: [name], MD;  Location: [facility] ORS;  Service: Gynecology;  Laterality: Bilateral;  . HYSTEROSCOPY W/D&C N/A 11/14/2015   Procedure: DILATATION AND CURETTAGE /HYSTEROSCOPY;  Surgeon: [name], MD;  Location: [facility] ORS;  Service: Gynecology;  Laterality: N/A;  . KIDNEY STONE SURGERY     Social History   Socioeconomic History  . Marital status: Married    Spouse name: Sheryl Carr  . Number of children: 2  . Years of education: college  . Highest education level: Not on file  Occupational History  . Occupation: n/a    Employer: [employer]  . Financial resource strain: Not on file  . Food insecurity:    Worry: Not on file    Inability:  Not on file  . Transportation needs:    Medical: Not on file    Non-medical: Not on file  Tobacco Use  . Smoking status: Former Smoker    Last attempt to quit: 09/24/1990    Years since quitting: 28.0  . Smokeless tobacco: Former Network engineer and Sexual Activity  . Alcohol use: Yes    Comment: social  . Drug use: No  . Sexual activity: Yes    Birth control/protection: None  Lifestyle  . Physical activity:    Days per week: Not on file    Minutes per session: Not on file  . Stress: Not on file  Relationships  . Social connections:    Talks on phone: Not  on file    Gets together: Not on file    Attends religious service: Not on file    Active member of club or organization: Not on file    Attends meetings of clubs or organizations: Not on file    Relationship status: Not on file  . Intimate partner violence:    Fear of current or ex partner: Not on file    Emotionally abused: Not on file    Physically abused: Not on file    Forced sexual activity: Not on file  Other Topics Concern  . Not on file  Social History Narrative   Patient is married Sheryl Carr) and lives at home with her husband and one child, one child is in college.   Patient is working full-time.   Patient has a college education.   Patient is right-handed.   Patient drinks 1-2 cups of coffee daily, soda and tea are occasionally, not daily.   Family History  Problem Relation Age of Onset  . Diabetes Mellitus I Mother   . Diabetes Mellitus I Maternal Grandmother   . Prostate cancer Maternal Grandfather   . Diabetes Mellitus I Paternal Grandmother   . Breast cancer Neg Hx    Current Outpatient Medications on File Prior to Visit  Medication Sig  . econazole nitrate 1 % cream Apply once a day as needed for rash.  . losartan (COZAAR) 50 MG tablet Take 1 tablet (50 mg total) by mouth daily.  . Multiple Vitamins-Calcium (ONE-A-DAY WOMENS FORMULA PO) Take 1 tablet by mouth daily.   Marland Kitchen omeprazole (PRILOSEC) 20 MG capsule Take 1 capsule (20 mg total) by mouth 2 (two) times daily before a meal.   No current facility-administered medications on file prior to visit.     Review of Systems Per HPI unless specifically indicated above     Objective:    BP 138/78   Pulse 72   Temp 97.8 F (36.6 C) (Oral)   Resp 16   Ht 5\' 8"  (1.727 m)   Wt 157 lb (71.2 kg)   LMP 08/09/2017 (Exact Date)   BMI 23.87 kg/m   Wt Readings from Last 3 Encounters:  10/21/18 157 lb (71.2 kg)  10/08/18 156 lb 4.8 oz (70.9 kg)  09/25/18 155 lb (70.3 kg)    Physical Exam  Constitutional: She is  oriented to person, place, and time. She appears well-developed and well-nourished. No distress.  Well-appearing, comfortable, cooperative  HENT:  Head: Normocephalic and atraumatic.  Mouth/Throat: Oropharynx is clear and moist.  Cardiovascular: Normal rate.  Pulmonary/Chest: Effort normal.  Musculoskeletal: She exhibits no edema.  Right Hand/Wrist Inspection: Normal appearance, symmetrical, no bulky MCP joints, no edema or erythema. Palpation: Localized tender to base of R CMC joint, also some over  distal aspect of radial wrist near anatomical snuff box and APL/EPB tendons radially - Otherwise fingers, hand, carpals non tender ROM: full active wrist ROM flex / ext, ulnar / radial deviation. Some slight limited mobility with R Thumb opposition. Special Testing:  Finkelstein's test mildly positive for pain. Negative Tinel's and Phalen/Reverse Phalen for numbness, has some discomfort with R Thumb Strength: 5/5 grip, wrist flex/ext Neurovascular: distally intact  Neurological: She is alert and oriented to person, place, and time.  Skin: Skin is warm and dry. No rash noted. She is not diaphoretic. No erythema.  Psychiatric: She has a normal mood and affect. Her behavior is normal.  Well groomed, good eye contact, normal speech and thoughts  Nursing note and vitals reviewed.    I have personally reviewed the radiology report from Right Thumb and Hand X-ray on 10/21/18.  CLINICAL DATA:  Thumb pain for several years increasing over the past few months, no known injury, initial encounter  EXAM: RIGHT THUMB 2+V  COMPARISON:  None.  FINDINGS: Mild degenerative changes are noted the first [facility] joint. No acute fracture or dislocation is noted.  IMPRESSION: Mild degenerative change without acute abnormality.   Electronically Signed   By: Inez Catalina M.D.   On: 10/21/2018 10:55  -------------------   CLINICAL DATA:  Thumb pain for several years  EXAM: RIGHT HAND - COMPLETE  3+ VIEW  COMPARISON:  None.  FINDINGS: There is no evidence of fracture or dislocation. No soft tissue abnormality is seen. Mild degenerative changes at the first [facility] joint are noted.  IMPRESSION: No acute abnormality noted.   Electronically Signed   By: Inez Catalina M.D.   On: 10/21/2018 10:55 Results for orders placed or performed in visit on 10/08/18  Microscopic Examination  Result Value Ref Range   WBC, UA None seen 0 - 5 /hpf   RBC, UA None seen 0 - 2 /hpf   Epithelial Cells (non renal) 0-10 0 - 10 /hpf   Mucus, UA Present (A) Not Estab.   Bacteria, UA Moderate (A) None seen/Few  Urinalysis, Complete  Result Value Ref Range   Specific Gravity, UA 1.025 1.005 - 1.030   pH, UA 5.5 5.0 - 7.5   Color, UA Yellow Yellow   Appearance Ur Cloudy (A) Clear   Leukocytes, UA Negative Negative   Protein, UA Negative Negative/Trace   Glucose, UA Negative Negative   Ketones, UA Negative Negative   RBC, UA Negative Negative   Bilirubin, UA Negative Negative   Urobilinogen, Ur 0.2 0.2 - 1.0 mg/dL   Nitrite, UA Negative Negative   Microscopic Examination See below:       Assessment & Plan:   Problem List Items Addressed This Visit    None    Visit Diagnoses    Chronic pain of right thumb    -  Primary   Relevant Medications   naproxen (NAPROSYN) 500 MG tablet   Other Relevant Orders   DG Finger Thumb Right (Completed)   DG Hand Complete Right (Completed)   Arthritis of carpometacarpal (CMC) joint of right thumb       Relevant Medications   naproxen (NAPROSYN) 500 MG tablet   Other Relevant Orders   DG Finger Thumb Right (Completed)   DG Hand Complete Right (Completed)      Clinically most consistent with R Thumb CMC degenerative arthritis in situation of R hand dominant, likely some chronic overuse repetitive tasks. Concern for possible additional problem with tendonitis vs DeQuervain's Tenosynovitis, seems  to be chronic recurrent issue w/ flare up. - No  inciting injury - Other diff dx: still possible carpal tunnel syndrome, given some neurological associated symptoms and episodic numbness/tingling but prior history seems less suggestive given NCS and imaging done in past, however can still be problem now - No dedicated thumb/hand x-ray on file - Limited conservative therapy  Plan X-rays today R Thumb and R Hand today - reviewed results after visit and called patient / released results - showed confirmed mild R Thumb CMC DJD, without other abnormality  1. Start anti-inflammatory with Naproxen 500mg  BID wc for 1-2 weeks then PRN, reviewed risks and counseling with NSAIDs, without contraindication today 2. May take Tylenol PRN breakthrough 3. Offered Thumb Spica Wrist Splint (OTC) for support and avoid repetitive strain, allow tendons to heal. However she declined, this has caused her pain in past due to compression 4. Activity modification and relative rest 5. Use topical heat PRN 6. Follow-up if not improving 3-4 weeks - may change NSAID to topical diclofenac if needed or may benefit. Also discussion on next plan with Referral to [facility] locally Dr Verita Lamb for hand/wrist specialist 2nd opinion if not improving given chronic nature of this problem, I advised most likely would benefit from localized [facility] joint injection, possibly treatment for DeQuervain as well, and may need dedicated hand/wrist PT regimen, future  Consider advanced imaging or other options  She will contact us within 2-4 weeks if/when ready for Ortho Referral or if needs follow-up. Future we can consider neuro and NCS if need for carpal tunnel as possibility.  Meds ordered this encounter  Medications  . naproxen (NAPROSYN) 500 MG tablet    Sig: Take 1 tablet (500 mg total) by mouth 2 (two) times daily with a meal. For 1-2 weeks then as needed    Dispense:  60 tablet    Refill:  1    Follow up plan: Return in about 4 weeks (around 11/18/2018), or if  symptoms worsen or fail to improve, for R hand / thumb.  Nobie Putnam, DO [facility] 10/21/2018, 1:32 PM

## 2018-10-21 NOTE — Patient Instructions (Addendum)
Thank you for coming to the office today.  X-ray today for R thumb and hand, will send you results on MyChart once available  [facility] Address: [address], [place], [place] [address] Hours:  9AM-5PM Phone: [phone]  Will plan on referring to Dr Nancy Fetter. [place] - she is hand wrist specialist locally  Recommend trial of Anti-inflammatory with Naproxen (Naprosyn) 500mg  tabs - take one with food and plenty of water TWICE daily every day (breakfast and dinner), for next 1 to 2 weeks, then you may take only as needed - DO NOT TAKE any ibuprofen, aleve, motrin while you are taking this medicine - It is safe to take Tylenol Ext Str 500mg  tabs - take 1 to 2 (max dose 1000mg ) every 6 hours as needed for breakthrough pain, max 24 hour daily dose is 6 to 8 tablets or 4000mg    Please schedule a Follow-up Appointment to: Return in about 4 weeks (around 11/18/2018), or if symptoms worsen or fail to improve, for R hand / thumb.  If you have any other questions or concerns, please feel free to call the office or send a message through [facility]. You may also schedule an earlier appointment if necessary.  Additionally, you may be receiving a survey about your experience at our office within a few days to 1 week by e-mail or mail. We value your feedback.  Nobie Putnam, DO [place]

## 2018-10-23 ENCOUNTER — Ambulatory Visit
Admission: RE | Admit: 2018-10-23 | Discharge: 2018-10-23 | Disposition: A | Discharge status: Home / Self Care | Payer: Managed Care, Other (non HMO) | Source: Ambulatory Visit | Attending: Obstetrics and Gynecology | Admitting: Obstetrics and Gynecology

## 2018-10-23 DIAGNOSIS — R921 Mammographic calcification found on diagnostic imaging of breast: Secondary | ICD-10-CM

## 2018-10-23 DIAGNOSIS — N6310 Unspecified lump in the right breast, unspecified quadrant: Secondary | ICD-10-CM

## 2018-10-23 DIAGNOSIS — R928 Other abnormal and inconclusive findings on diagnostic imaging of breast: Secondary | ICD-10-CM

## 2018-11-18 DIAGNOSIS — M1811 Unilateral primary osteoarthritis of first carpometacarpal joint, right hand: Secondary | ICD-10-CM

## 2018-11-21 MED ORDER — DICLOFENAC SODIUM 1 % TD GEL: 2.0000 g | Freq: Three times a day (TID) | TRANSDERMAL | 2 refills | Status: DC | PRN

## 2018-11-21 NOTE — Telephone Encounter (Signed)
.  mychart

## 2018-11-21 NOTE — Addendum Note (Signed)
Addended by: Olin Hauser on: 11/21/2018 12:28 PM   Modules accepted: Orders

## 2018-12-05 ENCOUNTER — Other Ambulatory Visit: Payer: Self-pay | Admitting: Family Medicine

## 2018-12-05 DIAGNOSIS — K219 Gastro-esophageal reflux disease without esophagitis: Secondary | ICD-10-CM

## 2018-12-25 ENCOUNTER — Other Ambulatory Visit: Payer: Self-pay | Admitting: Family Medicine

## 2018-12-25 DIAGNOSIS — I10 Essential (primary) hypertension: Secondary | ICD-10-CM

## 2019-01-03 ENCOUNTER — Other Ambulatory Visit: Payer: Self-pay

## 2019-01-03 ENCOUNTER — Ambulatory Visit
Admission: EM | Admit: 2019-01-03 | Discharge: 2019-01-03 | Disposition: A | Discharge status: Home / Self Care | Payer: BLUE CROSS/BLUE SHIELD | Attending: Emergency Medicine | Admitting: Emergency Medicine

## 2019-01-03 DIAGNOSIS — J101 Influenza due to other identified influenza virus with other respiratory manifestations: Secondary | ICD-10-CM

## 2019-01-03 LAB — RAPID INFLUENZA A&B ANTIGENS
Influenza A (ARMC): POSITIVE — AB
Influenza B (ARMC): NEGATIVE

## 2019-01-03 LAB — RAPID STREP SCREEN (MED CTR MEBANE ONLY): STREPTOCOCCUS, GROUP A SCREEN (DIRECT): NEGATIVE

## 2019-01-03 MED ORDER — FLUTICASONE PROPIONATE 50 MCG/ACT NA SUSP: 2.0000 | Freq: Every day | NASAL | 0 refills | Status: DC

## 2019-01-03 MED ORDER — OSELTAMIVIR PHOSPHATE 75 MG PO CAPS: 75.0000 mg | ORAL_CAPSULE | Freq: Two times a day (BID) | ORAL | 0 refills | Status: DC

## 2019-01-03 MED ORDER — IBUPROFEN 600 MG PO TABS: 600.0000 mg | ORAL_TABLET | Freq: Four times a day (QID) | ORAL | 0 refills | Status: DC | PRN

## 2019-01-03 NOTE — ED Provider Notes (Addendum)
HPI  SUBJECTIVE:  Sheryl Carr is a 48 y.o. female who presents with the acute onset of body aches, headaches, nasal congestion, rhinorrhea, postnasal drip, sore throat, loose cough productive of the same material as her nasal congestion starting 2 to 3 days ago.  She denies fevers above 100.4.  She states that her daughter was sick with similar symptoms last week but did not seek medical attention.  She reports loose stools. denies photophobia, neck stiffness, wheezing, chest pain, shortness of breath, abdominal pain.  No vomiting, rash.  No sensation of her throat swelling shut, difficulty breathing, drooling, trismus.  She has been taking a thousand milligrams of Tylenol daily 8 hours and NyQuil.  Tylenol helps.  Last dose was within 6 hours of evaluation.  No aggravating factors.  She got a flu shot this year. she also reports 4 days of increased tearing and foreign body sensation in her left eye.  She reports some redness along the medial aspect of her eye.  No eye pain, visual changes, facial rash, left ear pain, proceeding paresthesias.  States that she has been dabbing at her eye frequently.  Past medical history negative for asthma, emphysema, COPD, smoking, diabetes, shingles, chickenpox.  She has a history of hypertension.  LMP: Status post hysterectomy.  ZDG:LOVFIEPPIRJ, [name], DO     Past Medical History:  Diagnosis Date  . Anemia   . Chronic kidney disease    H/O KIDNEY STONES  . Endometrial polyp 2016   hysteroscopically removed  . GERD (gastroesophageal reflux disease)   . H/O mammogram 05/11/2015  . Hematuria   . History of kidney stones   . History of uterine fibroid    s/p hysterectomy 08/2017  . HTN (hypertension)    PT WAS TAKEN OFF HER BP MED IN MAY/JUNE OF 2016 BUT BP IS ELEVATED AGAIN    Past Surgical History:  Procedure Laterality Date  . ABDOMINAL HYSTERECTOMY    . CESAREAN SECTION    . HYSTERECTOMY ABDOMINAL WITH SALPINGECTOMY Bilateral 08/19/2017     Procedure: HYSTERECTOMY ABDOMINAL WITH BILATERAL SALPINGECTOMY;  Surgeon: [name], MD;  Location: [facility] ORS;  Service: Gynecology;  Laterality: Bilateral;  . HYSTEROSCOPY W/D&C N/A 11/14/2015   Procedure: DILATATION AND CURETTAGE /HYSTEROSCOPY;  Surgeon: [name], MD;  Location: [facility] ORS;  Service: Gynecology;  Laterality: N/A;  . KIDNEY STONE SURGERY      Family History  Problem Relation Age of Onset  . Diabetes Mellitus I Mother   . Diabetes Mellitus I Maternal Grandmother   . Prostate cancer Maternal Grandfather   . Diabetes Mellitus I Paternal Grandmother   . Breast cancer Neg Hx     Social History   Tobacco Use  . Smoking status: Former Smoker    Last attempt to quit: 09/24/1990    Years since quitting: 28.2  . Smokeless tobacco: Former Network engineer Use Topics  . Alcohol use: Yes    Comment: social  . Drug use: No    No current facility-administered medications for this encounter.   Current Outpatient Medications:  .  diclofenac sodium (VOLTAREN) 1 % GEL, Apply 2 g topically 3 (three) times daily as needed (thumb/hand pain arthritis)., Disp: 100 g, Rfl: 2 .  econazole nitrate 1 % cream, Apply once a day as needed for rash., Disp: , Rfl: 2 .  fluticasone (FLONASE) 50 MCG/ACT nasal spray, Place 2 sprays into both nostrils daily., Disp: 16 g, Rfl: 0 .  ibuprofen (ADVIL,MOTRIN) 600 MG tablet, Take 1  tablet (600 mg total) by mouth every 6 (six) hours as needed., Disp: 30 tablet, Rfl: 0 .  losartan (COZAAR) 50 MG tablet, TAKE 1 TABLET(50 MG) BY MOUTH DAILY, Disp: 30 tablet, Rfl: 5 .  Multiple Vitamins-Calcium (ONE-A-DAY WOMENS FORMULA PO), Take 1 tablet by mouth daily. , Disp: , Rfl:  .  omeprazole (PRILOSEC) 20 MG capsule, TAKE 1 CAPSULE BY MOUTH TWICE DAILY BEFORE A MEAL, Disp: 60 capsule, Rfl: 11 .  oseltamivir (TAMIFLU) 75 MG capsule, Take 1 capsule (75 mg total) by mouth 2 (two) times daily. X 5 days, Disp: 10 capsule, Rfl: 0  Allergies  Allergen Reactions   . Pollen Extract     itchy eyes, dull headaches, runny nose  . Penicillins Rash    Has patient had a PCN reaction causing immediate rash, facial/tongue/throat swelling, SOB or lightheadedness with hypotension: No Has patient had a PCN reaction causing severe rash involving mucus membranes or skin necrosis: No Has patient had a PCN reaction that required hospitalization: No Has patient had a PCN reaction occurring within the last 10 years: No If all of the above answers are "NO", then may proceed with Cephalosporin use.      ROS  As noted in HPI.   Physical Exam  BP (!) 124/99 (BP Location: Right Arm)   Pulse 80   Temp 98.5 F (36.9 C) (Oral)   Resp 16   Ht 5\' 9"  (1.753 m)   Wt 69.4 kg   LMP 08/09/2017 (Exact Date)   SpO2 100%   BMI 22.59 kg/m   Constitutional: Well developed, well nourished, no acute distress Eyes: PERRL, EOMI, conjunctiva normal bilaterally.  No discharge.  No photophobia.  Mild nontender erythematous area at the medial corner of her left eye and mild infraorbital swelling.  No blisters.  Fluorescein exam negative for dendrites, abrasion.    HENT: Normocephalic, atraumatic,mucus membranes moist.  Positive nasal congestion.  No sinus tenderness.  Left external ear canal normal.  erythematous oropharynx, normal tonsils.  Uvula midline.  No obvious cobblestoning.  Positive postnasal drip. Neck: No cervical lymphadenopathy, meningismus. Respiratory: Clear to auscultation bilaterally, no rales, no wheezing, no rhonchi Cardiovascular: Normal rate and rhythm, no murmurs, no gallops, no rubs GI: Soft, nondistended, normal bowel sounds, nontender, no rebound, no guarding Back: no CVAT skin: No rash, skin intact Musculoskeletal: No edema, no tenderness, no deformities Neurologic: Alert & oriented x 3, CN II-XII grossly intact, no motor deficits, sensation grossly intact Psychiatric: Speech and behavior appropriate   ED Course   Medications - No data to  display  Orders Placed This Encounter  Procedures  . Rapid Influenza A&B Antigens ([facility] only)    Standing Status:   Standing    Number of Occurrences:   1  . Rapid Strep Screen ([facility] ONLY)    Standing Status:   Standing    Number of Occurrences:   1  . Culture, group A strep    Standing Status:   Standing    Number of Occurrences:   1  . Droplet precaution    Standing Status:   Standing    Number of Occurrences:   1   Results for orders placed or performed during the hospital encounter of 01/03/19 (from the past 24 hour(s))  Rapid Influenza A&B Antigens ([place] only)     Status: Abnormal   Collection Time: 01/03/19  8:53 AM  Result Value Ref Range   Influenza A ([facility]) POSITIVE (A) NEGATIVE   Influenza B (  [facility]) NEGATIVE NEGATIVE  Rapid Strep Screen ([facility] ONLY)     Status: None   Collection Time: 01/03/19  8:53 AM  Result Value Ref Range   Streptococcus, Group A Screen ([facility]) NEGATIVE NEGATIVE   No results found.  ED Clinical Impression  Influenza A   ED Assessment/Plan  1.  Patient flu a positive.  Tamiflu, Flonase, ibuprofen 600 mg combined with 1 g of Tylenol 3 or 4 times a day as needed.  Saline nasal irrigation with a Milta Deiters med rinse and distilled water as often as she wants to.  She declined a prescription for cough medicine.  2.  Increased tearing/dry skin/erythematous rash corner of left eye: It does not appear to be shingles at this point in time although this is in the differential.  She does not have a corneal abrasion.  Suspect dry, irritated skin from her dabbing her eye.  Bacitracin to this area.  Continue cool compresses.  Discussed signs and symptoms that should take her to the ER or to an ophthalmologist.    Discussed labs,  MDM, treatment plan, and plan for follow-up with patient Discussed sn/sx that should prompt return to the ED. patient agrees with plan.   Meds ordered this encounter  Medications  . fluticasone (FLONASE) 50 MCG/ACT  nasal spray    Sig: Place 2 sprays into both nostrils daily.    Dispense:  16 g    Refill:  0  . ibuprofen (ADVIL,MOTRIN) 600 MG tablet    Sig: Take 1 tablet (600 mg total) by mouth every 6 (six) hours as needed.    Dispense:  30 tablet    Refill:  0  . oseltamivir (TAMIFLU) 75 MG capsule    Sig: Take 1 capsule (75 mg total) by mouth 2 (two) times daily. X 5 days    Dispense:  10 capsule    Refill:  0    *This clinic note was created using [employer]. Therefore, there may be occasional mistakes despite careful proofreading.  ?   Melynda Ripple, MD 01/04/19 1210    Melynda Ripple, MD 01/04/19 [phone]

## 2019-01-03 NOTE — Discharge Instructions (Addendum)
Take 600 mg of ibuprofen combined with 1 g of Tylenol together 3 or 4 times a day as needed for pain.  Fluids.  Rest.  Finish the Tamiflu, even if you feel better.  Start some saline nasal irrigation with a Milta Deiters med rinse and distilled water as often as you want to help with the nasal congestion help prevent a bacterial sinus infection.  Continue cool compresses on your eye.  You can try a little bacitracin on that area near the corner of your eye.  Go immediately to an ophthalmologist or to the ER for any new signs and symptoms we discussed.

## 2019-01-03 NOTE — ED Triage Notes (Signed)
Runny nose and feeling bad starting on Wednesday. Bodyaches, left eye tearing, headache, mild cough, sore throat.

## 2019-01-06 ENCOUNTER — Other Ambulatory Visit: Payer: Self-pay

## 2019-01-06 DIAGNOSIS — R310 Gross hematuria: Secondary | ICD-10-CM

## 2019-01-06 DIAGNOSIS — N3001 Acute cystitis with hematuria: Secondary | ICD-10-CM

## 2019-01-06 LAB — CULTURE, GROUP A STREP (THRC)

## 2019-01-07 ENCOUNTER — Encounter: Payer: Self-pay | Admitting: Urology

## 2019-01-07 ENCOUNTER — Other Ambulatory Visit: Payer: Managed Care, Other (non HMO)

## 2019-01-20 DIAGNOSIS — L738 Other specified follicular disorders: Secondary | ICD-10-CM | POA: Diagnosis not present

## 2019-01-20 DIAGNOSIS — D229 Melanocytic nevi, unspecified: Secondary | ICD-10-CM | POA: Diagnosis not present

## 2019-01-20 DIAGNOSIS — L578 Other skin changes due to chronic exposure to nonionizing radiation: Secondary | ICD-10-CM | POA: Diagnosis not present

## 2019-01-20 DIAGNOSIS — Z1283 Encounter for screening for malignant neoplasm of skin: Secondary | ICD-10-CM | POA: Diagnosis not present

## 2019-03-13 ENCOUNTER — Other Ambulatory Visit: Payer: Self-pay

## 2019-03-13 ENCOUNTER — Ambulatory Visit (INDEPENDENT_AMBULATORY_CARE_PROVIDER_SITE_OTHER): Payer: BLUE CROSS/BLUE SHIELD

## 2019-03-13 ENCOUNTER — Ambulatory Visit
Admission: EM | Admit: 2019-03-13 | Discharge: 2019-03-13 | Disposition: A | Discharge status: Home / Self Care | Payer: BLUE CROSS/BLUE SHIELD | Attending: Family Medicine | Admitting: Family Medicine

## 2019-03-13 DIAGNOSIS — M25572 Pain in left ankle and joints of left foot: Secondary | ICD-10-CM | POA: Diagnosis not present

## 2019-03-13 DIAGNOSIS — Y93H2 Activity, gardening and landscaping: Secondary | ICD-10-CM | POA: Diagnosis not present

## 2019-03-13 DIAGNOSIS — M7989 Other specified soft tissue disorders: Secondary | ICD-10-CM | POA: Diagnosis not present

## 2019-03-13 DIAGNOSIS — S82832A Other fracture of upper and lower end of left fibula, initial encounter for closed fracture: Secondary | ICD-10-CM | POA: Diagnosis not present

## 2019-03-13 DIAGNOSIS — M79672 Pain in left foot: Secondary | ICD-10-CM

## 2019-03-13 NOTE — Discharge Instructions (Signed)
-  Remain in CAM Walker boot until follow-up with Orthopaedic provider. -Ice and elevate the ankle as much as possible over the weekend. -Call on Monday to schedule follow-up with Orthopaedics. -Tylenol or Ibuprofen as needed for pain at home.

## 2019-03-13 NOTE — ED Triage Notes (Signed)
Pt was outside moving about 2 hours ago and fell and heard a pop noise in her left foot. Does have pain and tingling on the underside of her left foot and left ankle is swollen

## 2019-03-13 NOTE — ED Provider Notes (Signed)
[facility] CSN: [id number] Arrival date & time: 03/13/19  1853  History   Chief Complaint Chief Complaint  Patient presents with  . Fall    HPI Sheryl Carr is a 48 y.o. female who presents today for evaluation of the left ankle injury.  The patient states that 2 hours ago she was mowing her yard, she was turning the lawnmower when she suffered a inversion injury to the left ankle.  Denies any pain prior to the injury.  The patient felt and heard a pop in the left ankle.  Since then she has had increased discomfort when attempting to place weight onto her left foot.  She reports pain primarily along the lateral aspect of the ankle, at most minimal discomfort along the medial aspect of the ankle on occasion.  Denies any numbness or tingling the left foot.  HPI  Past Medical History:  Diagnosis Date  . Anemia   . Chronic kidney disease    H/O KIDNEY STONES  . Endometrial polyp 2016   hysteroscopically removed  . GERD (gastroesophageal reflux disease)   . H/O mammogram 05/11/2015  . Hematuria   . History of kidney stones   . History of uterine fibroid    s/p hysterectomy 08/2017  . HTN (hypertension)    PT WAS TAKEN OFF HER BP MED IN MAY/JUNE OF 2016 BUT BP IS ELEVATED AGAIN    Patient Active Problem List   Diagnosis Date Noted  . Gross hematuria 07/02/2017  . History of nephrolithiasis 07/02/2017  . Anemia 11/12/2016  . Essential hypertension 11/10/2015  . GERD (gastroesophageal reflux disease) 08/03/2015  . Abdominal pain 05/23/2015  . Anxiety 05/23/2015  . Allergic contact dermatitis 05/23/2015  . Elevated LDL cholesterol level 05/23/2015  . Fatigue 05/23/2015  . Brash 05/23/2015  . Breast lump 05/23/2015  . Muscle spasms of neck 05/23/2015  . Neuropathy 05/23/2015  . Pharyngeal inflammation 05/23/2015  . Skin lesion 05/23/2015  . Pain in limb 09/24/2013  . Numbness 09/24/2013    Past Surgical History:  Procedure Laterality Date  . ABDOMINAL  HYSTERECTOMY    . CESAREAN SECTION    . HYSTERECTOMY ABDOMINAL WITH SALPINGECTOMY Bilateral 08/19/2017   Procedure: HYSTERECTOMY ABDOMINAL WITH BILATERAL SALPINGECTOMY;  Surgeon: [name], MD;  Location: [facility] ORS;  Service: Gynecology;  Laterality: Bilateral;  . HYSTEROSCOPY W/D&C N/A 11/14/2015   Procedure: DILATATION AND CURETTAGE /HYSTEROSCOPY;  Surgeon: [name], MD;  Location: [facility] ORS;  Service: Gynecology;  Laterality: N/A;  . KIDNEY STONE SURGERY      OB History    Gravida  2   Para  2   Term  2   Preterm      AB      Living  2     SAB      TAB      Ectopic      Multiple      Live Births  2            Home Medications    Prior to Admission medications   Medication Sig Start Date End Date Taking? Authorizing Provider  losartan (COZAAR) 50 MG tablet TAKE 1 TABLET(50 MG) BY MOUTH DAILY 12/25/18  Yes Karamalegos, [name], DO  omeprazole (PRILOSEC) 20 MG capsule TAKE 1 CAPSULE BY MOUTH TWICE DAILY BEFORE A MEAL 12/05/18  Yes Karamalegos, [name], DO  diclofenac sodium (VOLTAREN) 1 % GEL Apply 2 g topically 3 (three) times daily as needed (thumb/hand pain arthritis). 11/21/18  Karamalegos, Alexander J, DO  econazole nitrate 1 % cream Apply once a day as needed for rash. 01/15/17   [provider]  fluticasone (FLONASE) 50 MCG/ACT nasal spray Place 2 sprays into both nostrils daily. 01/03/19   Melynda Ripple, MD  ibuprofen (ADVIL,MOTRIN) 600 MG tablet Take 1 tablet (600 mg total) by mouth every 6 (six) hours as needed. 01/03/19   Melynda Ripple, MD  Multiple Vitamins-Calcium (ONE-A-DAY WOMENS FORMULA PO) Take 1 tablet by mouth daily.     [provider]  oseltamivir (TAMIFLU) 75 MG capsule Take 1 capsule (75 mg total) by mouth 2 (two) times daily. X 5 days 01/03/19   Melynda Ripple, MD    Family History Family History  Problem Relation Age of Onset  . Diabetes Mellitus I Mother   . Diabetes Mellitus I Maternal Grandmother    . Prostate cancer Maternal Grandfather   . Diabetes Mellitus I Paternal Grandmother   . Breast cancer Neg Hx     Social History Social History   Tobacco Use  . Smoking status: Former Smoker    Last attempt to quit: 09/24/1990    Years since quitting: 28.4  . Smokeless tobacco: Former Network engineer Use Topics  . Alcohol use: Yes    Comment: social  . Drug use: No     Allergies   Pollen extract and Penicillins   Review of Systems Review of Systems  Musculoskeletal: Positive for gait problem and joint swelling.  All other systems reviewed and are negative.  Physical Exam Triage Vital Signs ED Triage Vitals  Enc Vitals Group     BP 03/13/19 1901 (!) 139/99     Pulse Rate 03/13/19 1901 79     Resp 03/13/19 1901 18     Temp 03/13/19 1901 98.6 F (37 C)     Temp Source 03/13/19 1901 Oral     SpO2 03/13/19 1901 100 %     Weight 03/13/19 1903 155 lb (70.3 kg)     Height 03/13/19 1903 5\' 9"  (1.753 m)     Head Circumference --      Peak Flow --      Pain Score 03/13/19 1903 2     Pain Loc --      Pain Edu? --      Excl. in [place]? --    No data found.  Updated Vital Signs BP (!) 139/99 (BP Location: Left Arm)   Pulse 79   Temp 98.6 F (37 C) (Oral)   Resp 18   Ht 5\' 9"  (1.753 m)   Wt 155 lb (70.3 kg)   LMP 08/09/2017 (Exact Date)   SpO2 100%   BMI 22.89 kg/m   Visual Acuity Right Eye Distance:   Left Eye Distance:   Bilateral Distance:    Right Eye Near:   Left Eye Near:    Bilateral Near:     Physical Exam Patient is in a wheelchair when I entered the room.  Skin examination of the left ankle does demonstrate moderate swelling along the lateral aspect of the ankle.  No erythema or ecchymosis.  No swelling on the medial aspect.  Patient does report mild tenderness with palpation along the posterior medial malleolus, no tenderness with palpation over the deltoid ligament.  Moderate tenderness with palpation over the distal fibula.  The patient is able to  dorsiflex and plantar flex the left foot with discomfort, inversion and eversion of the left ankle was not evaluated.  She is able  to flex and extend all of her toes.  She is intact light touch throughout the left lower extremity.  UC Treatments / Results  Labs (all labs ordered are listed, but only abnormal results are displayed) Labs Reviewed - No data to display  EKG None  Radiology Dg Ankle Complete Left  Result Date: 03/13/2019 CLINICAL DATA:  Pain after fall. EXAM: LEFT ANKLE COMPLETE - 3+ VIEW COMPARISON:  None FINDINGS: Mild lateral soft tissue swelling. No fractures. The ankle mortise is intact. No other acute abnormalities. IMPRESSION: Negative. Electronically Signed   By: Dorise Bullion III M.D   On: 03/13/2019 19:24   Dg Foot Complete Left  Result Date: 03/13/2019 CLINICAL DATA:  Pain after injury EXAM: LEFT FOOT - COMPLETE 3+ VIEW COMPARISON:  None. FINDINGS: There is no evidence of fracture or dislocation. There is no evidence of arthropathy or other focal bone abnormality. Soft tissues are unremarkable. IMPRESSION: Negative. Electronically Signed   By: Dorise Bullion III M.D   On: 03/13/2019 19:25    Procedures Procedures (including critical care time)  Medications Ordered in UC Medications - No data to display  Initial Impression / Assessment and Plan / UC Course  I have reviewed the triage vital signs and the nursing notes.  Pertinent labs & imaging results that were available during my care of the patient were reviewed by me and considered in my medical decision making (see chart for details).     1.  Treatment options were discussed today with the patient. 2.  Even though the official report did not state any fractures, upon my review I believe there is a subtle, nondisplaced oblique fracture of the left distal fibula.  Patient is tender over the area of the questionable fracture. 3.  The patient was offered and received a cam walker boot.  Instructed to minimize  weightbearing to left lower extremity.  Instructed to obtain crutches over-the-counter. 4.  She was instructed to ice and elevate the ankle as much as possible over the weekend. 5.  Will contact orthopedics for follow-up appointment next week. Final Clinical Impressions(s) / UC Diagnoses   Final diagnoses:  Other closed fracture of distal end of left fibula, initial encounter     Discharge Instructions     -Remain in CAM Walker boot until follow-up with Orthopaedic provider. -Ice and elevate the ankle as much as possible over the weekend. -Call on Monday to schedule follow-up with Orthopaedics. -Tylenol or Ibuprofen as needed for pain at home.   ED Prescriptions    None     Controlled Substance Prescriptions [place] Controlled Substance Registry consulted? Not Applicable   Lattie Corns, PA-C 03/13/19 2009

## 2019-03-17 DIAGNOSIS — S8265XA Nondisplaced fracture of lateral malleolus of left fibula, initial encounter for closed fracture: Secondary | ICD-10-CM | POA: Diagnosis not present

## 2019-03-17 DIAGNOSIS — S93402A Sprain of unspecified ligament of left ankle, initial encounter: Secondary | ICD-10-CM | POA: Diagnosis not present

## 2019-03-30 DIAGNOSIS — S93402A Sprain of unspecified ligament of left ankle, initial encounter: Secondary | ICD-10-CM | POA: Diagnosis not present

## 2019-03-30 DIAGNOSIS — S8265XA Nondisplaced fracture of lateral malleolus of left fibula, initial encounter for closed fracture: Secondary | ICD-10-CM | POA: Diagnosis not present

## 2019-05-01 ENCOUNTER — Telehealth: Payer: Self-pay | Admitting: Obstetrics and Gynecology

## 2019-05-01 DIAGNOSIS — R928 Other abnormal and inconclusive findings on diagnostic imaging of breast: Secondary | ICD-10-CM

## 2019-05-01 NOTE — Telephone Encounter (Signed)
Patient called requesting an order be placed for her to get 6 month follow up mammogram.

## 2019-05-05 NOTE — Telephone Encounter (Signed)
Notified patient that order has been placed.

## 2019-05-05 NOTE — Telephone Encounter (Signed)
I have placed the order.   Dr. Marcelline Mates

## 2019-05-05 NOTE — Telephone Encounter (Signed)
Do you want just mammogram?

## 2019-05-08 ENCOUNTER — Other Ambulatory Visit: Payer: Self-pay | Admitting: Obstetrics and Gynecology

## 2019-05-08 DIAGNOSIS — R921 Mammographic calcification found on diagnostic imaging of breast: Secondary | ICD-10-CM

## 2019-05-27 ENCOUNTER — Other Ambulatory Visit: Payer: Self-pay

## 2019-05-27 ENCOUNTER — Ambulatory Visit
Admission: RE | Admit: 2019-05-27 | Discharge: 2019-05-27 | Disposition: A | Discharge status: Home / Self Care | Payer: BC Managed Care – PPO | Source: Ambulatory Visit | Attending: Obstetrics and Gynecology | Admitting: Obstetrics and Gynecology

## 2019-05-27 DIAGNOSIS — R922 Inconclusive mammogram: Secondary | ICD-10-CM | POA: Diagnosis not present

## 2019-05-27 DIAGNOSIS — R921 Mammographic calcification found on diagnostic imaging of breast: Secondary | ICD-10-CM | POA: Diagnosis not present

## 2019-06-28 ENCOUNTER — Other Ambulatory Visit: Payer: Self-pay | Admitting: Family Medicine

## 2019-06-28 DIAGNOSIS — I10 Essential (primary) hypertension: Secondary | ICD-10-CM

## 2019-09-03 ENCOUNTER — Ambulatory Visit (INDEPENDENT_AMBULATORY_CARE_PROVIDER_SITE_OTHER): Payer: BLUE CROSS/BLUE SHIELD | Admitting: Family Medicine

## 2019-09-03 ENCOUNTER — Encounter: Payer: Self-pay | Admitting: Family Medicine

## 2019-09-03 ENCOUNTER — Other Ambulatory Visit: Payer: Self-pay | Admitting: Family Medicine

## 2019-09-03 ENCOUNTER — Other Ambulatory Visit: Payer: Self-pay

## 2019-09-03 VITALS — BP 132/85 | HR 74 | Temp 98.4°F | Resp 16 | Ht 68.0 in | Wt 155.6 lb

## 2019-09-03 DIAGNOSIS — K529 Noninfective gastroenteritis and colitis, unspecified: Secondary | ICD-10-CM | POA: Insufficient documentation

## 2019-09-03 DIAGNOSIS — R195 Other fecal abnormalities: Secondary | ICD-10-CM

## 2019-09-03 DIAGNOSIS — Z23 Encounter for immunization: Secondary | ICD-10-CM

## 2019-09-03 DIAGNOSIS — Z0000 Encounter for general adult medical examination without abnormal findings: Secondary | ICD-10-CM

## 2019-09-03 DIAGNOSIS — R7309 Other abnormal glucose: Secondary | ICD-10-CM

## 2019-09-03 DIAGNOSIS — I10 Essential (primary) hypertension: Secondary | ICD-10-CM

## 2019-09-03 DIAGNOSIS — D500 Iron deficiency anemia secondary to blood loss (chronic): Secondary | ICD-10-CM

## 2019-09-03 DIAGNOSIS — E7800 Pure hypercholesterolemia, unspecified: Secondary | ICD-10-CM

## 2019-09-03 NOTE — Patient Instructions (Addendum)
Thank you for coming to the office today.  Referral sent today. Stay tuned for apt.  [facility] for stool tests and Celiac blood test. GI can review results further.  Flu Shot today.  DUE for FASTING BLOOD WORK (no food or drink after midnight before the lab appointment, only water or coffee without cream/sugar on the morning of)  SCHEDULE "Lab Only" visit in the morning at the clinic for lab draw in 2-3 MONTHS   - Make sure Lab Only appointment is at about 1 week before your next appointment, so that results will be available  For Lab Results, once available within 2-3 days of blood draw, you can can log in to MyChart online to view your results and a brief explanation. Also, we can discuss results at next follow-up visit.   Please schedule a Follow-up Appointment to: Return in about 2 months (around 11/03/2019) for Annual Physical.  If you have any other questions or concerns, please feel free to call the office or send a message through [place]. You may also schedule an earlier appointment if necessary.  Additionally, you may be receiving a survey about your experience at our office within a few days to 1 week by e-mail or mail. We value your feedback.  Nobie Putnam, DO [place]

## 2019-09-03 NOTE — Progress Notes (Signed)
Subjective:    Patient ID: Sheryl Carr, female    DOB: [birth year], 48 y.o.   MRN: [id number]  Sheryl Carr is a 48 y.o. female presenting on 09/03/2019 for Diarrhea (needs referral for GI)   HPI   Chronic Diarrhea / Loose Stools - Last visit with me 03/13/18, for initial visit for same problem, treated with recommendations for several OTC med trials and lifestyle intervention thought to be more functional GI diarrhea symptoms given duration 1-2 months, see prior notes for background information. - Interval update with no significant relief, tried probiotic for several months, antacid, other OTC remedy and still has problem - Today patient reports concerns with still having chronic diarrhea loose stools, unchanged over past 1.5 years now, describes as loose to watery BM 1-3 x daily, no longer has formed stools, does not endorse constipation, describes urgency with difficulty holding BM at times but without problem of incontinence, not necessarily triggered by eating or diet, unable to identify any dietary triggers or links, has tried gluten free temporarily but not adherent to that - Admits associated occasional nausea but not persistent, no significant vomiting - Admits some increased gas - She is interested in referral to GI - she called them and was told to get a referral from PCP - Never had colonoscopy Denies any abdominal pain or cramping, fever chills unintentional weight loss, blood in stool or dark stools  Follow-up [facility] next week, hand pain  Health Maintenance: Due for Flu Shot, will receive today    Depression screen [facility] 2/9 09/03/2019 10/21/2018 11/19/2017  Decreased Interest 0 0 0  Down, Depressed, Hopeless 0 0 0  PHQ - 2 Score 0 0 0    Social History   Tobacco Use  . Smoking status: Former Smoker    Quit date: 09/24/1990    Years since quitting: 28.9  . Smokeless tobacco: Former Network engineer Use Topics  . Alcohol use: Yes    Comment:  social  . Drug use: No    Review of Systems Per HPI unless specifically indicated above     Objective:    BP 132/85   Pulse 74   Temp 98.4 F (36.9 C) (Oral)   Resp 16   Ht '5\' 8"'  (1.727 m)   Wt 155 lb 9.6 oz (70.6 kg)   LMP 08/09/2017 (Exact Date)   BMI 23.66 kg/m   Wt Readings from Last 3 Encounters:  09/03/19 155 lb 9.6 oz (70.6 kg)  03/13/19 155 lb (70.3 kg)  01/03/19 153 lb (69.4 kg)    Physical Exam Vitals signs and nursing note reviewed.  Constitutional:      General: She is not in acute distress.    Appearance: She is well-developed. She is not diaphoretic.     Comments: Well-appearing, comfortable, cooperative  HENT:     Head: Normocephalic and atraumatic.  Eyes:     General:        Right eye: No discharge.        Left eye: No discharge.     Conjunctiva/sclera: Conjunctivae normal.  Cardiovascular:     Rate and Rhythm: Normal rate.  Pulmonary:     Effort: Pulmonary effort is normal.  Abdominal:     General: Bowel sounds are normal. There is no distension.     Palpations: Abdomen is soft.     Tenderness: There is no abdominal tenderness.  Skin:    General: Skin is warm and dry.     Findings:  No erythema or rash.  Neurological:     Mental Status: She is alert and oriented to person, place, and time.  Psychiatric:        Behavior: Behavior normal.     Comments: Well groomed, good eye contact, normal speech and thoughts    Results for orders placed or performed during the hospital encounter of 01/03/19  Rapid Influenza A&B Antigens ([facility] only)   Specimen: Flu Kit Nasopharyngeal Swab; Respiratory  Result Value Ref Range   Influenza A ([facility]) POSITIVE (A) NEGATIVE   Influenza B ([facility]) NEGATIVE NEGATIVE  Rapid Strep Screen ([facility] ONLY)   Specimen: Other  Result Value Ref Range   Streptococcus, Group A Screen (Direct) NEGATIVE NEGATIVE  Culture, group A strep   Specimen: Throat  Result Value Ref Range   Specimen Description      THROAT  Performed at [facility] [place], [address], [place], [place] [address]    Special Requests      NONE Reflexed from [id number] Performed at [facility], [address] [place], [place] [address]    Culture      NO GROUP A STREP (S.PYOGENES) ISOLATED Performed at [facility], [place] [address] [place], [place] [address]    Report Status 01/06/2019 FINAL       Assessment & Plan:   Problem List Items Addressed This Visit    Chronic diarrhea - Primary   Relevant Orders   GI Profile, Stool, PCR   Glia (IgA/G) + tTG IgA   Ambulatory referral to Gastroenterology    Other Visit Diagnoses    Watery stools       Relevant Orders   Clostridium difficile EIA   Ambulatory referral to Gastroenterology   Needs flu shot       Relevant Orders   Flu Vaccine QUAD 36+ mos IM (Completed)         Clinically suspected to be chronic functional GI problem with loose stools and other associated symptoms over past 1.5 years, without worse or improvement - Concern given chronicity seems less likely to be transient viral gastro or other temporary GI illness - Limited other systemic symptoms reassuring - Not consistent with significant acute infection - Possible IBS component with stress, no prior dx of IBS - No GI red flag symptoms today - Never had colonoscopy for screening or diagnostic, age 25 - Failed conservative therapy probiotics OTC remedy diet modifications, fiber  Plan Reviewed functional GI symptoms again Proceed with further eval - will order Stool panel - C Diff and GI Pathogen, Celiac lab test from [facility], then proceed to [facility] - requested Dr Bonna Gains for evaluation of chronic diarrhea  No orders of the defined types were placed in this encounter.  Orders Placed This Encounter  Procedures  . Clostridium difficile EIA  . Flu Vaccine QUAD 36+ mos IM  . GI Profile, Stool, PCR  . Glia (IgA/G) + tTG IgA  . Ambulatory referral to Gastroenterology     Referral Priority:   Routine    Referral Type:   Consultation    Referral Reason:   Specialty Services Required    Referred to Provider:   Virgel Manifold, MD    Number of Visits Requested:   1    Follow up plan: Return in about 2 months (around 11/03/2019) for Annual Physical.  Future labs ordered 10/15/19 - [facility] for physical  Nobie Putnam, [place]  [facility] 09/03/2019, 11:33 AM

## 2019-09-07 DIAGNOSIS — M1811 Unilateral primary osteoarthritis of first carpometacarpal joint, right hand: Secondary | ICD-10-CM | POA: Diagnosis not present

## 2019-09-08 DIAGNOSIS — K529 Noninfective gastroenteritis and colitis, unspecified: Secondary | ICD-10-CM | POA: Diagnosis not present

## 2019-09-12 LAB — GI PROFILE, STOOL, PCR

## 2019-09-12 LAB — CLOSTRIDIUM DIFFICILE EIA: C difficile Toxins A+B, EIA: NEGATIVE

## 2019-09-12 LAB — GLIA (IGA/G) + TTG IGA
Antigliadin Abs, IgA: 4 units (ref 0–19)
Gliadin IgG: 4 units (ref 0–19)
Transglutaminase IgA: <2 U/mL (ref 0–3)

## 2019-10-15 ENCOUNTER — Other Ambulatory Visit: Payer: BLUE CROSS/BLUE SHIELD

## 2019-10-15 DIAGNOSIS — E7800 Pure hypercholesterolemia, unspecified: Secondary | ICD-10-CM | POA: Diagnosis not present

## 2019-10-15 DIAGNOSIS — R7309 Other abnormal glucose: Secondary | ICD-10-CM

## 2019-10-15 DIAGNOSIS — I10 Essential (primary) hypertension: Secondary | ICD-10-CM | POA: Diagnosis not present

## 2019-10-15 DIAGNOSIS — Z0000 Encounter for general adult medical examination without abnormal findings: Secondary | ICD-10-CM | POA: Diagnosis not present

## 2019-10-15 DIAGNOSIS — D500 Iron deficiency anemia secondary to blood loss (chronic): Secondary | ICD-10-CM

## 2019-10-16 LAB — CBC WITH DIFFERENTIAL/PLATELET
Absolute Monocytes: 375 cells/uL (ref 200–950)
Basophils Absolute: 22 cells/uL (ref 0–200)
Basophils Relative: 0.4 %
Eosinophils Absolute: 123 cells/uL (ref 15–500)
Eosinophils Relative: 2.2 %
HCT: 41.6 % (ref 35.0–45.0)
Hemoglobin: 13.7 g/dL (ref 11.7–15.5)
Lymphs Abs: 1198 cells/uL (ref 850–3900)
MCH: 28.7 pg (ref 27.0–33.0)
MCHC: 32.9 g/dL (ref 32.0–36.0)
MCV: 87.2 fL (ref 80.0–100.0)
MPV: 10.5 fL (ref 7.5–12.5)
Monocytes Relative: 6.7 %
Neutro Abs: 3881 cells/uL (ref 1500–7800)
Neutrophils Relative %: 69.3 %
Platelets: 241 10*3/uL (ref 140–400)
RBC: 4.77 10*6/uL (ref 3.80–5.10)
RDW: 11.6 % (ref 11.0–15.0)
Total Lymphocyte: 21.4 %
WBC: 5.6 10*3/uL (ref 3.8–10.8)

## 2019-10-16 LAB — COMPLETE METABOLIC PANEL WITH GFR
AG Ratio: 1.7 (calc) (ref 1.0–2.5)
ALT: 17 U/L (ref 6–29)
AST: 11 U/L (ref 10–35)
Albumin: 4.3 g/dL (ref 3.6–5.1)
Alkaline phosphatase (APISO): 56 U/L (ref 31–125)
BUN: 19 mg/dL (ref 7–25)
CO2: 26 mmol/L (ref 20–32)
Calcium: 9.3 mg/dL (ref 8.6–10.2)
Chloride: 103 mmol/L (ref 98–110)
Creat: 0.74 mg/dL (ref 0.50–1.10)
GFR, Est African American: 112 mL/min/{1.73_m2} (ref >=60)
GFR, Est Non African American: 96 mL/min/{1.73_m2} (ref >=60)
Globulin: 2.6 g/dL (calc) (ref 1.9–3.7)
Glucose, Bld: 90 mg/dL (ref 65–99)
Potassium: 4.6 mmol/L (ref 3.5–5.3)
Sodium: 136 mmol/L (ref 135–146)
Total Bilirubin: 0.7 mg/dL (ref 0.2–1.2)
Total Protein: 6.9 g/dL (ref 6.1–8.1)

## 2019-10-16 LAB — LIPID PANEL
Cholesterol: 192 mg/dL (ref <200)
HDL: 53 mg/dL (ref >=50)
LDL Cholesterol (Calc): 122 mg/dL (calc) — ABNORMAL HIGH
Non-HDL Cholesterol (Calc): 139 mg/dL (calc) — ABNORMAL HIGH (ref <130)
Total CHOL/HDL Ratio: 3.6 (calc) (ref <5.0)
Triglycerides: 78 mg/dL (ref <150)

## 2019-10-16 LAB — HEMOGLOBIN A1C
Hgb A1c MFr Bld: 5 % of total Hgb (ref <5.7)
Mean Plasma Glucose: 97 (calc)
eAG (mmol/L): 5.4 (calc)

## 2019-10-16 LAB — TSH: TSH: 3.46 mIU/L

## 2019-10-19 ENCOUNTER — Other Ambulatory Visit: Payer: Self-pay

## 2019-10-20 ENCOUNTER — Ambulatory Visit (INDEPENDENT_AMBULATORY_CARE_PROVIDER_SITE_OTHER): Payer: BLUE CROSS/BLUE SHIELD | Admitting: Gastroenterology

## 2019-10-20 ENCOUNTER — Other Ambulatory Visit: Payer: Self-pay

## 2019-10-20 ENCOUNTER — Encounter: Payer: Self-pay | Admitting: Gastroenterology

## 2019-10-20 VITALS — BP 146/93 | HR 78 | Temp 98.4°F | Wt 159.0 lb

## 2019-10-20 DIAGNOSIS — R197 Diarrhea, unspecified: Secondary | ICD-10-CM | POA: Diagnosis not present

## 2019-10-20 DIAGNOSIS — Z1211 Encounter for screening for malignant neoplasm of colon: Secondary | ICD-10-CM | POA: Diagnosis not present

## 2019-10-20 MED ORDER — METAMUCIL SMOOTH TEXTURE 58.6 % PO POWD: 1.0000 | Freq: Every day | ORAL | 0 refills | Status: DC

## 2019-10-20 MED ORDER — BISACODYL EC 5 MG PO TBEC: DELAYED_RELEASE_TABLET | ORAL | 0 refills | Status: DC

## 2019-10-20 MED ORDER — NA SULFATE-K SULFATE-MG SULF 17.5-3.13-1.6 GM/177ML PO SOLN: 354.0000 mL | Freq: Once | ORAL | 0 refills | Status: AC

## 2019-10-20 NOTE — Progress Notes (Signed)
Sheryl Carr [address]  [place]  [place], [place] [address]  Main: [phone]  Fax: [phone]   Gastroenterology Consultation  Referring Provider:     Nobie Putnam * Primary Care Physician:  Olin Hauser, DO Reason for Consultation:    Diarrhea        HPI:    Chief Complaint  Patient presents with  . New Patient (Initial Visit)  . Diarrhea    Patient is having diarrhea 2-3 times a day     Sheryl Carr is a 48 y.o. y/o female referred for consultation & management  by Dr. Parks Ranger, [name], DO.  Patient reports ongoing loose stools since February 2019.  Had an episode of nausea vomiting and diarrhea around that time and the nausea and vomiting completely resolved.  However, the diarrhea continued.  Reports 2-3 loose bowel movements a day.  No blood in stool.  No weight loss.  No abdominal pain.  No further nausea or vomiting.  GI profile, C. difficile testing and TTG IgA and total IgA were all normal recently  No prior EGD or colonoscopy.  No family history of colon cancer.  Has been taking omeprazole for 3 years.  This was started for indigestion.  Now only has infrequent episodes of indigestion may be about once or twice a week.  No dysphagia.  Has not tried stopping the medication.   Past Medical History:  Diagnosis Date  . Anemia   . Chronic kidney disease    H/O KIDNEY STONES  . Endometrial polyp 2016   hysteroscopically removed  . GERD (gastroesophageal reflux disease)   . H/O mammogram 05/11/2015  . Hematuria   . History of kidney stones   . History of uterine fibroid    s/p hysterectomy 08/2017  . HTN (hypertension)    PT WAS TAKEN OFF HER BP MED IN MAY/JUNE OF 2016 BUT BP IS ELEVATED AGAIN    Past Surgical History:  Procedure Laterality Date  . ABDOMINAL HYSTERECTOMY    . CESAREAN SECTION    . HYSTERECTOMY ABDOMINAL WITH SALPINGECTOMY Bilateral 08/19/2017   Procedure: HYSTERECTOMY ABDOMINAL WITH  BILATERAL SALPINGECTOMY;  Surgeon: [name], MD;  Location: [facility];  Service: Gynecology;  Laterality: Bilateral;  . HYSTEROSCOPY W/D&C N/A 11/14/2015   Procedure: DILATATION AND CURETTAGE /HYSTEROSCOPY;  Surgeon: [name], MD;  Location: [facility];  Service: Gynecology;  Laterality: N/A;  . KIDNEY STONE SURGERY      Prior to Admission medications   Medication Sig Start Date End Date Taking? Authorizing Provider  econazole nitrate 1 % cream Apply once a day as needed for rash. 01/15/17  Yes [provider]  losartan (COZAAR) 50 MG tablet TAKE 1 TABLET(50 MG) BY MOUTH DAILY 06/28/19  Yes Karamalegos, [name], DO  Multiple Vitamins-Calcium (ONE-A-DAY WOMENS FORMULA PO) Take 1 tablet by mouth daily.    Yes [provider]  omeprazole (PRILOSEC) 20 MG capsule TAKE 1 CAPSULE BY MOUTH TWICE DAILY BEFORE A MEAL 12/05/18  Yes Karamalegos, [name], DO  bisacodyl (BISACODYL) 5 MG EC tablet Take 2 tablets (10mg ) by mouth the day before your procedure between 1pm-3pm. 10/20/19   Virgel Manifold, MD  Na Sulfate-K Sulfate-Mg Sulf 17.5-3.13-1.6 GM/177ML SOLN Take 354 mLs by mouth once for 1 dose. 10/20/19 10/20/19  Virgel Manifold, MD  psyllium (METAMUCIL SMOOTH TEXTURE) 58.6 % powder Take 1 packet by mouth daily. 10/20/19   Virgel Manifold, MD    Family History  Problem  Relation Age of Onset  . Diabetes Mellitus I Mother   . Diabetes Mellitus I Maternal Grandmother   . Prostate cancer Maternal Grandfather   . Diabetes Mellitus I Paternal Grandmother   . Breast cancer Neg Hx      Social History   Tobacco Use  . Smoking status: Former Smoker    Quit date: 09/24/1990    Years since quitting: 29.0  . Smokeless tobacco: Former Network engineer Use Topics  . Alcohol use: Yes    Alcohol/week: 2.0 - 3.0 standard drinks    Types: 2 - 3 Standard drinks or equivalent per week    Comment: social  . Drug use: No    Allergies as of 10/20/2019 - Review  Complete 10/20/2019  Allergen Reaction Noted  . Pollen extract  05/23/2015  . Penicillins Rash 05/23/2015    Review of Systems:    All systems reviewed and negative except where noted in HPI.   Physical Exam:  BP (!) 146/93 (BP Location: Left Arm, Patient Position: Sitting, Cuff Size: Normal)   Pulse 78   Temp 98.4 F (36.9 C) (Oral)   Wt 159 lb (72.1 kg)   LMP 08/09/2017 (Exact Date)   BMI 24.18 kg/m  Patient's last menstrual period was 08/09/2017 (exact date). Psych:  Alert and cooperative. Normal mood and affect. General:   Alert,  Well-developed, well-nourished, pleasant and cooperative in NAD Head:  Normocephalic and atraumatic. Eyes:  Sclera clear, no icterus.   Conjunctiva pink. Ears:  Normal auditory acuity. Nose:  No deformity, discharge, or lesions. Mouth:  No deformity or lesions,oropharynx pink & moist. Neck:  Supple; no masses or thyromegaly. Abdomen:  Normal bowel sounds.  No bruits.  Soft, non-tender and non-distended without masses, hepatosplenomegaly or hernias noted.  No guarding or rebound tenderness.    Msk:  Symmetrical without gross deformities. Good, equal movement & strength bilaterally. Pulses:  Normal pulses noted. Extremities:  No clubbing or edema.  No cyanosis. Neurologic:  Alert and oriented x3;  grossly normal neurologically. Skin:  Intact without significant lesions or rashes. No jaundice. Lymph Nodes:  No significant cervical adenopathy. Psych:  Alert and cooperative. Normal mood and affect.   Labs: CBC    Component Value Date/Time   WBC 5.6 10/15/2019 0855   RBC 4.77 10/15/2019 0855   HGB 13.7 10/15/2019 0855   HGB 8.3 (L) 08/08/2015 1604   HCT 41.6 10/15/2019 0855   HCT 27.3 (L) 08/08/2015 1604   PLT 241 10/15/2019 0855   PLT 375 08/08/2015 1604   MCV 87.2 10/15/2019 0855   MCV 68 (L) 08/08/2015 1604   MCV 84 09/07/2013 2310   MCH 28.7 10/15/2019 0855   MCHC 32.9 10/15/2019 0855   RDW 11.6 10/15/2019 0855   RDW 16.4 (H)  08/08/2015 1604   RDW 13.2 09/07/2013 2310   LYMPHSABS 1,198 10/15/2019 0855   LYMPHSABS 1.4 08/08/2015 1604   MONOABS 468 10/22/2016 0001   EOSABS 123 10/15/2019 0855   EOSABS 0.1 08/08/2015 1604   BASOSABS 22 10/15/2019 0855   BASOSABS 0.0 08/08/2015 1604   CMP     Component Value Date/Time   NA 136 10/15/2019 0855   NA 140 08/08/2015 1604   NA 139 09/07/2013 2310   K 4.6 10/15/2019 0855   K 4.1 09/07/2013 2310   CL 103 10/15/2019 0855   CL 107 09/07/2013 2310   CO2 26 10/15/2019 0855   CO2 26 09/07/2013 2310   GLUCOSE 90 10/15/2019 0855   GLUCOSE  95 09/07/2013 2310   BUN 19 10/15/2019 0855   BUN 19 09/08/2018 0910   BUN 25 (H) 09/07/2013 2310   CREATININE 0.74 10/15/2019 0855   CALCIUM 9.3 10/15/2019 0855   CALCIUM 9.1 09/07/2013 2310   PROT 6.9 10/15/2019 0855   PROT 6.9 08/08/2015 1604   PROT 7.1 09/07/2013 2310   ALBUMIN 4.0 06/25/2017 2016   ALBUMIN 4.5 08/08/2015 1604   ALBUMIN 3.8 09/07/2013 2310   AST 11 10/15/2019 0855   AST 23 09/07/2013 2310   ALT 17 10/15/2019 0855   ALT 31 09/07/2013 2310   ALKPHOS 50 06/25/2017 2016   ALKPHOS 69 09/07/2013 2310   BILITOT 0.7 10/15/2019 0855   BILITOT 0.4 08/08/2015 1604   BILITOT 0.3 09/07/2013 2310   GFRNONAA 96 10/15/2019 0855   GFRAA 112 10/15/2019 0855    Imaging Studies: No results found.  Assessment and Plan:   Sheryl Carr is a 49 y.o. 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  IBD is low on the differential given no blood in stool and otherwise stable symptoms for the last over a year and a half with no alarm symptoms present  I have discussed discontinuing Prilosec as that may be contributing to her diarrhea.  She is willing to  discontinue.  She can use as needed antacids or Tums if indigestion occurs infrequently as it is now.  If this worsens, I have asked her to notify us and we can start her on Pepcid instead.  She uses naproxen very sparingly about 3-4 times in a year.  I have discussed with her that these medications, called NSAID medications, can cause GI irritation and ulcers.  However, if she is taking it as infrequently as 3-4 times a year, to just continue to take it infrequently, no more than 1-2 times a week if needed, and if she needs to take it more frequently to notify us or PCP at that time  Dr Sheryl Carr  Speech recognition software was used to dictate the above note.

## 2019-10-20 NOTE — Patient Instructions (Signed)
Please take Metamucil daily. 

## 2019-10-23 ENCOUNTER — Other Ambulatory Visit: Payer: Self-pay

## 2019-10-23 ENCOUNTER — Encounter: Payer: BLUE CROSS/BLUE SHIELD | Admitting: Family Medicine

## 2019-10-23 ENCOUNTER — Ambulatory Visit (INDEPENDENT_AMBULATORY_CARE_PROVIDER_SITE_OTHER): Payer: BLUE CROSS/BLUE SHIELD | Admitting: Family Medicine

## 2019-10-23 ENCOUNTER — Encounter: Payer: Self-pay | Admitting: Family Medicine

## 2019-10-23 VITALS — BP 134/82 | HR 70 | Temp 98.4°F | Ht 68.0 in | Wt 160.4 lb

## 2019-10-23 DIAGNOSIS — E7800 Pure hypercholesterolemia, unspecified: Secondary | ICD-10-CM | POA: Diagnosis not present

## 2019-10-23 DIAGNOSIS — M722 Plantar fascial fibromatosis: Secondary | ICD-10-CM

## 2019-10-23 DIAGNOSIS — Z0000 Encounter for general adult medical examination without abnormal findings: Secondary | ICD-10-CM

## 2019-10-23 DIAGNOSIS — I10 Essential (primary) hypertension: Secondary | ICD-10-CM

## 2019-10-23 MED ORDER — LOSARTAN POTASSIUM 50 MG PO TABS: 50.0000 mg | ORAL_TABLET | Freq: Every day | ORAL | 1 refills | Status: DC

## 2019-10-23 NOTE — Assessment & Plan Note (Signed)
Well-controlled HTN - Home BP readings normal infrequent  No known complications  Plan:  1. Continue current BP regimen - Losartan 50mg  daily - defer plan to taper or reduce dose for now 2. Encourage improved lifestyle - low sodium diet, improve regular exercise 3. Continue monitor BP outside office, bring readings to next visit, if persistently >140/90 or new symptoms notify office sooner

## 2019-10-23 NOTE — Patient Instructions (Addendum)
Thank you for coming to the office today.  Keep up the great work! Improve regular exercise as discussed, can help future LDL cholesterol control. You do not need a cholesterol medicine at this time.  Will stay tuned for Colonoscopy results. And Mammogram as well.   Please schedule a Follow-up Appointment to: Return in about 1 year (around 10/22/2020) for Annual Physical.  If you have any other questions or concerns, please feel free to call the office or send a message through [place]. You may also schedule an earlier appointment if necessary.  Additionally, you may be receiving a survey about your experience at our office within a few days to 1 week by e-mail or mail. We value your feedback.  Nobie Putnam, DO [place]  ----------------------------------------------------------------------  You most likely have Plantar Fasciitis of heel / foot. - This is inflammation of the fibrous connection on the bottom of the foot, and can have small micro tears over time that become painful. It usually will have flare ups lasting days to weeks, and may come back after it heals if it is re-aggravated again. - Often there is a bone spur or arthritis of the heel bone that causes this - Also it may be caused by abnormal footwear, walking pattern or other problems  If you are experiencing an acute flare with pain, this is usually worst first thing in the morning when the plantar fascia is tight and stiff. First step out of bed is painful usually, and it may gradually improve with stretching and walking.   Recommend trial of Anti-inflammatory with Naproxen Aleve 250 mg tabs - take one to two pills with food and plenty of water TWICE daily every day (breakfast and dinner), for next 3-5 days only, then you may take only as needed - DO NOT TAKE any ibuprofen, motrin while you are taking this medicine - It is safe to take Tylenol Ext Str 500mg  tabs - take 1 to 2 (max dose  1000mg ) every 6 hours as needed for breakthrough pain, max 24 hour daily dose is 6 to 8 tablets or 4000mg   Recommend: - Rest / relative rest with activity modification avoid overuse / prolonged stand - Ice packs (make sure you use a towel or sock / something to protect skin)  May also try topical muscle rub, icy hot, tiger balm  Start the exercises listed below, gradually increase them as instructed. May be sore at first and hopefully will stretch out and help reduce pain later.  If not improving after 1-2 weeks on medicine, and stretching exercises and some rest - then can contact our office again for re-evaluation may consider other causes or can refer you to Orthopedic specialist to have second opinion, and consider a steroid injection.               Plantar Fascia Stretches / Exercises  See other page with pictures of each exercise.  Start with 1 or 2 of these exercises that you are most comfortable with. Do not do any exercises that cause you significant worsening pain. Some of these may cause some "stretching soreness" but it should go away after you stop the exercise, and get better over time. Gradually increase up to 3-4 exercises as tolerated.  You may begin exercising the muscles of your foot right away by gently stretching them as follows:  Stretching: Towel stretch: Sit on a hard surface with your injured leg stretched out in front of you. Loop a towel  around the ball of your foot and pull the towel toward your body keeping your knee straight. Hold this position for 15 to 30 seconds then relax. Repeat 3 times. When the towel stretch becomes to easy, you may begin doing the standing calf stretch.  Standing calf stretch: Facing a wall, put your hands against the wall at about eye level. Keep the injured leg back, the uninjured leg forward, and the heel of your injured leg on the floor. Turn your injured foot slightly inward (as if you were pigeon-toed) as you slowly  lean into the wall until you feel a stretch in the back of your calf. Hold for 15 to 30 seconds. Repeat 3 times. Do this exercise several times each day. When you can stand comfortably on your injured foot, you can begin stretching the bottom of your foot using the plantar fascia stretch.  Plantar fascia stretch: Stand with the ball of your injured foot on a stair. Reach for the bottom step with your heel until you feel a stretch in the arch of your foot. Hold this position for 15 to 30 seconds and then relax. Repeat 3 times. After you have stretched the bottom muscles of your foot, you can begin strengthening the top muscles of your foot.  Frozen can roll: Roll your bare injured foot back and forth from your heel to your mid-arch over a frozen juice can. Repeat for 3 to 5 minutes. This exercise is particularly helpful if done first thing in the morning. Towel pickup: With your heel on the ground, pick up a towel with your toes. Release. Repeat 10 to 20 times. When this gets easy, add more resistance by placing a book or small weight on the towel. Static and dynamic balance exercises Place a chair next to your non-injured leg and stand upright. (This will provide you with balance if needed.) Stand on your injured foot. Try to raise the arch of your foot while keeping your toes on the floor. Try to maintain this position and balance on your injured side for 30 seconds. This exercise can be made more difficult by doing it on a piece of foam or a pillow, or with your eyes closed. Stand in the same position as above. Keep your foot in this position and reach forward in front of you with your injured side's hand, allowing your knee to bend. Repeat this 10 times while maintaining the arch height. This exercise can be made more difficult by reaching farther in front of you. Do 2 sets. Stand in the same position as above. While maintaining your arch height, reach the injured side's hand across your body toward  the chair. The farther you reach, the more challenging the exercise. Do 2 sets of 10.  Next, you can begin strengthening the muscles of your foot and lower leg by using elastic tubing.  Strengthening: Resisted dorsiflexion: Sit with your injured leg out straight and your foot facing a doorway. Tie a loop in one end of the tubing. Put your foot through the loop so that the tubing goes around the arch of your foot. Tie a knot in the other end of the tubing and shut the knot in the door. Move backward until there is tension in the tubing. Keeping your knee straight, pull your foot toward your body, stretching the tubing. Slowly return to the starting position. Do 3 sets of 10. Resisted plantar flexion: Sit with your leg outstretched and loop the middle section of the tubing around  the ball of your foot. Hold the ends of the tubing in both hands. Gently press the ball of your foot down and point your toes, stretching the tubing. Return to the starting position. Do 3 sets of 10. Resisted inversion: Sit with your legs out straight and cross your uninjured leg over your injured ankle. Wrap the tubing around the ball of your injured foot and then loop it around your uninjured foot so that the tubing is anchored there at one end. Hold the other end of the tubing in your hand. Turn your injured foot inward and upward. This will stretch the tubing. Return to the starting position. Do 3 sets of 10. Resisted eversion: Sit with both legs stretched out in front of you, with your feet about a shoulder's width apart. Tie a loop in one end of the tubing. Put your injured foot through the loop so that the tubing goes around the arch of that foot and wraps around the outside of the uninjured foot. Hold onto the other end of the tubing with your hand to provide tension. Turn your injured foot up and out. Make sure you keep your uninjured foot still so that it will allow the tubing to stretch as you move your injured foot. Return  to the starting position. Do 3 sets of 10.

## 2019-10-23 NOTE — Progress Notes (Signed)
Subjective:    Patient ID: Sheryl Carr, female    DOB: [birth year], 48 y.o.   MRN: [id number]  EZELLE Carr is a 48 y.o. female presenting on 10/23/2019 for Annual Exam (Pt notice discomfort with her right heel when she stand up  after resting the foot., mostly early morning when she get up.)   HPI   Here for Annual Physical and lab review.  Wellness / Lifestyle BMI >24 / Elevated LDL Admits gained 20 lbs in 7 years, she attributes some to diet, lifestyle Exercise limited. She has goal to improve activity. Last lab showed lipids LDL 122, otherwise normal results. Not on cholesterol medicine and has not needed this. No other significant cholesterol history.   Follow-up GI / Diarrhea Reviewed recent updates, she has been taken off PPI medicine, limit oral NSAIDs, and has upcoming colonoscopy w/ anticipated biopsy to check for other causes of her symptoms and screening for polyp / colon CA.  Additional complaint:  Right Heel Pain / Suspected Plantar Fasciitis Reports for a while she has had some issue with R heel pain and foot, worse in morning when get up. Usually improves with walking around some. She did have a recent experience running or jogging while playing with family. Worse again if seated prolonged or sedentary then starts to walk and causes her pain again. Improved with some new shoe inserts.  Health Maintenance:  Next annual mammogram coming up, had prior bi-rads 3 that was negative 05/2019.   Depression screen [facility] 2/9 09/03/2019 10/21/2018 11/19/2017  Decreased Interest 0 0 0  Down, Depressed, Hopeless 0 0 0  PHQ - 2 Score 0 0 0    Past Medical History:  Diagnosis Date  . Anemia   . Chronic kidney disease    H/O KIDNEY STONES  . Endometrial polyp 2016   hysteroscopically removed  . GERD (gastroesophageal reflux disease)   . H/O mammogram 05/11/2015  . Hematuria   . History of kidney stones   . History of uterine fibroid    s/p hysterectomy  08/2017  . HTN (hypertension)    PT WAS TAKEN OFF HER BP MED IN MAY/JUNE OF 2016 BUT BP IS ELEVATED AGAIN   Past Surgical History:  Procedure Laterality Date  . ABDOMINAL HYSTERECTOMY    . CESAREAN SECTION    . HYSTERECTOMY ABDOMINAL WITH SALPINGECTOMY Bilateral 08/19/2017   Procedure: HYSTERECTOMY ABDOMINAL WITH BILATERAL SALPINGECTOMY;  Surgeon: [name], MD;  Location: [facility] ORS;  Service: Gynecology;  Laterality: Bilateral;  . HYSTEROSCOPY W/D&C N/A 11/14/2015   Procedure: DILATATION AND CURETTAGE /HYSTEROSCOPY;  Surgeon: [name], MD;  Location: [facility] ORS;  Service: Gynecology;  Laterality: N/A;  . KIDNEY STONE SURGERY     Social History   Socioeconomic History  . Marital status: Married    Spouse name: Sheryl Carr  . Number of children: 2  . Years of education: college  . Highest education level: Not on file  Occupational History  . Occupation: n/a    Employer: [employer]  . Financial resource strain: Not on file  . Food insecurity    Worry: Not on file    Inability: Not on file  . Transportation needs    Medical: Not on file    Non-medical: Not on file  Tobacco Use  . Smoking status: Former Smoker    Quit date: 09/24/1990    Years since quitting: 29.0  . Smokeless tobacco: Former Network engineer and Sexual Activity  . Alcohol  use: Yes    Alcohol/week: 2.0 - 3.0 standard drinks    Types: 2 - 3 Standard drinks or equivalent per week    Comment: social  . Drug use: No  . Sexual activity: Yes    Birth control/protection: None  Lifestyle  . Physical activity    Days per week: Not on file    Minutes per session: Not on file  . Stress: Not on file  Relationships  . Social Herbalist on phone: Not on file    Gets together: Not on file    Attends religious service: Not on file    Active member of club or organization: Not on file    Attends meetings of clubs or organizations: Not on file    Relationship status: Not on file  . Intimate  partner violence    Fear of current or ex partner: Not on file    Emotionally abused: Not on file    Physically abused: Not on file    Forced sexual activity: Not on file  Other Topics Concern  . Not on file  Social History Narrative   Patient is married Sheryl Carr) and lives at home with her husband and one child, one child is in college.   Patient is working full-time.   Patient has a college education.   Patient is right-handed.   Patient drinks 1-2 cups of coffee daily, soda and tea are occasionally, not daily.   Family History  Problem Relation Age of Onset  . Diabetes Mellitus I Mother   . Diabetes Mellitus I Maternal Grandmother   . Prostate cancer Maternal Grandfather   . Diabetes Mellitus I Paternal Grandmother   . Breast cancer Neg Hx    Current Outpatient Medications on File Prior to Visit  Medication Sig  . econazole nitrate 1 % cream Apply once a day as needed for rash.  . Multiple Vitamins-Calcium (ONE-A-DAY WOMENS FORMULA PO) Take 1 tablet by mouth daily.   . diclofenac Sodium (VOLTAREN) 1 % GEL diclofenac 1 % topical gel  APP 2 GRAMS EXT AA TID PRF THUMB / HAND PAIN FROM ARTHRITIS  . naproxen (NAPROSYN) 500 MG tablet Take 500 mg by mouth 2 (two) times daily with a meal.   No current facility-administered medications on file prior to visit.     Review of Systems  Constitutional: Negative for activity change, appetite change, chills, diaphoresis, fatigue and fever.  HENT: Negative for congestion and hearing loss.   Eyes: Negative for visual disturbance.  Respiratory: Negative for apnea, cough, chest tightness, shortness of breath and wheezing.   Cardiovascular: Negative for chest pain, palpitations and leg swelling.  Gastrointestinal: Negative for abdominal pain, constipation, diarrhea, nausea and vomiting.  Endocrine: Negative for cold intolerance.  Genitourinary: Negative for dysuria, frequency and hematuria.  Musculoskeletal: Negative for arthralgias and neck  pain.       Heel pain, R  Skin: Negative for rash.  Neurological: Negative for dizziness, weakness, light-headedness, numbness and headaches.  Hematological: Negative for adenopathy.  Psychiatric/Behavioral: Negative for behavioral problems, dysphoric mood and sleep disturbance.   Per HPI unless specifically indicated above     Objective:    BP 134/82 (BP Location: Left Arm, Patient Position: Sitting, Cuff Size: Normal)   Pulse 70   Temp 98.4 F (36.9 C) (Oral)   Ht 5\' 8"  (1.727 m)   Wt 160 lb 6.4 oz (72.8 kg)   LMP 08/09/2017 (Exact Date)   BMI 24.39 kg/m   Wt  Readings from Last 3 Encounters:  10/23/19 160 lb 6.4 oz (72.8 kg)  10/20/19 159 lb (72.1 kg)  09/03/19 155 lb 9.6 oz (70.6 kg)    Physical Exam Vitals signs and nursing note reviewed.  Constitutional:      General: She is not in acute distress.    Appearance: She is well-developed. She is not diaphoretic.     Comments: Well-appearing, comfortable, cooperative  HENT:     Head: Normocephalic and atraumatic.  Eyes:     General:        Right eye: No discharge.        Left eye: No discharge.     Conjunctiva/sclera: Conjunctivae normal.     Pupils: Pupils are equal, round, and reactive to light.  Neck:     Musculoskeletal: Normal range of motion and neck supple.     Thyroid: No thyromegaly.  Cardiovascular:     Rate and Rhythm: Normal rate and regular rhythm.     Heart sounds: Normal heart sounds. No murmur.  Pulmonary:     Effort: Pulmonary effort is normal. No respiratory distress.     Breath sounds: Normal breath sounds. No wheezing or rales.  Abdominal:     General: Bowel sounds are normal. There is no distension.     Palpations: Abdomen is soft. There is no mass.     Tenderness: There is no abdominal tenderness.  Musculoskeletal: Normal range of motion.        General: No tenderness.     Comments: Upper / Lower Extremities: - Normal muscle tone, strength bilateral upper extremities 5/5, lower extremities  5/5  Right foot Normal appearance, has good arch. No edema or deformity. Localized reproducible tenderness medial aspect of heel and posterior aspect. Achilles intact and non tender. No other issue higher ankle or over malleoli or forefoot or mid foot. Ambulate without problem.  Lymphadenopathy:     Cervical: No cervical adenopathy.  Skin:    General: Skin is warm and dry.     Findings: No erythema or rash.  Neurological:     Mental Status: She is alert and oriented to person, place, and time.     Comments: Distal sensation intact to light touch all extremities  Psychiatric:        Behavior: Behavior normal.     Comments: Well groomed, good eye contact, normal speech and thoughts       Results for orders placed or performed in visit on 10/15/19  TSH  Result Value Ref Range   TSH 3.46 mIU/L  Lipid panel  Result Value Ref Range   Cholesterol 192 <200 mg/dL   HDL 53 > OR = 50 mg/dL   Triglycerides 78 <150 mg/dL   LDL Cholesterol (Calc) 122 (H) mg/dL (calc)   Total CHOL/HDL Ratio 3.6 <5.0 (calc)   Non-HDL Cholesterol (Calc) 139 (H) <130 mg/dL (calc)  COMPLETE METABOLIC PANEL WITH GFR  Result Value Ref Range   Glucose, Bld 90 65 - 99 mg/dL   BUN 19 7 - 25 mg/dL   Creat 0.74 0.50 - 1.10 mg/dL   GFR, Est Non African American 96 > OR = 60 mL/min/1.70m2   GFR, Est African American 112 > OR = 60 mL/min/1.46m2   BUN/Creatinine Ratio NOT APPLICABLE 6 - 22 (calc)   Sodium 136 135 - 146 mmol/L   Potassium 4.6 3.5 - 5.3 mmol/L   Chloride 103 98 - 110 mmol/L   CO2 26 20 - 32 mmol/L   Calcium 9.3 8.6 -  10.2 mg/dL   Total Protein 6.9 6.1 - 8.1 g/dL   Albumin 4.3 3.6 - 5.1 g/dL   Globulin 2.6 1.9 - 3.7 g/dL (calc)   AG Ratio 1.7 1.0 - 2.5 (calc)   Total Bilirubin 0.7 0.2 - 1.2 mg/dL   Alkaline phosphatase (APISO) 56 31 - 125 U/L   AST 11 10 - 35 U/L   ALT 17 6 - 29 U/L  CBC with Differential/Platelet  Result Value Ref Range   WBC 5.6 3.8 - 10.8 Thousand/uL   RBC 4.77 3.80 - 5.10  Million/uL   Hemoglobin 13.7 11.7 - 15.5 g/dL   HCT 41.6 35.0 - 45.0 %   MCV 87.2 80.0 - 100.0 fL   MCH 28.7 27.0 - 33.0 pg   MCHC 32.9 32.0 - 36.0 g/dL   RDW 11.6 11.0 - 15.0 %   Platelets 241 140 - 400 Thousand/uL   MPV 10.5 7.5 - 12.5 fL   Neutro Abs 3,881 1,500 - 7,800 cells/uL   Lymphs Abs 1,198 850 - 3,900 cells/uL   Absolute Monocytes 375 200 - 950 cells/uL   Eosinophils Absolute 123 15 - 500 cells/uL   Basophils Absolute 22 0 - 200 cells/uL   Neutrophils Relative % 69.3 %   Total Lymphocyte 21.4 %   Monocytes Relative 6.7 %   Eosinophils Relative 2.2 %   Basophils Relative 0.4 %  Hemoglobin A1c  Result Value Ref Range   Hgb A1c MFr Bld 5.0 <5.7 % of total Hgb   Mean Plasma Glucose 97 (calc)   eAG (mmol/L) 5.4 (calc)      Assessment & Plan:   Problem List Items Addressed This Visit    Essential hypertension    Well-controlled HTN - Home BP readings normal infrequent  No known complications  Plan:  1. Continue current BP regimen - Losartan 50mg  daily - defer plan to taper or reduce dose for now 2. Encourage improved lifestyle - low sodium diet, improve regular exercise 3. Continue monitor BP outside office, bring readings to next visit, if persistently >140/90 or new symptoms notify office sooner      Relevant Medications   losartan (COZAAR) 50 MG tablet   Elevated LDL cholesterol level    Mostly Controlled cholesterol on lifestyle Goal to increase exercise Last lipid panel 10/2019 - only mild elevated LDL 122 Calculated ASCVD 10 yr risk score < 1 %  Plan: 1. Encourage improved lifestyle - low carb/cholesterol, reduce portion size, continue improving regular exercise  F/u yearly       Other Visit Diagnoses    Annual physical exam    -  Primary   Plantar fasciitis of right foot          Updated Health Maintenance information Reviewed recent lab results with patient Encouraged improvement to lifestyle with diet and exercise - Goal of maintain healthy  weight as discussed, BMI >24 which is appropriate.   #R plantar fasciitis Clinically consistent with acute on subacute R plantar fasciitis based on history and exam, localized pain. - No known injury. Unlikely fracture based on history, no bony tenderness. - Considered achilles tendonitis but no overuse and location not consistent - Limited conservative therapy   Plan: 1. Reviewed diagnosis and management of plantar fasciitis 2. Start rx NSAID trial - Naproxen / ALeve OTC 250-500mg  BID wc x THREE DAYS ONLY limit use due to risk with off PPI now and GI concern, then take PRN  3. May take Tylenol PRN breakthrough 4. May use  topical muscle rub, ice 5. Emphasized importance of relative rest, ice, and avoid prolonged standing and overuse 6. Handout given and explained appropriate stretching and home exercises important to do first thing in morning and later F/u if not improving consider Podiatry   Meds ordered this encounter  Medications  . losartan (COZAAR) 50 MG tablet    Sig: Take 1 tablet (50 mg total) by mouth daily.    Dispense:  90 tablet    Refill:  1      Follow up plan: Return in about 1 year (around 10/22/2020) for Annual Physical.  Nobie Putnam, DO [facility] 10/23/2019, 9:58 AM

## 2019-10-23 NOTE — Assessment & Plan Note (Signed)
Mostly Controlled cholesterol on lifestyle Goal to increase exercise Last lipid panel 10/2019 - only mild elevated LDL 122 Calculated ASCVD 10 yr risk score < 1 %  Plan: 1. Encourage improved lifestyle - low carb/cholesterol, reduce portion size, continue improving regular exercise  F/u yearly

## 2019-10-29 ENCOUNTER — Other Ambulatory Visit
Admission: RE | Admit: 2019-10-29 | Discharge: 2019-10-29 | Disposition: A | Discharge status: Home / Self Care | Payer: BLUE CROSS/BLUE SHIELD | Source: Ambulatory Visit | Attending: Gastroenterology | Admitting: Gastroenterology

## 2019-10-29 DIAGNOSIS — Z01812 Encounter for preprocedural laboratory examination: Secondary | ICD-10-CM | POA: Insufficient documentation

## 2019-10-29 DIAGNOSIS — Z20828 Contact with and (suspected) exposure to other viral communicable diseases: Secondary | ICD-10-CM | POA: Diagnosis not present

## 2019-10-29 LAB — SARS CORONAVIRUS 2 (TAT 6-24 HRS): SARS Coronavirus 2: NEGATIVE

## 2019-11-02 ENCOUNTER — Ambulatory Visit: Payer: BLUE CROSS/BLUE SHIELD | Admitting: Certified Registered"

## 2019-11-02 ENCOUNTER — Other Ambulatory Visit: Payer: Self-pay

## 2019-11-02 ENCOUNTER — Encounter: Payer: Self-pay | Admitting: *Deleted

## 2019-11-02 ENCOUNTER — Encounter
Admission: RE | Disposition: A | Discharge status: Home / Self Care | Payer: Self-pay | Source: Home / Self Care | Attending: Gastroenterology

## 2019-11-02 ENCOUNTER — Ambulatory Visit
Admission: RE | Admit: 2019-11-02 | Discharge: 2019-11-02 | Disposition: A | Discharge status: Home / Self Care | Payer: BLUE CROSS/BLUE SHIELD | Attending: Gastroenterology | Admitting: Gastroenterology

## 2019-11-02 DIAGNOSIS — R197 Diarrhea, unspecified: Secondary | ICD-10-CM | POA: Diagnosis not present

## 2019-11-02 DIAGNOSIS — N189 Chronic kidney disease, unspecified: Secondary | ICD-10-CM | POA: Insufficient documentation

## 2019-11-02 DIAGNOSIS — Z791 Long term (current) use of non-steroidal anti-inflammatories (NSAID): Secondary | ICD-10-CM | POA: Insufficient documentation

## 2019-11-02 DIAGNOSIS — I129 Hypertensive chronic kidney disease with stage 1 through stage 4 chronic kidney disease, or unspecified chronic kidney disease: Secondary | ICD-10-CM | POA: Diagnosis not present

## 2019-11-02 DIAGNOSIS — Z79899 Other long term (current) drug therapy: Secondary | ICD-10-CM | POA: Diagnosis not present

## 2019-11-02 DIAGNOSIS — K635 Polyp of colon: Secondary | ICD-10-CM | POA: Insufficient documentation

## 2019-11-02 DIAGNOSIS — Z87891 Personal history of nicotine dependence: Secondary | ICD-10-CM | POA: Insufficient documentation

## 2019-11-02 DIAGNOSIS — Z1211 Encounter for screening for malignant neoplasm of colon: Secondary | ICD-10-CM | POA: Diagnosis not present

## 2019-11-02 DIAGNOSIS — K219 Gastro-esophageal reflux disease without esophagitis: Secondary | ICD-10-CM | POA: Insufficient documentation

## 2019-11-02 DIAGNOSIS — K648 Other hemorrhoids: Secondary | ICD-10-CM | POA: Insufficient documentation

## 2019-11-02 HISTORY — PX: COLONOSCOPY WITH PROPOFOL: SHX5780

## 2019-11-02 SURGERY — COLONOSCOPY WITH PROPOFOL
Anesthesia: General

## 2019-11-02 MED ORDER — PROPOFOL 10 MG/ML IV BOLUS
INTRAVENOUS | Status: DC | PRN
  Administered 2019-11-02: 50 mg via INTRAVENOUS

## 2019-11-02 MED ORDER — PROPOFOL 500 MG/50ML IV EMUL
INTRAVENOUS | Status: DC | PRN
  Administered 2019-11-02: 130 ug/kg/min via INTRAVENOUS

## 2019-11-02 MED ORDER — LIDOCAINE HCL (PF) 1 % IJ SOLN
INTRAMUSCULAR | Status: AC
  Administered 2019-11-02: 09:00:00 2 mL
  Filled 2019-11-02: qty 2

## 2019-11-02 MED ORDER — FAMOTIDINE 20 MG PO TABS: 20.0000 mg | ORAL_TABLET | Freq: Two times a day (BID) | ORAL | 0 refills | Status: DC

## 2019-11-02 MED ORDER — SODIUM CHLORIDE 0.9 % IV SOLN
INTRAVENOUS | Status: DC
  Administered 2019-11-02: 09:00:00 via INTRAVENOUS

## 2019-11-02 NOTE — Transfer of Care (Signed)
Immediate Anesthesia Transfer of Care Note  Patient: [name]  Procedure(s) Performed: COLONOSCOPY WITH PROPOFOL (N/A )  Patient Location: PACU  Anesthesia Type:General  Level of Consciousness: awake and drowsy  Airway & Oxygen Therapy: Patient Spontanous Breathing and Patient connected to face mask oxygen  Post-op Assessment: Report given to RN and Post -op Vital signs reviewed and stable  Post vital signs: Reviewed and stable  Last Vitals:  Vitals Value Taken Time  BP 109/68 11/02/19 0927  Temp    Pulse 69 11/02/19 0928  Resp 14 11/02/19 0928  SpO2 100 % 11/02/19 0928  Vitals shown include unvalidated device data.  Last Pain:  Vitals:   11/02/19 0928  TempSrc: (P) Temporal  PainSc:          Complications: No apparent anesthesia complications

## 2019-11-02 NOTE — Op Note (Addendum)
[facility] Gastroenterology Patient Name: Sheryl Carr Procedure Date: 11/02/2019 8:46 AM MRN: [id number] Account #: [id number] Date of Birth: [birth year] Admit Type: Outpatient Age: 48 Room: [facility] ENDO ROOM 1 Gender: Female Note Status: Finalized Procedure:             Colonoscopy Indications:           Screening for colorectal malignant neoplasm Providers:              B. Bonna Gains MD, MD Referring MD:          Olin Hauser (Referring MD) Medicines:             Monitored Anesthesia Care Complications:         No immediate complications. Procedure:             Pre-Anesthesia Assessment:                        - ASA Grade Assessment: II - A patient with mild                         systemic disease.                        - Prior to the procedure, a History and Physical was                         performed, and patient medications, allergies and                         sensitivities were reviewed. The patient's tolerance                         of previous anesthesia was reviewed.                        - The risks and benefits of the procedure and the                         sedation options and risks were discussed with the                         patient. All questions were answered and informed                         consent was obtained.                        - Patient identification and proposed procedure were                         verified prior to the procedure by the physician, the                         nurse, the anesthesiologist, the anesthetist and the                         technician. The procedure was verified in the                         procedure room.  After obtaining informed consent, the colonoscope was                         passed under direct vision. Throughout the procedure,                         the patient's blood pressure, pulse, and oxygen                         saturations  were monitored continuously. The                         Colonoscope was introduced through the anus and                         advanced to the the cecum, identified by appendiceal                         orifice and ileocecal valve. The colonoscopy was                         performed with ease. The patient tolerated the                         procedure well. The quality of the bowel preparation                         was good. Findings:      Hemorrhoids were found on perianal exam.      Two sessile polyps were found in the transverse colon and ascending       colon. The polyps were 3 to 4 mm in size. These polyps were removed with       a cold biopsy forceps. Resection and retrieval were complete.      A 5 mm polyp was found in the ascending colon. The polyp was sessile.       The polyp was removed with a cold snare. Resection and retrieval were       complete.      The exam was otherwise without abnormality.      The rectum, sigmoid colon, descending colon, transverse colon, ascending       colon and cecum appeared normal. Biopsies for histology were taken with       a cold forceps for evaluation of microscopic colitis.      The retroflexed view of the distal rectum and anal verge was normal and       showed no anal or rectal abnormalities.      Non-bleeding internal hemorrhoids were found during retroflexion. Impression:            - Hemorrhoids found on perianal exam.                        - Two 3 to 4 mm polyps in the transverse colon and in                         the ascending colon, removed with a cold biopsy                         forceps. Resected  and retrieved.                        - One 5 mm polyp in the ascending colon, removed with                         a cold snare. Resected and retrieved.                        - The examination was otherwise normal.                        - The rectum, sigmoid colon, descending colon,                         transverse colon,  ascending colon and cecum are normal.                        - The distal rectum and anal verge are normal on                         retroflexion view.                        - Non-bleeding internal hemorrhoids. Recommendation:        - Discharge patient to home (with escort).                        - High fiber diet.                        - Advance diet as tolerated.                        - Continue present medications.                        - Await pathology results.                        - Repeat colonoscopy in 5 years.                        - The findings and recommendations were discussed with                         the patient.                        - The findings and recommendations were discussed with                         the patient's family.                        - Return to primary care physician as previously                         scheduled.                        - Pt reported significant indigestion since stopping  omeprazole and using multiple tums a day. Will start                         pepcid due to this Procedure Code(s):     --- Professional ---                        [phone], Colonoscopy, flexible; with removal of                         tumor(s), polyp(s), or other lesion(s) by snare                         technique                        45380, 59, Colonoscopy, flexible; with biopsy, single                         or multiple Diagnosis Code(s):     --- Professional ---                        K63.5, Polyp of colon                        Z12.11, Encounter for screening for malignant neoplasm                         of colon                        K64.8, Other hemorrhoids CPT copyright 2019 American Medical Association. All rights reserved. The codes documented in this report are preliminary and upon coder review may  be revised to meet current compliance requirements.  Vonda Antigua, MD Margretta Sidle B. Bonna Gains MD,  MD 11/02/2019 9:34:29 AM This report has been signed electronically. Number of Addenda: 0 Note Initiated On: 11/02/2019 8:46 AM Scope Withdrawal Time: 0 hours 19 minutes 56 seconds  Total Procedure Duration: 0 hours 28 minutes 40 seconds  Estimated Blood Loss:  Estimated blood loss: none.      [facility]

## 2019-11-02 NOTE — Anesthesia Postprocedure Evaluation (Signed)
Anesthesia Post Note  Patient: Sheryl Carr  Procedure(s) Performed: COLONOSCOPY WITH PROPOFOL (N/A )  Patient location during evaluation: Endoscopy Anesthesia Type: General Level of consciousness: awake and alert and oriented Pain management: pain level controlled Vital Signs Assessment: post-procedure vital signs reviewed and stable Respiratory status: spontaneous breathing, nonlabored ventilation and respiratory function stable Cardiovascular status: blood pressure returned to baseline and stable Postop Assessment: no signs of nausea or vomiting Anesthetic complications: no     Last Vitals:  Vitals:   11/02/19 0819 11/02/19 0928  BP: (!) 124/95   Pulse: 77   Resp: 16   Temp: (!) 36.4 C (!) 36.2 C  SpO2: 100%     Last Pain:  Vitals:   11/02/19 0928  TempSrc: Temporal  PainSc: 0-No pain                 Trebor Galdamez

## 2019-11-02 NOTE — Anesthesia Preprocedure Evaluation (Signed)
Anesthesia Evaluation  Patient identified by MRN, date of birth, ID band Patient awake    Reviewed: Allergy & Precautions, NPO status , Patient's Chart, lab work & pertinent test results  History of Anesthesia Complications Negative for: history of anesthetic complications  Airway Mallampati: II  TM Distance: >3 FB Neck ROM: Full    Dental no notable dental hx.    Pulmonary neg sleep apnea, neg COPD, former smoker,    breath sounds clear to auscultation- rhonchi (-) wheezing      Cardiovascular hypertension, Pt. on medications (-) CAD, (-) Past MI, (-) Cardiac Stents and (-) CABG  Rhythm:Regular Rate:Normal - Systolic murmurs and - Diastolic murmurs    Neuro/Psych neg Seizures Anxiety negative neurological ROS     GI/Hepatic Neg liver ROS, GERD  ,  Endo/Other  negative endocrine ROSneg diabetes  Renal/GU Renal disease: hx of nephrolithiasis.     Musculoskeletal negative musculoskeletal ROS (+)   Abdominal (+) - obese,   Peds  Hematology  (+) anemia ,   Anesthesia Other Findings Past Medical History: No date: Anemia No date: Chronic kidney disease     Comment:  H/O KIDNEY STONES 2016: Endometrial polyp     Comment:  hysteroscopically removed No date: GERD (gastroesophageal reflux disease) 05/11/2015: H/O mammogram No date: Hematuria No date: History of kidney stones No date: History of uterine fibroid     Comment:  s/p hysterectomy 08/2017 No date: HTN (hypertension)     Comment:  PT WAS TAKEN OFF HER BP MED IN MAY/JUNE OF 2016 BUT BP               IS ELEVATED AGAIN   Reproductive/Obstetrics                             Anesthesia Physical Anesthesia Plan  ASA: II  Anesthesia Plan: General   Post-op Pain Management:    Induction: Intravenous  PONV Risk Score and Plan: 2 and Propofol infusion  Airway Management Planned: Natural Airway  Additional Equipment:   Intra-op  Plan:   Post-operative Plan:   Informed Consent: I have reviewed the patients History and Physical, chart, labs and discussed the procedure including the risks, benefits and alternatives for the proposed anesthesia with the patient or authorized representative who has indicated his/her understanding and acceptance.     Dental advisory given  Plan Discussed with: CRNA and Anesthesiologist  Anesthesia Plan Comments:         Anesthesia Quick Evaluation

## 2019-11-02 NOTE — Anesthesia Post-op Follow-up Note (Signed)
Anesthesia QCDR form completed.        

## 2019-11-02 NOTE — H&P (Signed)
Vonda Antigua, MD [address] [place], [place], [place], [address] [address], [place], [place], [place], [address] Phone: [phone]  Fax: [phone]  Primary Care Physician:  Olin Hauser, DO   Pre-Procedure History & Physical: HPI:  Sheryl Carr is a 48 y.o. female is here for a colonoscopy.   Past Medical History:  Diagnosis Date  . Anemia   . Chronic kidney disease    H/O KIDNEY STONES  . Endometrial polyp 2016   hysteroscopically removed  . GERD (gastroesophageal reflux disease)   . H/O mammogram 05/11/2015  . Hematuria   . History of kidney stones   . History of uterine fibroid    s/p hysterectomy 08/2017  . HTN (hypertension)    PT WAS TAKEN OFF HER BP MED IN MAY/JUNE OF 2016 BUT BP IS ELEVATED AGAIN    Past Surgical History:  Procedure Laterality Date  . ABDOMINAL HYSTERECTOMY    . CESAREAN SECTION    . HYSTERECTOMY ABDOMINAL WITH SALPINGECTOMY Bilateral 08/19/2017   Procedure: HYSTERECTOMY ABDOMINAL WITH BILATERAL SALPINGECTOMY;  Surgeon: [name], MD;  Location: [facility];  Service: Gynecology;  Laterality: Bilateral;  . HYSTEROSCOPY W/D&C N/A 11/14/2015   Procedure: DILATATION AND CURETTAGE /HYSTEROSCOPY;  Surgeon: [name], MD;  Location: [facility];  Service: Gynecology;  Laterality: N/A;  . KIDNEY STONE SURGERY      Prior to Admission medications   Medication Sig Start Date End Date Taking? Authorizing Provider  losartan (COZAAR) 50 MG tablet Take 1 tablet (50 mg total) by mouth daily. 10/23/19  Yes Karamalegos, [name], DO  naproxen (NAPROSYN) 500 MG tablet Take 500 mg by mouth 2 (two) times daily with a meal.   Yes [provider]  diclofenac Sodium (VOLTAREN) 1 % GEL diclofenac 1 % topical gel  APP 2 GRAMS EXT AA TID PRF THUMB / HAND PAIN FROM ARTHRITIS    [provider]  econazole nitrate 1 % cream Apply once a day as needed for rash. 01/15/17   [provider]  Multiple  Vitamins-Calcium (ONE-A-DAY WOMENS FORMULA PO) Take 1 tablet by mouth daily.     [provider]    Allergies as of 10/20/2019 - Review Complete 10/20/2019  Allergen Reaction Noted  . Pollen extract  05/23/2015  . Penicillins Rash 05/23/2015    Family History  Problem Relation Age of Onset  . Diabetes Mellitus I Mother   . Diabetes Mellitus I Maternal Grandmother   . Prostate cancer Maternal Grandfather   . Diabetes Mellitus I Paternal Grandmother   . Breast cancer Neg Hx     Social History   Socioeconomic History  . Marital status: Married    Spouse name: [name]  . Number of children: 2  . Years of education: college  . Highest education level: Not on file  Occupational History  . Occupation: n/a    Employer: [employer]  . Financial resource strain: Not on file  . Food insecurity    Worry: Not on file    Inability: Not on file  . Transportation needs    Medical: Not on file    Non-medical: Not on file  Tobacco Use  . Smoking status: Former Smoker    Quit date: 09/24/1990    Years since quitting: 29.1  . Smokeless tobacco: Former Network engineer and Sexual Activity  . Alcohol use: Yes    Alcohol/week: 2.0 - 3.0 standard drinks    Types: 2 - 3 Standard drinks  or equivalent per week    Comment: social  . Drug use: No  . Sexual activity: Yes    Birth control/protection: None  Lifestyle  . Physical activity    Days per week: Not on file    Minutes per session: Not on file  . Stress: Not on file  Relationships  . Social Herbalist on phone: Not on file    Gets together: Not on file    Attends religious service: Not on file    Active member of club or organization: Not on file    Attends meetings of clubs or organizations: Not on file    Relationship status: Not on file  . Intimate partner violence    Fear of current or ex partner: Not on file    Emotionally abused: Not on file    Physically abused: Not on file    Forced  sexual activity: Not on file  Other Topics Concern  . Not on file  Social History Narrative   Patient is married [name]) and lives at home with her husband and one child, one child is in college.   Patient is working full-time.   Patient has a college education.   Patient is right-handed.   Patient drinks 1-2 cups of coffee daily, soda and tea are occasionally, not daily.    Review of Systems: See HPI, otherwise negative ROS  Physical Exam: BP (!) 124/95   Pulse 77   Temp (!) 97.5 F (36.4 C) (Temporal)   Resp 16   Ht 5\' 8"  (1.727 m)   LMP 08/09/2017 (Exact Date)   SpO2 100%   BMI 24.39 kg/m  General:   Alert,  pleasant and cooperative in NAD Head:  Normocephalic and atraumatic. Neck:  Supple; no masses or thyromegaly. Lungs:  Clear throughout to auscultation, normal respiratory effort.    Heart:  +S1, +S2, Regular rate and rhythm, No edema. Abdomen:  Soft, nontender and nondistended. Normal bowel sounds, without guarding, and without rebound.   Neurologic:  Alert and  oriented x4;  grossly normal neurologically.  Impression/Plan: LUANNE JERNBERG is here for a colonoscopy to be performed for average risk screening.  Risks, benefits, limitations, and alternatives regarding  colonoscopy have been reviewed with the patient.  Questions have been answered.  All parties agreeable.   Virgel Manifold, MD  11/02/2019, 8:31 AM

## 2019-11-03 ENCOUNTER — Ambulatory Visit
Admission: RE | Admit: 2019-11-03 | Discharge: 2019-11-03 | Disposition: A | Discharge status: Home / Self Care | Payer: BLUE CROSS/BLUE SHIELD | Source: Ambulatory Visit | Attending: Obstetrics and Gynecology | Admitting: Obstetrics and Gynecology

## 2019-11-03 ENCOUNTER — Encounter: Payer: Self-pay | Admitting: Gastroenterology

## 2019-11-03 DIAGNOSIS — R928 Other abnormal and inconclusive findings on diagnostic imaging of breast: Secondary | ICD-10-CM | POA: Diagnosis not present

## 2019-11-03 DIAGNOSIS — R921 Mammographic calcification found on diagnostic imaging of breast: Secondary | ICD-10-CM | POA: Diagnosis not present

## 2019-11-03 LAB — SURGICAL PATHOLOGY

## 2019-11-23 ENCOUNTER — Telehealth: Payer: Self-pay | Admitting: Obstetrics and Gynecology

## 2019-11-23 NOTE — Telephone Encounter (Signed)
Pt called in she  Is having isome uncomfortable pain lower right breast. Pt is requesting a call for the nurse. Please advise

## 2019-11-23 NOTE — Telephone Encounter (Signed)
Scheduled patient for breast pain on 11/24/2019.

## 2019-11-23 NOTE — Telephone Encounter (Signed)
LM for patient to return call.

## 2019-11-24 ENCOUNTER — Encounter: Payer: Self-pay | Admitting: Obstetrics and Gynecology

## 2019-11-24 ENCOUNTER — Ambulatory Visit (INDEPENDENT_AMBULATORY_CARE_PROVIDER_SITE_OTHER): Payer: BLUE CROSS/BLUE SHIELD | Admitting: Obstetrics and Gynecology

## 2019-11-24 ENCOUNTER — Other Ambulatory Visit: Payer: Self-pay

## 2019-11-24 VITALS — BP 112/67 | HR 80 | Ht 65.0 in | Wt 155.0 lb

## 2019-11-24 DIAGNOSIS — N6001 Solitary cyst of right breast: Secondary | ICD-10-CM | POA: Diagnosis not present

## 2019-11-24 DIAGNOSIS — E7800 Pure hypercholesterolemia, unspecified: Secondary | ICD-10-CM

## 2019-11-24 DIAGNOSIS — K2100 Gastro-esophageal reflux disease with esophagitis, without bleeding: Secondary | ICD-10-CM

## 2019-11-24 DIAGNOSIS — K529 Noninfective gastroenteritis and colitis, unspecified: Secondary | ICD-10-CM

## 2019-11-24 DIAGNOSIS — Z01419 Encounter for gynecological examination (general) (routine) without abnormal findings: Secondary | ICD-10-CM

## 2019-11-24 DIAGNOSIS — I10 Essential (primary) hypertension: Secondary | ICD-10-CM | POA: Diagnosis not present

## 2019-11-24 NOTE — Patient Instructions (Addendum)
Preventive Care 40-48 Years Old, Female Preventive care refers to visits with your health care provider and lifestyle choices that can promote health and wellness. This includes:  A yearly physical exam. This may also be called an annual well check.  Regular dental visits and eye exams.  Immunizations.  Screening for certain conditions.  Healthy lifestyle choices, such as eating a healthy diet, getting regular exercise, not using drugs or products that contain nicotine and tobacco, and limiting alcohol use. What can I expect for my preventive care visit? Physical exam Your health care provider will check your:  Height and weight. This may be used to calculate body mass index (BMI), which tells if you are at a healthy weight.  Heart rate and blood pressure.  Skin for abnormal spots. Counseling Your health care provider may ask you questions about your:  Alcohol, tobacco, and drug use.  Emotional well-being.  Home and relationship well-being.  Sexual activity.  Eating habits.  Work and work environment.  Method of birth control.  Menstrual cycle.  Pregnancy history. What immunizations do I need?  Influenza (flu) vaccine  This is recommended every year. Tetanus, diphtheria, and pertussis (Tdap) vaccine  You may need a Td booster every 10 years. Varicella (chickenpox) vaccine  You may need this if you have not been vaccinated. Zoster (shingles) vaccine  You may need this after age 60. Measles, mumps, and rubella (MMR) vaccine  You may need at least one dose of MMR if you were born in 1957 or later. You may also need a second dose. Pneumococcal conjugate (PCV13) vaccine  You may need this if you have certain conditions and were not previously vaccinated. Pneumococcal polysaccharide (PPSV23) vaccine  You may need one or two doses if you smoke cigarettes or if you have certain conditions. Meningococcal conjugate (MenACWY) vaccine  You may need this if you  have certain conditions. Hepatitis A vaccine  You may need this if you have certain conditions or if you travel or work in places where you may be exposed to hepatitis A. Hepatitis B vaccine  You may need this if you have certain conditions or if you travel or work in places where you may be exposed to hepatitis B. Haemophilus influenzae type b (Hib) vaccine  You may need this if you have certain conditions. Human papillomavirus (HPV) vaccine  If recommended by your health care provider, you may need three doses over 6 months. You may receive vaccines as individual doses or as more than one vaccine together in one shot (combination vaccines). Talk with your health care provider about the risks and benefits of combination vaccines. What tests do I need? Blood tests  Lipid and cholesterol levels. These may be checked every 5 years, or more frequently if you are over 50 years old.  Hepatitis C test.  Hepatitis B test. Screening  Lung cancer screening. You may have this screening every year starting at age 55 if you have a 30-pack-year history of smoking and currently smoke or have quit within the past 15 years.  Colorectal cancer screening. All adults should have this screening starting at age 50 and continuing until age 75. Your health care provider may recommend screening at age 45 if you are at increased risk. You will have tests every 1-10 years, depending on your results and the type of screening test.  Diabetes screening. This is done by checking your blood sugar (glucose) after you have not eaten for a while (fasting). You may have this   done every 1-3 years.  Mammogram. This may be done every 1-2 years. Talk with your health care provider about when you should start having regular mammograms. This may depend on whether you have a family history of breast cancer.  BRCA-related cancer screening. This may be done if you have a family history of breast, ovarian, tubal, or peritoneal  cancers.  Pelvic exam and Pap test. This may be done every 3 years starting at age 21. Starting at age 30, this may be done every 5 years if you have a Pap test in combination with an HPV test. Other tests  Sexually transmitted disease (STD) testing.  Bone density scan. This is done to screen for osteoporosis. You may have this scan if you are at high risk for osteoporosis. Follow these instructions at home: Eating and drinking  Eat a diet that includes fresh fruits and vegetables, whole grains, lean protein, and low-fat dairy.  Take vitamin and mineral supplements as recommended by your health care provider.  Do not drink alcohol if: ? Your health care provider tells you not to drink. ? You are pregnant, may be pregnant, or are planning to become pregnant.  If you drink alcohol: ? Limit how much you have to 0-1 drink a day. ? Be aware of how much alcohol is in your drink. In the [place] one drink equals one 12 oz bottle of beer (355 mL), one 5 oz glass of wine (148 mL), or one 1 oz glass of hard liquor (44 mL). Lifestyle  Take daily care of your teeth and gums.  Stay active. Exercise for at least 30 minutes on 5 or more days each week.  Do not use any products that contain nicotine or tobacco, such as cigarettes, e-cigarettes, and chewing tobacco. If you need help quitting, ask your health care provider.  If you are sexually active, practice safe sex. Use a condom or other form of birth control (contraception) in order to prevent pregnancy and STIs (sexually transmitted infections).  If told by your health care provider, take low-dose aspirin daily starting at age 50. What's next?  Visit your health care provider once a year for a well check visit.  Ask your health care provider how often you should have your eyes and teeth checked.  Stay up to date on all vaccines. This information is not intended to replace advice given to you by your health care provider. Make sure you  discuss any questions you have with your health care provider. Document Released: 12/23/2015 Document Revised: 08/07/2018 Document Reviewed: 08/07/2018 Elsevier Patient Education  2020 [employer] Breast Self-Awareness Breast self-awareness is knowing how your breasts look and feel. Doing breast self-awareness is important. It allows you to catch a breast problem early while it is still small and can be treated. All women should do breast self-awareness, including women who have had breast implants. Tell your doctor if you notice a change in your breasts. What you need:  A mirror.  A well-lit room. How to do a breast self-exam A breast self-exam is one way to learn what is normal for your breasts and to check for changes. To do a breast self-exam: Look for changes  1. Take off all the clothes above your waist. 2. Stand in front of a mirror in a room with good lighting. 3. Put your hands on your hips. 4. Push your hands down. 5. Look at your breasts and nipples in the mirror to see if one breast or nipple looks   different from the other. Check to see if: ? The shape of one breast is different. ? The size of one breast is different. ? There are wrinkles, dips, and bumps in one breast and not the other. 6. Look at each breast for changes in the skin, such as: ? Redness. ? Scaly areas. 7. Look for changes in your nipples, such as: ? Liquid around the nipples. ? Bleeding. ? Dimpling. ? Redness. ? A change in where the nipples are. Feel for changes  1. Lie on your back on the floor. 2. Feel each breast. To do this, follow these steps: ? Pick a breast to feel. ? Put the arm closest to that breast above your head. ? Use your other arm to feel the nipple area of your breast. Feel the area with the pads of your three middle fingers by making small circles with your fingers. For the first circle, press lightly. For the second circle, press harder. For the third circle, press even harder.  ? Keep making circles with your fingers at the different pressures as you move down your breast. Stop when you feel your ribs. ? Move your fingers a little toward the center of your body. ? Start making circles with your fingers again, this time going up until you reach your collarbone. ? Keep making up-and-down circles until you reach your armpit. Remember to keep using the three pressures. ? Feel the other breast in the same way. 3. Sit or stand in the tub or shower. 4. With soapy water on your skin, feel each breast the same way you did in step 2 when you were lying on the floor. Write down what you find Writing down what you find can help you remember what to tell your doctor. Write down:  What is normal for each breast.  Any changes you find in each breast, including: ? The kind of changes you find. ? Whether you have pain. ? Size and location of any lumps.  When you last had your menstrual period. General tips  Check your breasts every month.  If you are breastfeeding, the best time to check your breasts is after you feed your baby or after you use a breast pump.  If you get menstrual periods, the best time to check your breasts is 5-7 days after your menstrual period is over.  With time, you will become comfortable with the self-exam, and you will begin to know if there are changes in your breasts. Contact a doctor if you:  See a change in the shape or size of your breasts or nipples.  See a change in the skin of your breast or nipples, such as red or scaly skin.  Have fluid coming from your nipples that is not normal.  Find a lump or thick area that was not there before.  Have pain in your breasts.  Have any concerns about your breast health. Summary  Breast self-awareness includes looking for changes in your breasts, as well as feeling for changes within your breasts.  Breast self-awareness should be done in front of a mirror in a well-lit room.  You should  check your breasts every month. If you get menstrual periods, the best time to check your breasts is 5-7 days after your menstrual period is over.  Let your doctor know of any changes you see in your breasts, including changes in size, changes on the skin, pain or tenderness, or fluid from your nipples that is not normal. This   information is not intended to replace advice given to you by your health care provider. Make sure you discuss any questions you have with your health care provider. Document Released: 05/14/2008 Document Revised: 07/15/2018 Document Reviewed: 07/15/2018 Elsevier Patient Education  2020 [employer]  

## 2019-11-24 NOTE — Progress Notes (Signed)
GYNECOLOGY ANNUAL PHYSICAL EXAM PROGRESS NOTE  Subjective:    Sheryl Carr is a 48 y.o. married G2P2002 female who presents for an annual exam. The patient is sexually active. The patient wears seatbelts: yes. The patient participates in regular exercise: no. Has the patient ever been transfused or tattooed?: no.     The patient has the following complaints today: 1. Complains of right breast pain x 5 days. Notes feeling a nodule.  Worse in the mornings, tender but thinks it is due to lying on it at night. Has had a normal mammogram last month. Has also been noting occasional sharp sporadic since her last mammogram. Denies nipple discharge.  2. Has been seeing GI since this November since having residual diarrhea/loose stills since her GI bug in February 2019. Also changed from Omeprazole to Pepcid after her recent colonoscopy, due to persistent indigestion.    Gynecologic History  Menarche age: 2 Patient's last menstrual period was 08/09/2017 (exact date). Contraception: status post hysterectomy History of STI's: Denies  Last Pap: 07/2015. Results were: normal.  Denies h/o abnormal pap smears. Last mammogram: 11/03/2019. Results were: normal.   Last colonoscopy: 11/02/2019: Several benign polyps, repeat in 5 years.    OB History  Gravida Para Term Preterm AB Living  2 2 2  0 0 2  SAB TAB Ectopic Multiple Live Births  0 0 0 0 2    # Outcome Date GA Lbr Len/2nd Weight Sex Delivery Anes PTL Lv  2 Term         LIV  1 Term         LIV    Past Medical History:  Diagnosis Date  . Anemia   . Chronic kidney disease    H/O KIDNEY STONES  . Endometrial polyp 2016   hysteroscopically removed  . GERD (gastroesophageal reflux disease)   . H/O mammogram 05/11/2015  . Hematuria   . History of kidney stones   . History of uterine fibroid    s/p hysterectomy 08/2017  . HTN (hypertension)     Past Surgical History:  Procedure Laterality Date  . ABDOMINAL HYSTERECTOMY    .  CESAREAN SECTION    . COLONOSCOPY WITH PROPOFOL N/A 11/02/2019   Procedure: COLONOSCOPY WITH PROPOFOL;  Surgeon: Virgel Manifold, MD;  Location: [facility] END[facility]COPY;  Service: Endoscopy;  Laterality: N/A;  . HYSTERECTOMY ABDOMINAL WITH SALPINGECTOMY Bilateral 08/19/2017   Procedure: HYSTERECTOMY ABDOMINAL WITH BILATERAL SALPINGECTOMY;  Surgeon: [name], MD;  Location: [facility] ORS;  Service: Gynecology;  Laterality: Bilateral;  . HYSTEROSCOPY W/D&C N/A 11/14/2015   Procedure: DILATATION AND CURETTAGE /HYSTEROSCOPY;  Surgeon: [name], MD;  Location: [facility] ORS;  Service: Gynecology;  Laterality: N/A;  . KIDNEY STONE SURGERY      Family History  Problem Relation Age of Onset  . Diabetes Mellitus I Mother   . Diabetes Mellitus I Maternal Grandmother   . Prostate cancer Maternal Grandfather   . Diabetes Mellitus I Paternal Grandmother   . Breast cancer Neg Hx     Social History   Socioeconomic History  . Marital status: Married    Spouse name: [name]  . Number of children: 2  . Years of education: college  . Highest education level: Not on file  Occupational History  . Occupation: n/a    Employer: [employer]  Tobacco Use  . Smoking status: Former Smoker    Quit date: 09/24/1990    Years since quitting: 29.1  . Smokeless tobacco: Former Systems developer  Substance and Sexual Activity  . Alcohol use: Yes    Alcohol/week: 2.0 - 3.0 standard drinks    Types: 2 - 3 Standard drinks or equivalent per week    Comment: social  . Drug use: No  . Sexual activity: Yes    Birth control/protection: None  Other Topics Concern  . Not on file  Social History Narrative   Patient is married [name]) and lives at home with her husband and one child, one child is in college.   Patient is working full-time.   Patient has a college education.   Patient is right-handed.   Patient drinks 1-2 cups of coffee daily, soda and tea are occasionally, not daily.   Social Determinants of Health   Financial  Resource Strain:   . Difficulty of Paying Living Expenses: Not on file  Food Insecurity:   . Worried About Charity fundraiser in the Last Year: Not on file  . Ran Out of Food in the Last Year: Not on file  Transportation Needs:   . Lack of Transportation (Medical): Not on file  . Lack of Transportation (Non-Medical): Not on file  Physical Activity:   . Days of Exercise per Week: Not on file  . Minutes of Exercise per Session: Not on file  Stress:   . Feeling of Stress : Not on file  Social Connections:   . Frequency of Communication with Friends and Family: Not on file  . Frequency of Social Gatherings with Friends and Family: Not on file  . Attends Religious Services: Not on file  . Active Member of Clubs or Organizations: Not on file  . Attends Archivist Meetings: Not on file  . Marital Status: Not on file  Intimate Partner Violence:   . Fear of Current or Ex-Partner: Not on file  . Emotionally Abused: Not on file  . Physically Abused: Not on file  . Sexually Abused: Not on file    Current Outpatient Medications on File Prior to Visit  Medication Sig Dispense Refill  . diclofenac Sodium (VOLTAREN) 1 % GEL as needed.     Marland Kitchen econazole nitrate 1 % cream Apply once a day as needed for rash.  2  . famotidine (PEPCID) 20 MG tablet Take 1 tablet (20 mg total) by mouth 2 (two) times daily. 120 tablet 0  . losartan (COZAAR) 50 MG tablet Take 1 tablet (50 mg total) by mouth daily. 90 tablet 1  . Multiple Vitamins-Calcium (ONE-A-DAY WOMENS FORMULA PO) Take 1 tablet by mouth daily.     . naproxen (NAPROSYN) 500 MG tablet Take 500 mg by mouth 2 (two) times daily with a meal. As needed     No current facility-administered medications on file prior to visit.    Allergies  Allergen Reactions  . Pollen Extract     itchy eyes, dull headaches, runny nose  . Penicillins Rash    Has patient had a PCN reaction causing immediate rash, facial/tongue/throat swelling, SOB or  lightheadedness with hypotension: No Has patient had a PCN reaction causing severe rash involving mucus membranes or skin necrosis: No Has patient had a PCN reaction that required hospitalization: No Has patient had a PCN reaction occurring within the last 10 years: No If all of the above answers are "NO", then may proceed with Cephalosporin use.      Review of Systems Constitutional: negative for chills, fatigue, fevers and sweats Eyes: negative for irritation, redness and visual disturbance Ears, nose, mouth, throat, and  face: negative for hearing loss, nasal congestion, snoring and tinnitus Respiratory: negative for asthma, cough, sputum Cardiovascular: negative for chest pain, dyspnea, exertional chest pressure/discomfort, irregular heart beat, palpitations and syncope Gastrointestinal: negative for abdominal pain, nausea and vomiting.  Positive for change in bowel habits (see HPI, being followed by GI).  Genitourinary: negative for abnormal menstrual periods, genital lesions, sexual problems and vaginal discharge, dysuria and urinary incontinence Integument/breast: positive for breast lump (right), breast tenderness. Negative for nipple discharge Hematologic/lymphatic: negative for bleeding and easy bruising Musculoskeletal:negative for back pain and muscle weakness Neurological: negative for dizziness, headaches, vertigo and weakness Endocrine: negative for diabetic symptoms including polydipsia, polyuria and skin dryness Allergic/Immunologic: negative for hay fever and urticaria        Objective:  Blood pressure 112/67, pulse 80, height 5\' 5"  (1.651 m), weight 155 lb (70.3 kg), last menstrual period 08/09/2017. Body mass index is 25.79 kg/m.  General Appearance:    Alert, cooperative, no distress, appears stated age, overweight  Head:    Normocephalic, without obvious abnormality, atraumatic  Eyes:    PERRL, conjunctiva/corneas clear, EOM's intact, both eyes  Ears:    Normal  external ear canals, both ears  Nose:   Nares normal, septum midline, mucosa normal, no drainage or sinus tenderness  Throat:   Lips, mucosa, and tongue normal; teeth and gums normal  Neck:   Supple, symmetrical, trachea midline, no adenopathy; thyroid: no enlargement/tenderness/nodules; no carotid bruit or JVD  Back:     Symmetric, no curvature, ROM normal, no CVA tenderness  Lungs:     Clear to auscultation bilaterally, respirations unlabored  Chest Wall:    No tenderness or deformity   Heart:    Regular rate and rhythm, S1 and S2 normal, no murmur, rub or gallop  Breast Exam:    No tenderness, masses, or nipple abnormality  Abdomen:     Soft, non-tender, bowel sounds active all four quadrants, no masses, no organomegaly.    Genitalia:    Pelvic:external genitalia normal, vagina without lesions, discharge, or tenderness, rectovaginal septum  normal. Cervix normal in appearance, no cervical motion tenderness, no adnexal masses or tenderness.  Uterus normal size, shape, mobile, regular contours, nontender.  Rectal:    Normal external sphincter.  No hemorrhoids appreciated. Internal exam not done.   Extremities:   Extremities normal, atraumatic, no cyanosis or edema  Pulses:   2+ and symmetric all extremities  Skin:   Skin color, texture, turgor normal, no rashes or lesions  Lymph nodes:   Cervical, supraclavicular, and axillary nodes normal  Neurologic:   CNII-XII intact, normal strength, sensation and reflexes throughout   .  Labs:  Lab Results  Component Value Date   WBC 5.6 10/15/2019   HGB 13.7 10/15/2019   HCT 41.6 10/15/2019   MCV 87.2 10/15/2019   PLT 241 10/15/2019    Lab Results  Component Value Date   CREATININE 0.74 10/15/2019   BUN 19 10/15/2019   NA 136 10/15/2019   K 4.6 10/15/2019   CL 103 10/15/2019   CO2 26 10/15/2019    Lab Results  Component Value Date   ALT 17 10/15/2019   AST 11 10/15/2019   ALKPHOS 50 06/25/2017   BILITOT 0.7 10/15/2019    Lab  Results  Component Value Date   TSH 3.46 10/15/2019    Lipid Panel     Component Value Date/Time   CHOL 192 10/15/2019 0855   CHOL 185 03/05/2018 1023   TRIG 78 10/15/2019 0855  HDL 53 10/15/2019 0855   HDL 56 03/05/2018 1023   CHOLHDL 3.6 10/15/2019 0855   VLDL 11 10/22/2016 0001   LDLCALC 122 (H) 10/15/2019 0855     Assessment:    Healthy female exam.   Dyslipidemia Essential hypertension Right breast cyst Chronic diarrhea Indigestion  Plan:     Blood tests: None ordered.  Previously performed.  Breast self exam technique reviewed and patient encouraged to perform self-exam monthly. Contraception: status post hysterectomy. Discussed healthy lifestyle modifications. Mammogram up to date, normal. Discussed performing ultrasound to better classify right breast mass (cyst noted on mammogram that seems slightly larger since last mammogram in June). Discussed options for management including expectant (with use of OTC pain meds, warm/cold compresses for discomfort), or referral to General Surgery if drainage or removal necessary/desired.  Pap smear no longer required due to patient's history of hysterectomy.  Up to date on flu vaccine.  Chronic diarrhea and indigestion managed by GI.  Dyslidemia - mild with slightly elevated LDL.  Recommend lowering cholesterol intake. Hypertension - currently on meds. Managed by PCP.  Patient can f/u in 1 year for annual exam.    [name], MD [facility]

## 2019-11-24 NOTE — Progress Notes (Signed)
Pt is present for annual exam. Flu vaccine completed. Pt stated right breast pain x 5 days no other complaints. Last mammogram 11/03/19.

## 2019-12-01 ENCOUNTER — Ambulatory Visit (INDEPENDENT_AMBULATORY_CARE_PROVIDER_SITE_OTHER): Payer: BLUE CROSS/BLUE SHIELD | Admitting: Gastroenterology

## 2019-12-01 ENCOUNTER — Other Ambulatory Visit: Payer: Self-pay

## 2019-12-01 ENCOUNTER — Encounter: Payer: Self-pay | Admitting: Gastroenterology

## 2019-12-01 VITALS — BP 113/69 | HR 84 | Temp 98.0°F | Wt 157.5 lb

## 2019-12-01 DIAGNOSIS — R195 Other fecal abnormalities: Secondary | ICD-10-CM

## 2019-12-01 MED ORDER — METAMUCIL SMOOTH TEXTURE 58.6 % PO POWD: 1.0000 | Freq: Every day | ORAL | 1 refills | Status: DC

## 2019-12-01 NOTE — Patient Instructions (Signed)

## 2019-12-02 NOTE — Progress Notes (Signed)
Vonda Antigua, MD [address]  [place]  [place], [place] [address]  Main: [phone]  Fax: [phone]   Primary Care Physician: Olin Hauser, DO   Chief Complaint  Patient presents with  . Diarrhea    Patient states she is still having some diarrhea. She states she goes from diarrhea to constipation.     HPI: Sheryl Carr is a 48 y.o. female here for follow-up of loose stools.  Has days with constipation followed by days of diarrhea.  Was previously on omeprazole which was discontinued and she noticed a difference in that she had less diarrhea after stopping it, and is currently only on H2 RA.  Symptoms of reflux are infrequent, about once or twice a week  Colonoscopy with 3 subcentimeter polyps removed, hemorrhoids seen.  Random biopsies did not show microscopic colitis.  Polyps were hyperplastic.  Current Outpatient Medications  Medication Sig Dispense Refill  . diclofenac Sodium (VOLTAREN) 1 % GEL as needed.     Marland Kitchen econazole nitrate 1 % cream Apply once a day as needed for rash.  2  . losartan (COZAAR) 50 MG tablet Take 1 tablet (50 mg total) by mouth daily. 90 tablet 1  . Multiple Vitamins-Calcium (ONE-A-DAY WOMENS FORMULA PO) Take 1 tablet by mouth daily.     . naproxen (NAPROSYN) 500 MG tablet Take 500 mg by mouth 2 (two) times daily with a meal. As needed    . psyllium (METAMUCIL SMOOTH TEXTURE) 58.6 % powder Take 1 packet by mouth daily. 283 g 1   No current facility-administered medications for this visit.    Allergies as of 12/01/2019 - Review Complete 12/01/2019  Allergen Reaction Noted  . Pollen extract  05/23/2015  . Penicillins Rash 05/23/2015    ROS:  General: Negative for anorexia, weight loss, fever, chills, fatigue, weakness. ENT: Negative for hoarseness, difficulty swallowing , nasal congestion. CV: Negative for chest pain, angina, palpitations, dyspnea on exertion, peripheral edema.  Respiratory: Negative for  dyspnea at rest, dyspnea on exertion, cough, sputum, wheezing.  GI: See history of present illness. GU:  Negative for dysuria, hematuria, urinary incontinence, urinary frequency, nocturnal urination.  Endo: Negative for unusual weight change.    Physical Examination:   BP 113/69 (BP Location: Left Arm, Patient Position: Sitting, Cuff Size: Normal)   Pulse 84   Temp 98 F (36.7 C) (Oral)   Wt 157 lb 8 oz (71.4 kg)   LMP 08/09/2017 (Exact Date)   BMI 26.21 kg/m   General: Well-nourished, well-developed in no acute distress.  Eyes: No icterus. Conjunctivae pink. Mouth: Oropharyngeal mucosa moist and pink , no lesions erythema or exudate. Neck: Supple, Trachea midline Abdomen: Bowel sounds are normal, nontender, nondistended, no hepatosplenomegaly or masses, no abdominal bruits or hernia , no rebound or guarding.   Extremities: No lower extremity edema. No clubbing or deformities. Neuro: Alert and oriented x 3.  Grossly intact. Skin: Warm and dry, no jaundice.   Psych: Alert and cooperative, normal mood and affect.   Labs: CMP     Component Value Date/Time   NA 136 10/15/2019 0855   NA 140 08/08/2015 1604   NA 139 09/07/2013 2310   K 4.6 10/15/2019 0855   K 4.1 09/07/2013 2310   CL 103 10/15/2019 0855   CL 107 09/07/2013 2310   CO2 26 10/15/2019 0855   CO2 26 09/07/2013 2310   GLUCOSE 90 10/15/2019 0855   GLUCOSE 95 09/07/2013 2310   BUN 19 10/15/2019  0855   BUN 19 09/08/2018 0910   BUN 25 (H) 09/07/2013 2310   CREATININE 0.74 10/15/2019 0855   CALCIUM 9.3 10/15/2019 0855   CALCIUM 9.1 09/07/2013 2310   PROT 6.9 10/15/2019 0855   PROT 6.9 08/08/2015 1604   PROT 7.1 09/07/2013 2310   ALBUMIN 4.0 06/25/2017 2016   ALBUMIN 4.5 08/08/2015 1604   ALBUMIN 3.8 09/07/2013 2310   AST 11 10/15/2019 0855   AST 23 09/07/2013 2310   ALT 17 10/15/2019 0855   ALT 31 09/07/2013 2310   ALKPHOS 50 06/25/2017 2016   ALKPHOS 69 09/07/2013 2310   BILITOT 0.7 10/15/2019 0855    BILITOT 0.4 08/08/2015 1604   BILITOT 0.3 09/07/2013 2310   GFRNONAA 96 10/15/2019 0855   GFRAA 112 10/15/2019 0855   Lab Results  Component Value Date   WBC 5.6 10/15/2019   HGB 13.7 10/15/2019   HCT 41.6 10/15/2019   MCV 87.2 10/15/2019   PLT 241 10/15/2019    Imaging Studies: MM DIAG BREAST TOMO BILATERAL  Result Date: 11/03/2019 CLINICAL DATA:  Six-month follow-up for likely benign calcifications in the right breast. EXAM: DIGITAL DIAGNOSTIC BILATERAL MAMMOGRAM WITH CAD AND TOMO COMPARISON:  Previous exam(s). ACR Breast Density Category c: The breast tissue is heterogeneously dense, which may obscure small masses. FINDINGS: Bilateral scattered calcifications are seen throughout both breasts. Many of these calcifications layer on the true lateral view, which is consistent with benign milk of calcium. The previously documented cyst in the lateral right breast appears slightly larger, but is benign. No suspicious calcifications, masses or areas of distortion are seen in the bilateral breasts. Mammographic images were processed with CAD. IMPRESSION: 1. The calcifications scattered through both breasts are consistent with benign milk of calcium. No suspicious groups of calcifications are identified. 2.  No mammographic evidence of malignancy in the bilateral breasts. RECOMMENDATION: Screening mammogram in one year.(Code:SM-B-01Y) I have discussed the findings and recommendations with the patient. If applicable, a reminder letter will be sent to the patient regarding the next appointment. BI-RADS CATEGORY  2: Benign. Electronically Signed   By: Ammie Ferrier M.D.   On: 11/03/2019 11:16    Assessment and Plan:   Sheryl Carr is a 48 y.o. y/o female with symptoms of constipation followed by loose stools  Patient willing to try Metamucil to help bulk her stool, which will also help with her constipation days  Patient willing to stop Pepcid and this may help with the loose stools she  is having as well  Patient educated extensively on acid reflux lifestyle modification, including buying a bed wedge, not eating 3 hrs before bedtime, diet modifications, and handout given for the same.   No alarm symptoms present  If reflux symptoms get worse after stopping Pepcid, she was asked to notify us   Dr Vonda Antigua

## 2019-12-09 ENCOUNTER — Telehealth: Payer: Self-pay | Admitting: Obstetrics and Gynecology

## 2019-12-09 DIAGNOSIS — Z1231 Encounter for screening mammogram for malignant neoplasm of breast: Secondary | ICD-10-CM

## 2019-12-09 NOTE — Telephone Encounter (Signed)
Pt called in and stated that she needs order a u/s of her mammogram sent over for the insurance to cover it.  Please advise

## 2019-12-10 NOTE — Telephone Encounter (Signed)
Pt is aware that her mammogram ordered had be placed.

## 2019-12-14 ENCOUNTER — Telehealth: Payer: Self-pay | Admitting: Obstetrics and Gynecology

## 2019-12-14 NOTE — Telephone Encounter (Signed)
Sheryl Carr for [facility] breast sent over a u/s that needs to be signed off on please

## 2019-12-15 NOTE — Telephone Encounter (Signed)
Order has been signed.

## 2019-12-16 ENCOUNTER — Ambulatory Visit
Admission: RE | Admit: 2019-12-16 | Discharge: 2019-12-16 | Disposition: A | Discharge status: Home / Self Care | Payer: BLUE CROSS/BLUE SHIELD | Source: Ambulatory Visit | Attending: Obstetrics and Gynecology | Admitting: Obstetrics and Gynecology

## 2019-12-16 DIAGNOSIS — N6001 Solitary cyst of right breast: Secondary | ICD-10-CM | POA: Diagnosis not present

## 2019-12-17 ENCOUNTER — Other Ambulatory Visit: Payer: Self-pay | Admitting: Obstetrics and Gynecology

## 2019-12-17 DIAGNOSIS — R928 Other abnormal and inconclusive findings on diagnostic imaging of breast: Secondary | ICD-10-CM

## 2019-12-17 DIAGNOSIS — N6001 Solitary cyst of right breast: Secondary | ICD-10-CM

## 2019-12-24 ENCOUNTER — Ambulatory Visit
Admission: RE | Admit: 2019-12-24 | Discharge: 2019-12-24 | Disposition: A | Discharge status: Home / Self Care | Payer: BLUE CROSS/BLUE SHIELD | Source: Ambulatory Visit | Attending: Obstetrics and Gynecology | Admitting: Obstetrics and Gynecology

## 2019-12-24 DIAGNOSIS — N6001 Solitary cyst of right breast: Secondary | ICD-10-CM | POA: Diagnosis not present

## 2019-12-24 DIAGNOSIS — R928 Other abnormal and inconclusive findings on diagnostic imaging of breast: Secondary | ICD-10-CM

## 2019-12-24 HISTORY — PX: BREAST CYST ASPIRATION: SHX578

## 2019-12-27 ENCOUNTER — Other Ambulatory Visit: Payer: Self-pay | Admitting: Family Medicine

## 2019-12-27 DIAGNOSIS — I10 Essential (primary) hypertension: Secondary | ICD-10-CM

## 2020-02-01 DIAGNOSIS — D225 Melanocytic nevi of trunk: Secondary | ICD-10-CM | POA: Diagnosis not present

## 2020-02-01 DIAGNOSIS — D224 Melanocytic nevi of scalp and neck: Secondary | ICD-10-CM | POA: Diagnosis not present

## 2020-02-01 DIAGNOSIS — Z1283 Encounter for screening for malignant neoplasm of skin: Secondary | ICD-10-CM | POA: Diagnosis not present

## 2020-02-01 DIAGNOSIS — D1801 Hemangioma of skin and subcutaneous tissue: Secondary | ICD-10-CM | POA: Diagnosis not present

## 2020-07-20 ENCOUNTER — Other Ambulatory Visit: Payer: Self-pay | Admitting: Family Medicine

## 2020-07-20 DIAGNOSIS — I10 Essential (primary) hypertension: Secondary | ICD-10-CM

## 2020-07-20 NOTE — Telephone Encounter (Signed)
Courtesy refill given until appt on 10/19/20.

## 2020-08-11 ENCOUNTER — Encounter: Payer: Self-pay | Admitting: Obstetrics and Gynecology

## 2020-08-11 ENCOUNTER — Other Ambulatory Visit: Payer: Self-pay

## 2020-08-11 ENCOUNTER — Ambulatory Visit: Payer: BLUE CROSS/BLUE SHIELD | Admitting: Obstetrics and Gynecology

## 2020-08-11 VITALS — BP 126/86 | HR 73 | Ht 69.0 in | Wt 155.9 lb

## 2020-08-11 DIAGNOSIS — N6313 Unspecified lump in the right breast, lower outer quadrant: Secondary | ICD-10-CM | POA: Diagnosis not present

## 2020-08-11 NOTE — Progress Notes (Signed)
   GYNECOLOGY CLINIC PROGRESS NOTE  Subjective:     Sheryl Carr is an 49 y.o. G16P2002 female who presents for evaluation of a breast mass. Change was noted beginning in July, and has been unchanged since first identified. Patient does routinely do self breast exams. The mass does not change during menstrual cycle (is s/p hysterectomy). The mass is tender. Patient denies nipple discharge. Breast cancer risk factors include: None..  Patient has had issues with right breast previously, had a breast cyst that was aspirated in January of this year.   The following portions of the patient's history were reviewed and updated as appropriate: allergies, current medications, past family history, past medical history, past social history, past surgical history and problem list.   Review of Systems A comprehensive review of systems was negative except for: Integument/breast: positive for breast lump (see HPI)    Objective:    BP 126/86   Pulse 73   Ht 5\' 9"  (1.753 m)   Wt 155 lb 14.4 oz (70.7 kg)   LMP 08/09/2017 (Exact Date)   BMI 23.02 kg/m  General appearance: alert and no distress Breasts: Normal appearance. No nipple retraction or dimpling, No nipple discharge or bleeding, positive findings: rubbery, mobile and non-tender nodule located on the right lower outer quadrant (approximately 7 o'clock region, 2-3 cm below areola)   Assessment:    breast mass, likely benign    Plan:    Reassured the patient that the finding is most likely benign. Feels more like fibrous breast tissue. Advised to continue to monitor mass. If noting changes over next several weeks, can order diagnostic mammogram/ultrasound. If unchanged, patient can hold off until regularly scheduled mammogram in November.    [name], MD [facility]

## 2020-08-11 NOTE — Progress Notes (Signed)
Pt present for lump on right breast. Pt stated that she noticed the lump in July 2021, the size and location of the lump has not changed but the area is tender to the touch.

## 2020-08-14 ENCOUNTER — Encounter: Payer: Self-pay | Admitting: Obstetrics and Gynecology

## 2020-09-07 ENCOUNTER — Other Ambulatory Visit: Payer: Self-pay | Admitting: Obstetrics and Gynecology

## 2020-09-07 DIAGNOSIS — Z1231 Encounter for screening mammogram for malignant neoplasm of breast: Secondary | ICD-10-CM

## 2020-10-18 ENCOUNTER — Telehealth: Payer: Self-pay | Admitting: Family Medicine

## 2020-10-18 DIAGNOSIS — D500 Iron deficiency anemia secondary to blood loss (chronic): Secondary | ICD-10-CM

## 2020-10-18 DIAGNOSIS — E7800 Pure hypercholesterolemia, unspecified: Secondary | ICD-10-CM

## 2020-10-18 DIAGNOSIS — I10 Essential (primary) hypertension: Secondary | ICD-10-CM

## 2020-10-18 DIAGNOSIS — Z0000 Encounter for general adult medical examination without abnormal findings: Secondary | ICD-10-CM

## 2020-10-18 DIAGNOSIS — R7309 Other abnormal glucose: Secondary | ICD-10-CM

## 2020-10-18 DIAGNOSIS — Z1159 Encounter for screening for other viral diseases: Secondary | ICD-10-CM

## 2020-10-19 ENCOUNTER — Other Ambulatory Visit: Payer: Self-pay | Admitting: Family Medicine

## 2020-10-19 ENCOUNTER — Other Ambulatory Visit: Payer: BLUE CROSS/BLUE SHIELD

## 2020-10-19 DIAGNOSIS — E7800 Pure hypercholesterolemia, unspecified: Secondary | ICD-10-CM | POA: Diagnosis not present

## 2020-10-19 DIAGNOSIS — R7309 Other abnormal glucose: Secondary | ICD-10-CM | POA: Diagnosis not present

## 2020-10-19 DIAGNOSIS — Z1159 Encounter for screening for other viral diseases: Secondary | ICD-10-CM | POA: Diagnosis not present

## 2020-10-19 DIAGNOSIS — I10 Essential (primary) hypertension: Secondary | ICD-10-CM

## 2020-10-19 NOTE — Telephone Encounter (Signed)
Labs ordered  Nobie Putnam, [facility] 10/19/2020, 7:25 AM

## 2020-10-20 LAB — CBC WITH DIFFERENTIAL/PLATELET
Absolute Monocytes: 302 cells/uL (ref 200–950)
Basophils Absolute: 41 cells/uL (ref 0–200)
Basophils Relative: 0.9 %
Eosinophils Absolute: 90 cells/uL (ref 15–500)
Eosinophils Relative: 2 %
HCT: 41.9 % (ref 35.0–45.0)
Hemoglobin: 13.8 g/dL (ref 11.7–15.5)
Lymphs Abs: 1161 cells/uL (ref 850–3900)
MCH: 29.5 pg (ref 27.0–33.0)
MCHC: 32.9 g/dL (ref 32.0–36.0)
MCV: 89.5 fL (ref 80.0–100.0)
MPV: 10.3 fL (ref 7.5–12.5)
Monocytes Relative: 6.7 %
Neutro Abs: 2907 cells/uL (ref 1500–7800)
Neutrophils Relative %: 64.6 %
Platelets: 236 10*3/uL (ref 140–400)
RBC: 4.68 10*6/uL (ref 3.80–5.10)
RDW: 11.6 % (ref 11.0–15.0)
Total Lymphocyte: 25.8 %
WBC: 4.5 10*3/uL (ref 3.8–10.8)

## 2020-10-20 LAB — TSH: TSH: 3.1 mIU/L

## 2020-10-20 LAB — COMPLETE METABOLIC PANEL WITH GFR
AG Ratio: 1.8 (calc) (ref 1.0–2.5)
ALT: 20 U/L (ref 6–29)
AST: 14 U/L (ref 10–35)
Albumin: 4.4 g/dL (ref 3.6–5.1)
Alkaline phosphatase (APISO): 56 U/L (ref 31–125)
BUN: 19 mg/dL (ref 7–25)
CO2: 30 mmol/L (ref 20–32)
Calcium: 9.4 mg/dL (ref 8.6–10.2)
Chloride: 103 mmol/L (ref 98–110)
Creat: 0.79 mg/dL (ref 0.50–1.10)
GFR, Est African American: 103 mL/min/{1.73_m2} (ref >=60)
GFR, Est Non African American: 89 mL/min/{1.73_m2} (ref >=60)
Globulin: 2.4 g/dL (calc) (ref 1.9–3.7)
Glucose, Bld: 96 mg/dL (ref 65–99)
Potassium: 4.5 mmol/L (ref 3.5–5.3)
Sodium: 138 mmol/L (ref 135–146)
Total Bilirubin: 0.7 mg/dL (ref 0.2–1.2)
Total Protein: 6.8 g/dL (ref 6.1–8.1)

## 2020-10-20 LAB — HEPATITIS C ANTIBODY
Hepatitis C Ab: NONREACTIVE
SIGNAL TO CUT-OFF: 0.01 (ref <1.00)

## 2020-10-20 LAB — HEMOGLOBIN A1C
Hgb A1c MFr Bld: 4.9 % of total Hgb (ref <5.7)
Mean Plasma Glucose: 94 (calc)
eAG (mmol/L): 5.2 (calc)

## 2020-10-20 LAB — LIPID PANEL
Cholesterol: 193 mg/dL (ref <200)
HDL: 58 mg/dL (ref >=50)
LDL Cholesterol (Calc): 115 mg/dL (calc) — ABNORMAL HIGH
Non-HDL Cholesterol (Calc): 135 mg/dL (calc) — ABNORMAL HIGH (ref <130)
Total CHOL/HDL Ratio: 3.3 (calc) (ref <5.0)
Triglycerides: 98 mg/dL (ref <150)

## 2020-10-24 ENCOUNTER — Encounter: Payer: Self-pay | Admitting: Family Medicine

## 2020-10-24 ENCOUNTER — Ambulatory Visit (INDEPENDENT_AMBULATORY_CARE_PROVIDER_SITE_OTHER): Payer: BLUE CROSS/BLUE SHIELD | Admitting: Family Medicine

## 2020-10-24 ENCOUNTER — Other Ambulatory Visit: Payer: Self-pay | Admitting: Family Medicine

## 2020-10-24 ENCOUNTER — Other Ambulatory Visit: Payer: Self-pay

## 2020-10-24 VITALS — BP 122/87 | HR 72 | Temp 98.4°F | Resp 16 | Ht 67.0 in | Wt 157.0 lb

## 2020-10-24 DIAGNOSIS — R7309 Other abnormal glucose: Secondary | ICD-10-CM

## 2020-10-24 DIAGNOSIS — I10 Essential (primary) hypertension: Secondary | ICD-10-CM

## 2020-10-24 DIAGNOSIS — Z0000 Encounter for general adult medical examination without abnormal findings: Secondary | ICD-10-CM

## 2020-10-24 DIAGNOSIS — E7800 Pure hypercholesterolemia, unspecified: Secondary | ICD-10-CM

## 2020-10-24 DIAGNOSIS — Z23 Encounter for immunization: Secondary | ICD-10-CM

## 2020-10-24 MED ORDER — LOSARTAN POTASSIUM 50 MG PO TABS: 50.0000 mg | ORAL_TABLET | Freq: Every day | ORAL | 3 refills | Status: DC

## 2020-10-24 NOTE — Patient Instructions (Addendum)
Thank you for coming to the office today.  Use Voltaren Diclofenac OTC gel as needed 1-4 times a day as needed for pain.  COVID Vaccine Information  You can get booster dose at [facility], [facility] - check their websites or call to schedule in advance. Appointment is required.  [facility] Vaccine Information  [email or web address]  Appointments are required. To register for your free vaccination appointment, click the link on the date on the calendar located on the website or call [phone].  DUE for FASTING BLOOD WORK (no food or drink after midnight before the lab appointment, only water or coffee without cream/sugar on the morning of)  SCHEDULE "Lab Only" visit in the morning at the clinic for lab draw in 1 YEAR  - Make sure Lab Only appointment is at about 1 week before your next appointment, so that results will be available  For Lab Results, once available within 2-3 days of blood draw, you can can log in to MyChart online to view your results and a brief explanation. Also, we can discuss results at next follow-up visit.    Please schedule a Follow-up Appointment to: Return in about 1 year (around 10/24/2021) for Annual Physical.  If you have any other questions or concerns, please feel free to call the office or send a message through [place]. You may also schedule an earlier appointment if necessary.  Additionally, you may be receiving a survey about your experience at our office within a few days to 1 week by e-mail or mail. We value your feedback.  Nobie Putnam, DO [place]

## 2020-10-24 NOTE — Progress Notes (Signed)
Subjective:    Patient ID: Sheryl Carr, female    DOB: Jul 17, 1971, 49 y.o.   MRN: [id number]  Sheryl Carr is a 49 y.o. female presenting on 10/24/2020 for Annual Exam   HPI   Here for Annual Physical and lab review.  Wellness / Lifestyle BMI >24 / Elevated LDL Admits weight fluctuation in past 1 year, diet changes. Now weight stable 157 lbs See below diet - gluten free Exercise limited - but she did 3 months. She has goal to improve activity Last lab showed lipids LDL 115 similar to prior 108 to 122 and improved HDL, otherwise normal results. Not on cholesterol medicine and has not needed this. No other significant cholesterol history.  CHRONIC HTN: Reports no new concerns. Current Meds - Losartan 50mg  daily   Reports good compliance, took meds today. Tolerating well, w/o complaints. Denies CP, dyspnea, HA, edema, dizziness / lightheadedness   Follow-up GI / Diarrhea Followed by [facility] - has adjusted her medication, off PPI, limited oral NSAID She has completed Colonoscopy 11/02/2019 - had a few polyps, internal hemorrhoids without bleeding. She did a food sensitivity test OTC in interval - other "gluten" sensitivity She switchedto gluten free diet in spring, then she switched back to regular diet, gained a few lbs back and had lost some.   Additional complaint:  History of R Thumb Arthritis X-rays, given rx Naproxen PRN, had seen [facility] specialist. Has not had issues since. Now recently cold weather had some issue with some pain and stiff.   Health Maintenance:  Next annual mammogram coming up, had prior bi-rads 3 that was negative 05/2019  Due for Flu Shot, will receive today  COVID Vaccine status - Home 02/18/20 and 03/10/20. Considering booster   Depression screen [facility] 2/9 10/24/2020 09/03/2019 10/21/2018  Decreased Interest 0 0 0  Down, Depressed, Hopeless 0 0 0  PHQ - 2 Score 0 0 0    Past Medical History:  Diagnosis Date  . Anemia     . Chronic kidney disease    H/O KIDNEY STONES  . Endometrial polyp 2016   hysteroscopically removed  . GERD (gastroesophageal reflux disease)   . H/O mammogram 05/11/2015  . Hematuria   . History of kidney stones   . History of uterine fibroid    s/p hysterectomy 08/2017  . HTN (hypertension)    PT WAS TAKEN OFF HER BP MED IN MAY/JUNE OF 2016 BUT BP IS ELEVATED AGAIN   Past Surgical History:  Procedure Laterality Date  . ABDOMINAL HYSTERECTOMY    . CESAREAN SECTION    . COLONOSCOPY WITH PROPOFOL N/A 11/02/2019   Procedure: COLONOSCOPY WITH PROPOFOL;  Surgeon: Virgel Manifold, MD;  Location: [facility] ENDOSCOPY;  Service: Endoscopy;  Laterality: N/A;  . HYSTERECTOMY ABDOMINAL WITH SALPINGECTOMY Bilateral 08/19/2017   Procedure: HYSTERECTOMY ABDOMINAL WITH BILATERAL SALPINGECTOMY;  Surgeon: [name], MD;  Location: [facility] ORS;  Service: Gynecology;  Laterality: Bilateral;  . HYSTEROSCOPY WITH D & C N/A 11/14/2015   Procedure: DILATATION AND CURETTAGE /HYSTEROSCOPY;  Surgeon: [name], MD;  Location: [facility] ORS;  Service: Gynecology;  Laterality: N/A;  . KIDNEY STONE SURGERY     Social History   Socioeconomic History  . Marital status: Married    Spouse name: [name]  . Number of children: 2  . Years of education: college  . Highest education level: Not on file  Occupational History  . Occupation: n/a    Employer: [employer]  Tobacco Use  . Smoking  status: Former Smoker    Quit date: 09/24/1990    Years since quitting: 30.1  . Smokeless tobacco: Former Network engineer  . Vaping Use: Never used  Substance and Sexual Activity  . Alcohol use: Yes    Alcohol/week: 2.0 - 3.0 standard drinks    Types: 2 - 3 Standard drinks or equivalent per week    Comment: social  . Drug use: No  . Sexual activity: Yes    Birth control/protection: None  Other Topics Concern  . Not on file  Social History Narrative   Patient is married [name]) and lives at home with her husband and  one child, one child is in college.   Patient is working full-time.   Patient has a college education.   Patient is right-handed.   Patient drinks 1-2 cups of coffee daily, soda and tea are occasionally, not daily.   Social Determinants of Health   Financial Resource Strain:   . Difficulty of Paying Living Expenses: Not on file  Food Insecurity:   . Worried About Charity fundraiser in the Last Year: Not on file  . Ran Out of Food in the Last Year: Not on file  Transportation Needs:   . Lack of Transportation (Medical): Not on file  . Lack of Transportation (Non-Medical): Not on file  Physical Activity:   . Days of Exercise per Week: Not on file  . Minutes of Exercise per Session: Not on file  Stress:   . Feeling of Stress : Not on file  Social Connections:   . Frequency of Communication with Friends and Family: Not on file  . Frequency of Social Gatherings with Friends and Family: Not on file  . Attends Religious Services: Not on file  . Active Member of Clubs or Organizations: Not on file  . Attends Archivist Meetings: Not on file  . Marital Status: Not on file  Intimate Partner Violence:   . Fear of Current or Ex-Partner: Not on file  . Emotionally Abused: Not on file  . Physically Abused: Not on file  . Sexually Abused: Not on file   Family History  Problem Relation Age of Onset  . Diabetes Mellitus I Mother   . Diabetes Mellitus I Maternal Grandmother   . Prostate cancer Maternal Grandfather   . Diabetes Mellitus I Paternal Grandmother   . Breast cancer Neg Hx    Current Outpatient Medications on File Prior to Visit  Medication Sig  . econazole nitrate 1 % cream Apply once a day as needed for rash.  . Multiple Vitamins-Calcium (ONE-A-DAY WOMENS FORMULA PO) Take 1 tablet by mouth daily.   . psyllium (METAMUCIL SMOOTH TEXTURE) 58.6 % powder Take 1 packet by mouth daily. (Patient taking differently: Take 1 packet by mouth daily. As needed)   No current  facility-administered medications on file prior to visit.    Review of Systems  Constitutional: Negative for activity change, appetite change, chills, diaphoresis, fatigue and fever.  HENT: Negative for congestion and hearing loss.   Eyes: Negative for visual disturbance.  Respiratory: Negative for apnea, cough, chest tightness, shortness of breath and wheezing.   Cardiovascular: Negative for chest pain, palpitations and leg swelling.  Gastrointestinal: Negative for abdominal pain, constipation, diarrhea, nausea and vomiting.  Endocrine: Negative for cold intolerance.  Genitourinary: Negative for difficulty urinating, dysuria, frequency and hematuria.  Musculoskeletal: Negative for arthralgias and neck pain.  Skin: Negative for rash.  Allergic/Immunologic: Negative for environmental allergies.  Neurological:  Negative for dizziness, weakness, light-headedness, numbness and headaches.  Hematological: Negative for adenopathy.  Psychiatric/Behavioral: Negative for behavioral problems, dysphoric mood and sleep disturbance. The patient is not nervous/anxious.    Per HPI unless specifically indicated above     Objective:    BP 122/87   Pulse 72   Temp 98.4 F (36.9 C) (Temporal)   Resp 16   Ht 5\' 7"  (1.702 m)   Wt 157 lb (71.2 kg)   LMP 08/09/2017 (Exact Date)   SpO2 100%   BMI 24.59 kg/m   Wt Readings from Last 3 Encounters:  10/24/20 157 lb (71.2 kg)  08/11/20 155 lb 14.4 oz (70.7 kg)  12/01/19 157 lb 8 oz (71.4 kg)    Physical Exam Vitals and nursing note reviewed.  Constitutional:      General: She is not in acute distress.    Appearance: She is well-developed. She is not diaphoretic.     Comments: Well-appearing, comfortable, cooperative  HENT:     Head: Normocephalic and atraumatic.  Eyes:     General:        Right eye: No discharge.        Left eye: No discharge.     Conjunctiva/sclera: Conjunctivae normal.     Pupils: Pupils are equal, round, and reactive to  light.  Neck:     Thyroid: No thyromegaly.     Vascular: No carotid bruit.  Cardiovascular:     Rate and Rhythm: Normal rate and regular rhythm.     Heart sounds: Normal heart sounds. No murmur heard.   Pulmonary:     Effort: Pulmonary effort is normal. No respiratory distress.     Breath sounds: Normal breath sounds. No wheezing or rales.  Abdominal:     General: Bowel sounds are normal. There is no distension.     Palpations: Abdomen is soft. There is no mass.     Tenderness: There is no abdominal tenderness.  Musculoskeletal:        General: No tenderness. Normal range of motion.     Cervical back: Normal range of motion and neck supple.     Right lower leg: No edema.     Left lower leg: No edema.     Comments: Upper / Lower Extremities: - Normal muscle tone, strength bilateral upper extremities 5/5, lower extremities 5/5  Lymphadenopathy:     Cervical: No cervical adenopathy.  Skin:    General: Skin is warm and dry.     Findings: No erythema or rash.  Neurological:     Mental Status: She is alert and oriented to person, place, and time.     Comments: Distal sensation intact to light touch all extremities  Psychiatric:        Behavior: Behavior normal.     Comments: Well groomed, good eye contact, normal speech and thoughts       Results for orders placed or performed in visit on 10/18/20  COMPLETE METABOLIC PANEL WITH GFR  Result Value Ref Range   Glucose, Bld 96 65 - 99 mg/dL   BUN 19 7 - 25 mg/dL   Creat 0.79 0.50 - 1.10 mg/dL   GFR, Est Non African American 89 > OR = 60 mL/min/1.60m2   GFR, Est African American 103 > OR = 60 mL/min/1.7m2   BUN/Creatinine Ratio NOT APPLICABLE 6 - 22 (calc)   Sodium 138 135 - 146 mmol/L   Potassium 4.5 3.5 - 5.3 mmol/L   Chloride 103 98 - 110 mmol/L  CO2 30 20 - 32 mmol/L   Calcium 9.4 8.6 - 10.2 mg/dL   Total Protein 6.8 6.1 - 8.1 g/dL   Albumin 4.4 3.6 - 5.1 g/dL   Globulin 2.4 1.9 - 3.7 g/dL (calc)   AG Ratio 1.8 1.0 -  2.5 (calc)   Total Bilirubin 0.7 0.2 - 1.2 mg/dL   Alkaline phosphatase (APISO) 56 31 - 125 U/L   AST 14 10 - 35 U/L   ALT 20 6 - 29 U/L  CBC with Differential/Platelet  Result Value Ref Range   WBC 4.5 3.8 - 10.8 Thousand/uL   RBC 4.68 3.80 - 5.10 Million/uL   Hemoglobin 13.8 11.7 - 15.5 g/dL   HCT 41.9 35 - 45 %   MCV 89.5 80.0 - 100.0 fL   MCH 29.5 27.0 - 33.0 pg   MCHC 32.9 32.0 - 36.0 g/dL   RDW 11.6 11.0 - 15.0 %   Platelets 236 140 - 400 Thousand/uL   MPV 10.3 7.5 - 12.5 fL   Neutro Abs 2,907 1,500 - 7,800 cells/uL   Lymphs Abs 1,161 850 - 3,900 cells/uL   Absolute Monocytes 302 200 - 950 cells/uL   Eosinophils Absolute 90 15.0 - 500.0 cells/uL   Basophils Absolute 41 0.0 - 200.0 cells/uL   Neutrophils Relative % 64.6 %   Total Lymphocyte 25.8 %   Monocytes Relative 6.7 %   Eosinophils Relative 2.0 %   Basophils Relative 0.9 %  Hemoglobin A1c  Result Value Ref Range   Hgb A1c MFr Bld 4.9 <5.7 % of total Hgb   Mean Plasma Glucose 94 (calc)   eAG (mmol/L) 5.2 (calc)  Lipid panel  Result Value Ref Range   Cholesterol 193 <200 mg/dL   HDL 58 > OR = 50 mg/dL   Triglycerides 98 <150 mg/dL   LDL Cholesterol (Calc) 115 (H) mg/dL (calc)   Total CHOL/HDL Ratio 3.3 <5.0 (calc)   Non-HDL Cholesterol (Calc) 135 (H) <130 mg/dL (calc)  TSH  Result Value Ref Range   TSH 3.10 mIU/L  Hepatitis C antibody  Result Value Ref Range   Hepatitis C Ab NON-REACTIVE NON-REACTI   SIGNAL TO CUT-OFF 0.01 <1.00      Assessment & Plan:   Problem List Items Addressed This Visit    Essential hypertension    Well-controlled HTN - Home BP readings normal No known complications  Plan:  1. Continue current BP regimen - Losartan 50mg  daily  2. Encourage improved lifestyle - low sodium diet, improve regular exercise 3. Continue monitor BP outside office, bring readings to next visit, if persistently >140/90 or new symptoms notify office sooner      Relevant Medications   losartan  (COZAAR) 50 MG tablet    Other Visit Diagnoses    Annual physical exam    -  Primary   Needs flu shot       Relevant Orders   Flu Vaccine QUAD 6+ mos PF IM (Fluarix Quad PF) (Completed)      Updated Health Maintenance information - Discussed COVID booster dose for future when ready we can check antibody level if interested Reviewed recent lab results with patient Encouraged improvement to lifestyle with diet and exercise Reviewed goal for considering gluten free diet for period of time if interested - Goal of weight loss   Meds ordered this encounter  Medications  . losartan (COZAAR) 50 MG tablet    Sig: Take 1 tablet (50 mg total) by mouth daily.    Dispense:  90 tablet    Refill:  3     Follow up plan: Return in about 1 year (around 10/24/2021) for Annual Physical.  Future lab orders 10/23/21  Nobie Putnam, [facility] 10/24/2020, 11:14 AM

## 2020-10-24 NOTE — Assessment & Plan Note (Signed)
Well-controlled HTN - Home BP readings normal No known complications  Plan:  1. Continue current BP regimen - Losartan 50mg  daily  2. Encourage improved lifestyle - low sodium diet, improve regular exercise 3. Continue monitor BP outside office, bring readings to next visit, if persistently >140/90 or new symptoms notify office sooner

## 2020-10-27 ENCOUNTER — Other Ambulatory Visit: Payer: Self-pay

## 2020-11-07 ENCOUNTER — Ambulatory Visit
Admission: RE | Admit: 2020-11-07 | Discharge: 2020-11-07 | Disposition: A | Discharge status: Home / Self Care | Payer: BLUE CROSS/BLUE SHIELD | Source: Ambulatory Visit | Attending: Obstetrics and Gynecology | Admitting: Obstetrics and Gynecology

## 2020-11-07 ENCOUNTER — Other Ambulatory Visit: Payer: Self-pay

## 2020-11-07 ENCOUNTER — Other Ambulatory Visit: Payer: Self-pay | Admitting: Obstetrics and Gynecology

## 2020-11-07 DIAGNOSIS — Z1231 Encounter for screening mammogram for malignant neoplasm of breast: Secondary | ICD-10-CM | POA: Insufficient documentation

## 2020-11-07 DIAGNOSIS — N6310 Unspecified lump in the right breast, unspecified quadrant: Secondary | ICD-10-CM

## 2020-11-15 ENCOUNTER — Other Ambulatory Visit: Payer: Self-pay

## 2020-11-15 ENCOUNTER — Ambulatory Visit
Admission: RE | Admit: 2020-11-15 | Discharge: 2020-11-15 | Disposition: A | Discharge status: Home / Self Care | Payer: BLUE CROSS/BLUE SHIELD | Source: Ambulatory Visit | Attending: Obstetrics and Gynecology | Admitting: Obstetrics and Gynecology

## 2020-11-15 DIAGNOSIS — N6310 Unspecified lump in the right breast, unspecified quadrant: Secondary | ICD-10-CM | POA: Diagnosis not present

## 2020-11-15 DIAGNOSIS — R922 Inconclusive mammogram: Secondary | ICD-10-CM | POA: Diagnosis not present

## 2020-11-15 DIAGNOSIS — Z1231 Encounter for screening mammogram for malignant neoplasm of breast: Secondary | ICD-10-CM | POA: Diagnosis not present

## 2020-11-15 DIAGNOSIS — N6489 Other specified disorders of breast: Secondary | ICD-10-CM | POA: Diagnosis not present

## 2020-11-20 ENCOUNTER — Other Ambulatory Visit: Payer: Self-pay | Admitting: Family Medicine

## 2020-11-20 DIAGNOSIS — I10 Essential (primary) hypertension: Secondary | ICD-10-CM

## 2020-11-29 ENCOUNTER — Encounter: Payer: BLUE CROSS/BLUE SHIELD | Admitting: Obstetrics and Gynecology

## 2020-12-15 ENCOUNTER — Other Ambulatory Visit: Payer: BLUE CROSS/BLUE SHIELD

## 2020-12-15 DIAGNOSIS — Z20822 Contact with and (suspected) exposure to covid-19: Secondary | ICD-10-CM

## 2020-12-19 ENCOUNTER — Other Ambulatory Visit: Payer: Self-pay

## 2020-12-19 ENCOUNTER — Telehealth: Payer: BLUE CROSS/BLUE SHIELD | Admitting: Family Medicine

## 2020-12-19 LAB — NOVEL CORONAVIRUS, NAA: SARS-CoV-2, NAA: DETECTED — AB

## 2020-12-20 ENCOUNTER — Telehealth: Payer: Self-pay

## 2020-12-20 NOTE — Telephone Encounter (Signed)
Attempted to reach pt. In regard to COVID 19 treatment options. Unable to leave message, voice mailbox is full.

## 2021-02-07 ENCOUNTER — Encounter: Payer: Self-pay | Admitting: Dermatology

## 2021-03-01 ENCOUNTER — Other Ambulatory Visit: Payer: Self-pay

## 2021-03-01 ENCOUNTER — Encounter: Payer: Self-pay | Admitting: Family Medicine

## 2021-03-01 ENCOUNTER — Ambulatory Visit (INDEPENDENT_AMBULATORY_CARE_PROVIDER_SITE_OTHER): Payer: BLUE CROSS/BLUE SHIELD | Admitting: Family Medicine

## 2021-03-01 VITALS — BP 134/80 | HR 70 | Ht 68.0 in | Wt 160.6 lb

## 2021-03-01 DIAGNOSIS — M25521 Pain in right elbow: Secondary | ICD-10-CM | POA: Diagnosis not present

## 2021-03-01 DIAGNOSIS — M7711 Lateral epicondylitis, right elbow: Secondary | ICD-10-CM

## 2021-03-01 DIAGNOSIS — H6983 Other specified disorders of Eustachian tube, bilateral: Secondary | ICD-10-CM | POA: Diagnosis not present

## 2021-03-01 MED ORDER — FLUTICASONE PROPIONATE 50 MCG/ACT NA SUSP: 2.0000 | Freq: Every day | NASAL | 3 refills | Status: DC

## 2021-03-01 MED ORDER — DICLOFENAC SODIUM 1 % EX GEL: 2.0000 g | Freq: Four times a day (QID) | CUTANEOUS | 2 refills | Status: DC | PRN

## 2021-03-01 NOTE — Patient Instructions (Addendum)
Thank you for coming to the office today.  Start nasal steroid Flonase 2 sprays in each nostril daily for 4-6 weeks, may repeat course seasonally or as needed  ------------------------  You most likely have "Tennis Elbow" or "Lateral Epicondylitis" - This is inflammation or tendonitis affecting the muscles of your forearm - Usually it is caused by frequent or daily repetitive activities using your arms, lifting, rotating, pulling, twisting - it can happen over days to months or longer from repetitive strain  One of the most important parts of treatment is rest and avoiding the activities that make it worse.  Could start with Voltaren OTC up to max of 4 times day  Recommend trial of Anti-inflammatory with Naproxen (Naprosyn) 500mg  tabs - take one with food and plenty of water TWICE daily every day (breakfast and dinner), for next 2 to 4 weeks, then you may take only as needed - DO NOT TAKE any ibuprofen, aleve, motrin while you are taking this medicine - It is safe to take Tylenol Ext Str 500mg  tabs - take 1 to 2 (max dose 1000mg ) every 6 hours as needed for breakthrough pain, max 24 hour daily dose is 6 to 8 tablets or 4000mg   Use RICE therapy: - R - Rest / relative rest with activity modification avoid overuse of joint - I - Ice packs (make sure you use a towel or sock / something to protect skin) - C - Compression with ACE wrap to apply pressure and reduce swelling allowing more support  Try a Forearm Tennis Elbow Strap over muscle as demonstrated - use with repetitive activity to reduce strain of muscle    - E - Elevation - if significant swelling, lift leg above heart level (toes above your nose) to help reduce swelling, most helpful at night after day of being on your feet  May consider Physical Therapy referral if interested     Please schedule a Follow-up Appointment to: Return in about 6 weeks (around 04/12/2021) for 4-6 week if unresolved Elbow pain .  If you have any other  questions or concerns, please feel free to call the office or send a message through [place]. You may also schedule an earlier appointment if necessary.  Additionally, you may be receiving a survey about your experience at our office within a few days to 1 week by e-mail or mail. We value your feedback.  Nobie Putnam, DO [place]

## 2021-03-01 NOTE — Progress Notes (Signed)
Subjective:    Patient ID: Sheryl Carr, female    DOB: February 28, 1971, 50 y.o.   MRN: [id number]  Sheryl Carr is a 50 y.o. female presenting on 03/01/2021 for Ear Fullness and Elbow Pain   HPI   Right Ear Crackling/Popping Reports new issue onset few weeks ago, with R ear crackling noise only when she is moving jaw, talking, or chewing. If still not having any symptom. She does not feel any fullness or pressure in ear or sinuses associated with it. She does not have any ear pain.  Right Elbow Lateral Epicondylitis Reports issue with her R elbow causing pain on lateral outside bony part of elbow. Describes can have a persistent aching symptom that radiates a few inches below and above elbow. Can be episodic flare or worsening, seems to feel like her Right arm is weaker and slightly heavier at times. - She is Right handed - She is often seated at computer with arm raised and has elbow bent often using computer. - Now her arm feels weaker and anything heavier she needs to use her left hand to help. - She had an issue back in 2007 had neck X-ray that showed some bone spurs, she would feel arms were stuck at times and heavier. Still has known R hand [facility] joint problem had seen Orthopedic in past for this. Denies numbness tingling, rash redness swelling    Depression screen [facility] 2/9 10/24/2020 09/03/2019 10/21/2018  Decreased Interest 0 0 0  Down, Depressed, Hopeless 0 0 0  PHQ - 2 Score 0 0 0    Social History   Tobacco Use  . Smoking status: Former Smoker    Quit date: 09/24/1990    Years since quitting: 30.4  . Smokeless tobacco: Former Network engineer  . Vaping Use: Never used  Substance Use Topics  . Alcohol use: Yes    Alcohol/week: 2.0 - 3.0 standard drinks    Types: 2 - 3 Standard drinks or equivalent per week    Comment: social  . Drug use: No    Review of Systems Per HPI unless specifically indicated above     Objective:    BP 134/80   Pulse 70    Ht 5\' 8"  (1.727 m)   Wt 160 lb 9.6 oz (72.8 kg)   LMP 08/09/2017 (Exact Date)   SpO2 100%   BMI 24.42 kg/m   Wt Readings from Last 3 Encounters:  03/01/21 160 lb 9.6 oz (72.8 kg)  10/24/20 157 lb (71.2 kg)  08/11/20 155 lb 14.4 oz (70.7 kg)    Physical Exam Vitals and nursing note reviewed.  Constitutional:      General: She is not in acute distress.    Appearance: She is well-developed. She is not diaphoretic.     Comments: Well-appearing, comfortable, cooperative  HENT:     Head: Normocephalic and atraumatic.     Right Ear: Ear canal and external ear normal. There is no impacted cerumen.     Left Ear: Ear canal and external ear normal. There is no impacted cerumen.     Ears:     Comments: Bilateral TM with some slight clear fluid effusion, fullness, no erythema no bulging. No abnormal ear canal Eyes:     General:        Right eye: No discharge.        Left eye: No discharge.     Conjunctiva/sclera: Conjunctivae normal.  Cardiovascular:     Rate and  Rhythm: Normal rate.  Pulmonary:     Effort: Pulmonary effort is normal.  Musculoskeletal:     Comments: Right Elbow Localized reproduced discomfort over Lateral epicondyle of elbow. And assoc musculature. Provoked reproduced pain and some weakness with wrist / forearm pronation and supination, grip intact strength. Sensation intact, no deformity or redness or swelling  Skin:    General: Skin is warm and dry.     Findings: No erythema or rash.  Neurological:     Mental Status: She is alert and oriented to person, place, and time.  Psychiatric:        Behavior: Behavior normal.     Comments: Well groomed, good eye contact, normal speech and thoughts    Results for orders placed or performed in visit on 12/15/20  Novel Coronavirus, NAA ([facility])   Specimen: Nasopharyngeal(NP) swabs in vial transport medium   Nasopharynge  Result Value Ref Range   SARS-CoV-2, NAA Detected (A) Not Detected      Assessment & Plan:    Problem List Items Addressed This Visit   None   Visit Diagnoses    Eustachian tube dysfunction, bilateral    -  Primary   Relevant Medications   fluticasone (FLONASE) 50 MCG/ACT nasal spray   Right elbow pain       Relevant Medications   diclofenac Sodium (VOLTAREN) 1 % GEL   Lateral epicondylitis, right elbow       Relevant Medications   diclofenac Sodium (VOLTAREN) 1 % GEL      Acute vs subacute bilateral eustachian tube dysfunction with secondary mild effusion without loss of hearing or evidence of AOM or sinusitis. Likely related allergic rhinosinusitis based on exam and history.  Plan: 1. Restart Flonase 2 sprays each nare daily for up to 4-6 weeks or longer 2. Continue oral antihistamine 3. Recommend may consider trial OTC oral decongestant for up to 1 week or less Follow-up if not improved or worsening, return criteria   Clinically R forearm tendonitis, more specifically lateral epicondylitis given location and provoking factors on exam - Likely acute on chronic flare due to repetitive daily activity.  Suspect some underlying OA/DJD as well. - Unlikely to be acute muscle tear or fracture  Plan - Reviewed precautions with diagnosis and goal to allow it to heal / modify activity - Start NSAID topical Voltaren OTC vs prior rx Naproxen NSAID 500mg  BID wc x 2-4 weeks, then PRN - Take Tylenol up to 1g TID PRN breakthrough - Recommend RICE therapy, emphasis on Forearm Strap to help support the muscle limit repetitive strain, may use ACE wrap for compression Future consider PT vs return to [facility]   Meds ordered this encounter  Medications  . fluticasone (FLONASE) 50 MCG/ACT nasal spray    Sig: Place 2 sprays into both nostrils daily. Use for 4-6 weeks then stop and use seasonally or as needed.    Dispense:  16 g    Refill:  3  . diclofenac Sodium (VOLTAREN) 1 % GEL    Sig: Apply 2 g topically 4 (four) times daily as needed.    Dispense:  100 g    Refill:  2       Follow up plan: Return in about 6 weeks (around 04/12/2021) for 4-6 week if unresolved Elbow pain .   Nobie Putnam, [facility] 03/01/2021, 9:24 AM

## 2021-04-18 DIAGNOSIS — D225 Melanocytic nevi of trunk: Secondary | ICD-10-CM | POA: Diagnosis not present

## 2021-04-18 DIAGNOSIS — D2262 Melanocytic nevi of left upper limb, including shoulder: Secondary | ICD-10-CM | POA: Diagnosis not present

## 2021-04-18 DIAGNOSIS — D2272 Melanocytic nevi of left lower limb, including hip: Secondary | ICD-10-CM | POA: Diagnosis not present

## 2021-04-18 DIAGNOSIS — D2261 Melanocytic nevi of right upper limb, including shoulder: Secondary | ICD-10-CM | POA: Diagnosis not present

## 2021-09-21 ENCOUNTER — Encounter: Payer: Self-pay | Admitting: Family Medicine

## 2021-09-23 IMAGING — MG DIGITAL DIAGNOSTIC BILAT W/ TOMO W/ CAD
8 of 11 series · 9 of 27 positions shown · non-contrast
Comparison: Previous exam(s).

CLINICAL DATA: Six-month follow-up for likely benign calcifications
in the right breast.

EXAM:
DIGITAL DIAGNOSTIC BILATERAL MAMMOGRAM WITH CAD AND TOMO

[R ML (1 of 2)]
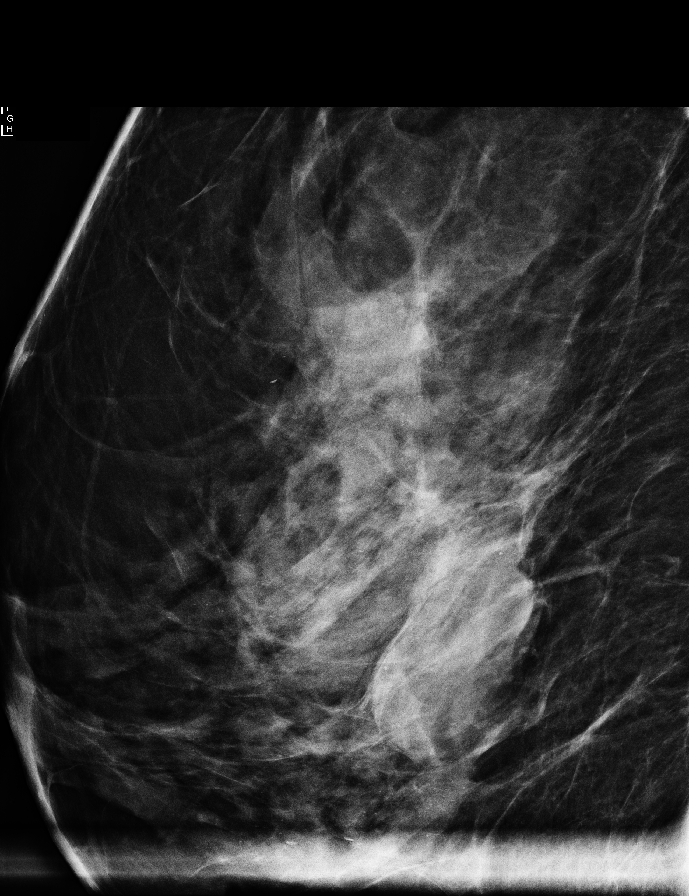

[R CC]
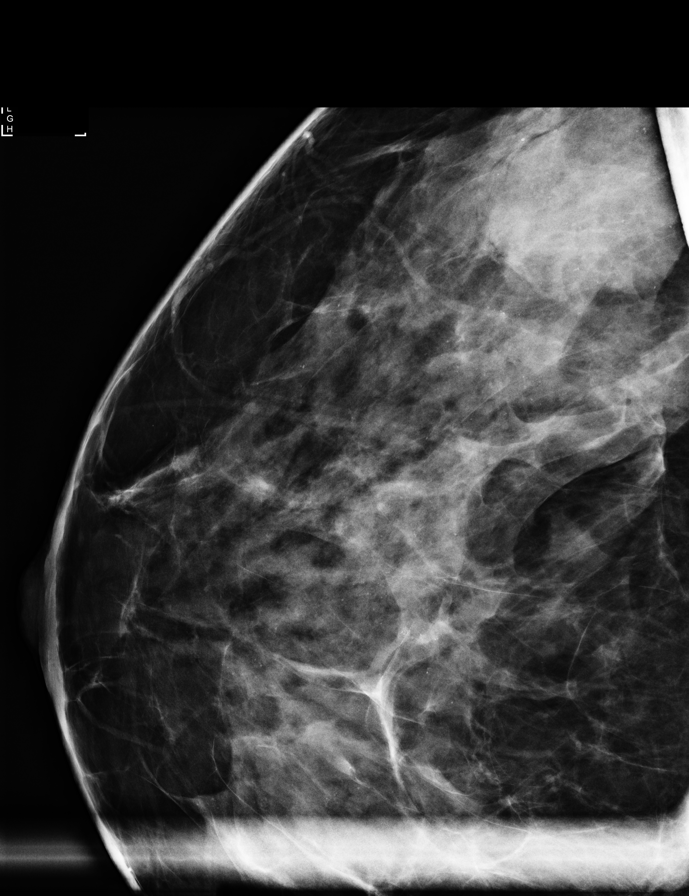

[R ML (2 of 2)]
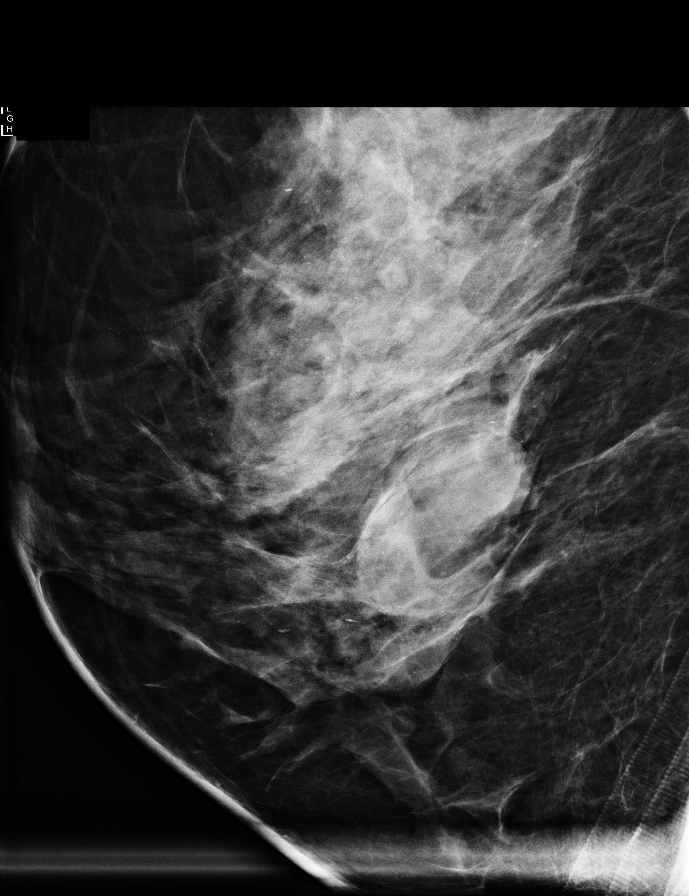

[R MLO synth-2D]
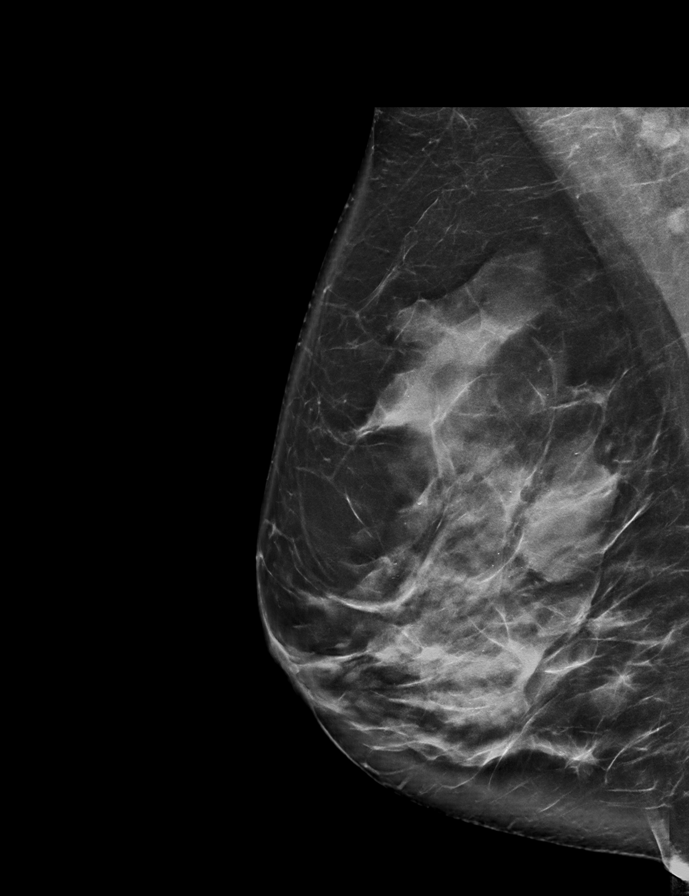

[R CC synth-2D]
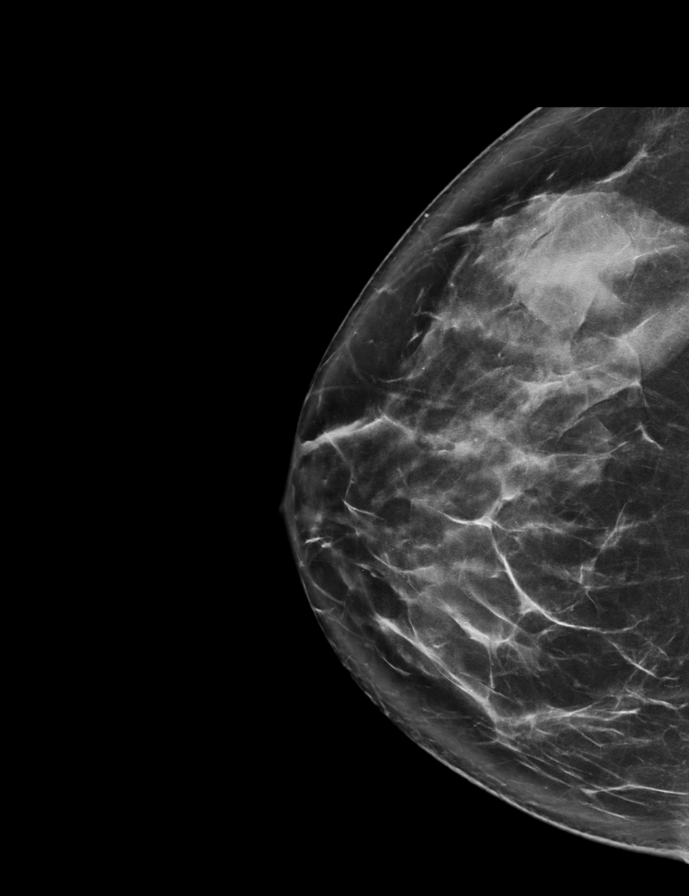

[L MLO synth-2D]
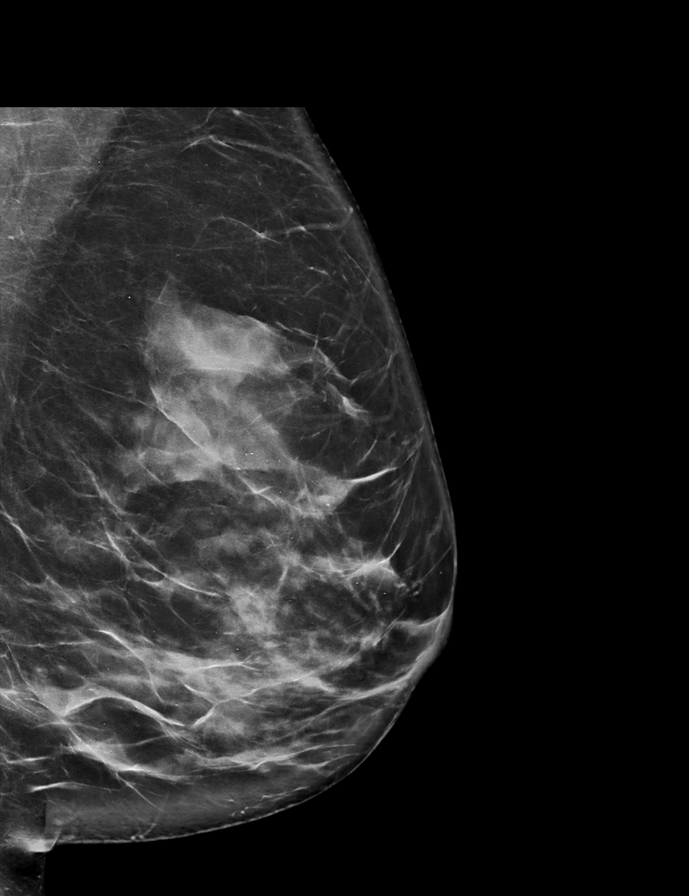

[L CC synth-2D]
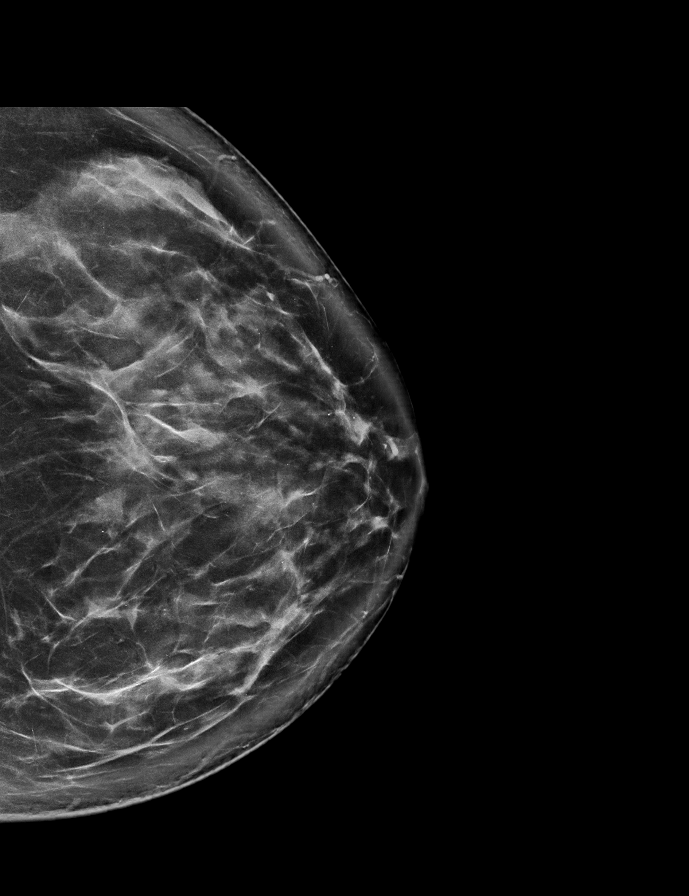

[R CC tomo · 2 of 77 frames shown]
[frame 25/77]
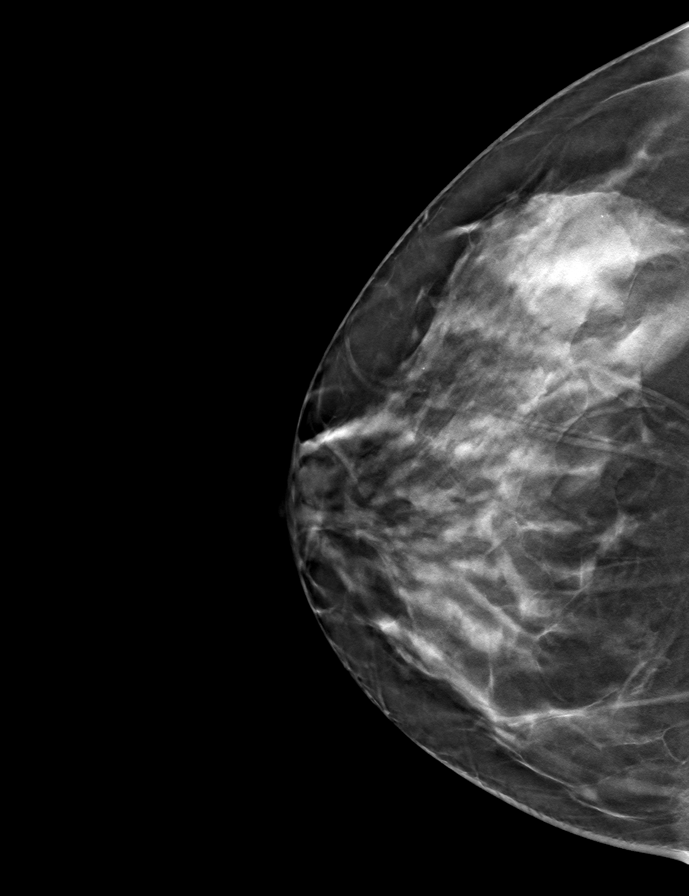
[frame 39/77]
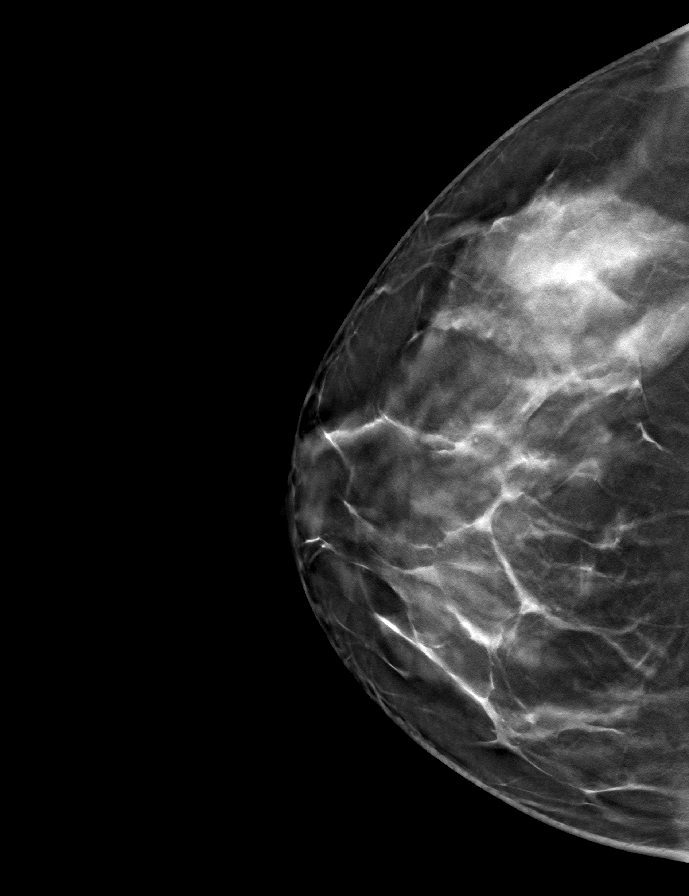

[9 of 27 positions shown; findings below may reference images not displayed]

ACR Breast Density Category c: The breast tissue is heterogeneously
dense, which may obscure small masses.
FINDINGS: Bilateral scattered calcifications are seen throughout both breasts.
Many of these calcifications layer on the true lateral view, which
is consistent with benign milk of calcium. The previously documented
cyst in the lateral right breast appears slightly larger, but is
benign. No suspicious calcifications, masses or areas of distortion
are seen in the bilateral breasts.

Mammographic images were processed with CAD.
IMPRESSION: 1. The calcifications scattered through both breasts are consistent
with benign milk of calcium. No suspicious groups of calcifications
are identified.

2.  No mammographic evidence of malignancy in the bilateral breasts.

RECOMMENDATION:
Screening mammogram in one year.(Code:W2-9-P8M)

I have discussed the findings and recommendations with the patient.
If applicable, a reminder letter will be sent to the patient
regarding the next appointment.

BI-RADS CATEGORY  2: Benign.

## 2021-09-25 ENCOUNTER — Other Ambulatory Visit: Payer: Self-pay | Admitting: Obstetrics and Gynecology

## 2021-09-26 ENCOUNTER — Other Ambulatory Visit: Payer: Self-pay

## 2021-09-26 DIAGNOSIS — Z1231 Encounter for screening mammogram for malignant neoplasm of breast: Secondary | ICD-10-CM

## 2021-09-29 ENCOUNTER — Ambulatory Visit: Payer: BC Managed Care – PPO | Admitting: Family Medicine

## 2021-10-05 DIAGNOSIS — D2362 Other benign neoplasm of skin of left upper limb, including shoulder: Secondary | ICD-10-CM | POA: Diagnosis not present

## 2021-10-05 DIAGNOSIS — R208 Other disturbances of skin sensation: Secondary | ICD-10-CM | POA: Diagnosis not present

## 2021-10-05 DIAGNOSIS — D485 Neoplasm of uncertain behavior of skin: Secondary | ICD-10-CM | POA: Diagnosis not present

## 2021-10-20 ENCOUNTER — Other Ambulatory Visit: Payer: Self-pay

## 2021-10-20 DIAGNOSIS — R7309 Other abnormal glucose: Secondary | ICD-10-CM

## 2021-10-20 DIAGNOSIS — E7800 Pure hypercholesterolemia, unspecified: Secondary | ICD-10-CM

## 2021-10-20 DIAGNOSIS — I10 Essential (primary) hypertension: Secondary | ICD-10-CM

## 2021-10-20 DIAGNOSIS — Z0000 Encounter for general adult medical examination without abnormal findings: Secondary | ICD-10-CM

## 2021-10-23 ENCOUNTER — Other Ambulatory Visit: Payer: BC Managed Care – PPO

## 2021-10-23 DIAGNOSIS — I10 Essential (primary) hypertension: Secondary | ICD-10-CM | POA: Diagnosis not present

## 2021-10-23 DIAGNOSIS — Z0000 Encounter for general adult medical examination without abnormal findings: Secondary | ICD-10-CM | POA: Diagnosis not present

## 2021-10-23 DIAGNOSIS — E7800 Pure hypercholesterolemia, unspecified: Secondary | ICD-10-CM | POA: Diagnosis not present

## 2021-10-23 DIAGNOSIS — R7309 Other abnormal glucose: Secondary | ICD-10-CM | POA: Diagnosis not present

## 2021-10-24 LAB — CBC WITH DIFFERENTIAL/PLATELET
Absolute Monocytes: 352 cells/uL (ref 200–950)
Basophils Absolute: 28 cells/uL (ref 0–200)
Basophils Relative: 0.5 %
Eosinophils Absolute: 121 cells/uL (ref 15–500)
Eosinophils Relative: 2.2 %
HCT: 39.4 % (ref 35.0–45.0)
Hemoglobin: 13 g/dL (ref 11.7–15.5)
Lymphs Abs: 1188 cells/uL (ref 850–3900)
MCH: 29.6 pg (ref 27.0–33.0)
MCHC: 33 g/dL (ref 32.0–36.0)
MCV: 89.7 fL (ref 80.0–100.0)
MPV: 10.3 fL (ref 7.5–12.5)
Monocytes Relative: 6.4 %
Neutro Abs: 3812 cells/uL (ref 1500–7800)
Neutrophils Relative %: 69.3 %
Platelets: 215 10*3/uL (ref 140–400)
RBC: 4.39 10*6/uL (ref 3.80–5.10)
RDW: 11.8 % (ref 11.0–15.0)
Total Lymphocyte: 21.6 %
WBC: 5.5 10*3/uL (ref 3.8–10.8)

## 2021-10-24 LAB — COMPLETE METABOLIC PANEL WITH GFR
AG Ratio: 1.9 (calc) (ref 1.0–2.5)
ALT: 25 U/L (ref 6–29)
AST: 16 U/L (ref 10–35)
Albumin: 4.3 g/dL (ref 3.6–5.1)
Alkaline phosphatase (APISO): 60 U/L (ref 31–125)
BUN: 17 mg/dL (ref 7–25)
CO2: 29 mmol/L (ref 20–32)
Calcium: 9 mg/dL (ref 8.6–10.2)
Chloride: 103 mmol/L (ref 98–110)
Creat: 0.7 mg/dL (ref 0.50–0.99)
Globulin: 2.3 g/dL (calc) (ref 1.9–3.7)
Glucose, Bld: 92 mg/dL (ref 65–99)
Potassium: 4.5 mmol/L (ref 3.5–5.3)
Sodium: 139 mmol/L (ref 135–146)
Total Bilirubin: 0.7 mg/dL (ref 0.2–1.2)
Total Protein: 6.6 g/dL (ref 6.1–8.1)
eGFR: 106 mL/min/{1.73_m2} (ref >=60)

## 2021-10-24 LAB — LIPID PANEL
Cholesterol: 179 mg/dL (ref <200)
HDL: 57 mg/dL (ref >=50)
LDL Cholesterol (Calc): 101 mg/dL (calc) — ABNORMAL HIGH
Non-HDL Cholesterol (Calc): 122 mg/dL (calc) (ref <130)
Total CHOL/HDL Ratio: 3.1 (calc) (ref <5.0)
Triglycerides: 115 mg/dL (ref <150)

## 2021-10-24 LAB — HEMOGLOBIN A1C
Hgb A1c MFr Bld: 5 % of total Hgb (ref <5.7)
Mean Plasma Glucose: 97 mg/dL
eAG (mmol/L): 5.4 mmol/L

## 2021-10-24 LAB — TSH: TSH: 4.14 mIU/L

## 2021-10-30 ENCOUNTER — Encounter: Payer: BLUE CROSS/BLUE SHIELD | Admitting: Family Medicine

## 2021-11-13 IMAGING — MG US ASPIRATION RIGHT BREAST
1 series · 4 of 4 positions shown · non-contrast
Comparison: Previous exams.

CLINICAL DATA: Patient with painful palpable cyst within the right
breast 8:30 o'clock for aspiration.

EXAM:
ULTRASOUND GUIDED RIGHT BREAST CYST ASPIRATION

[Series 1: MG view · 0.07mm/px · 4 of 4 slices shown]
[im 1/4]
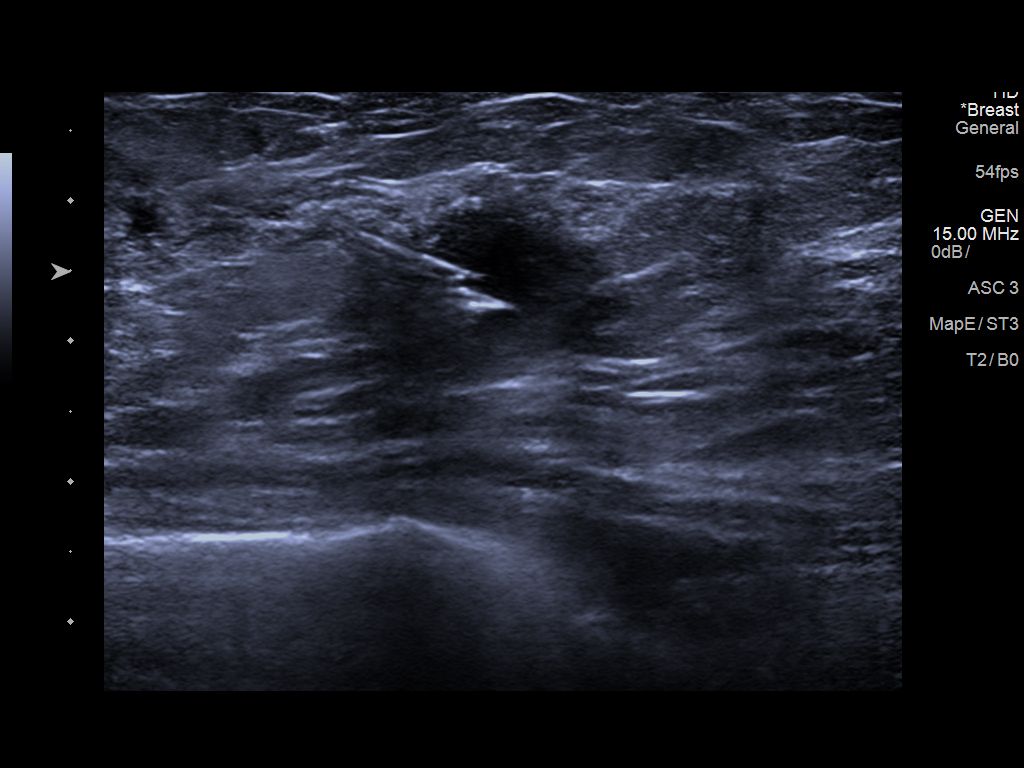
[im 2/4]
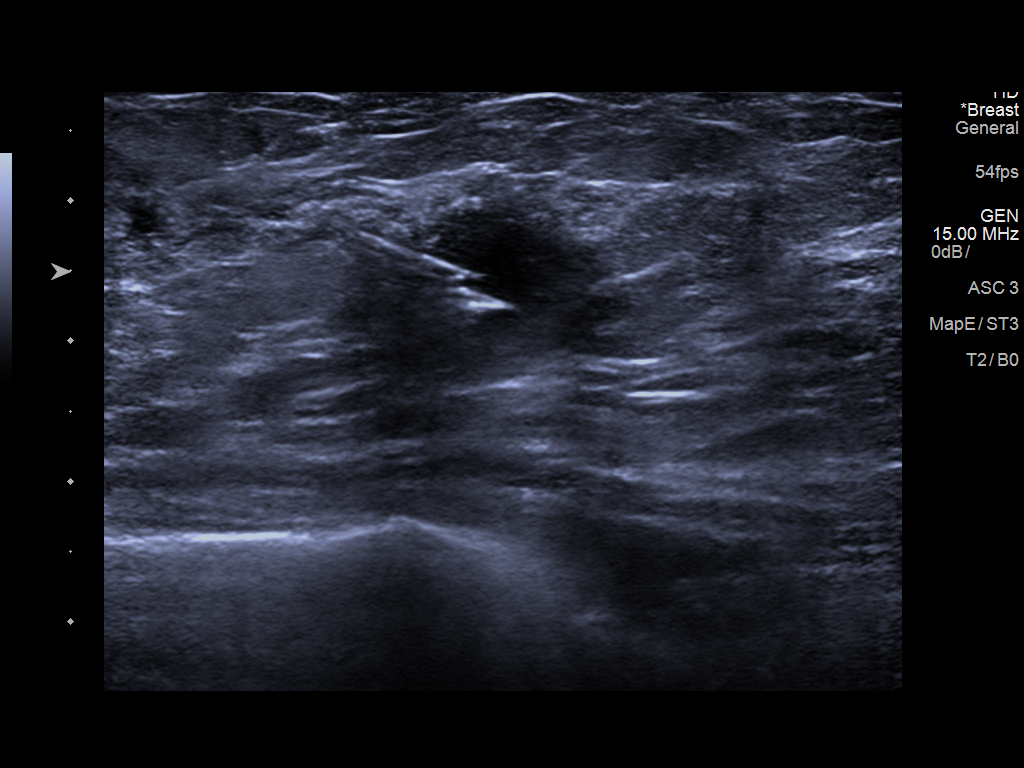
[im 3/4]
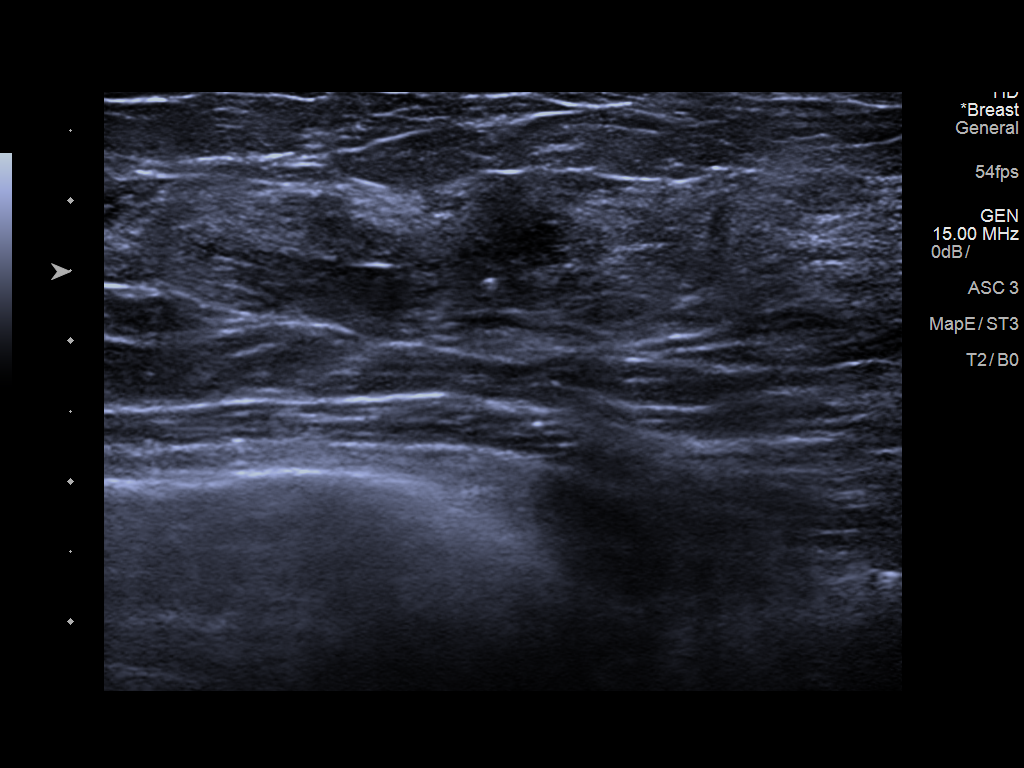
[im 4/4]
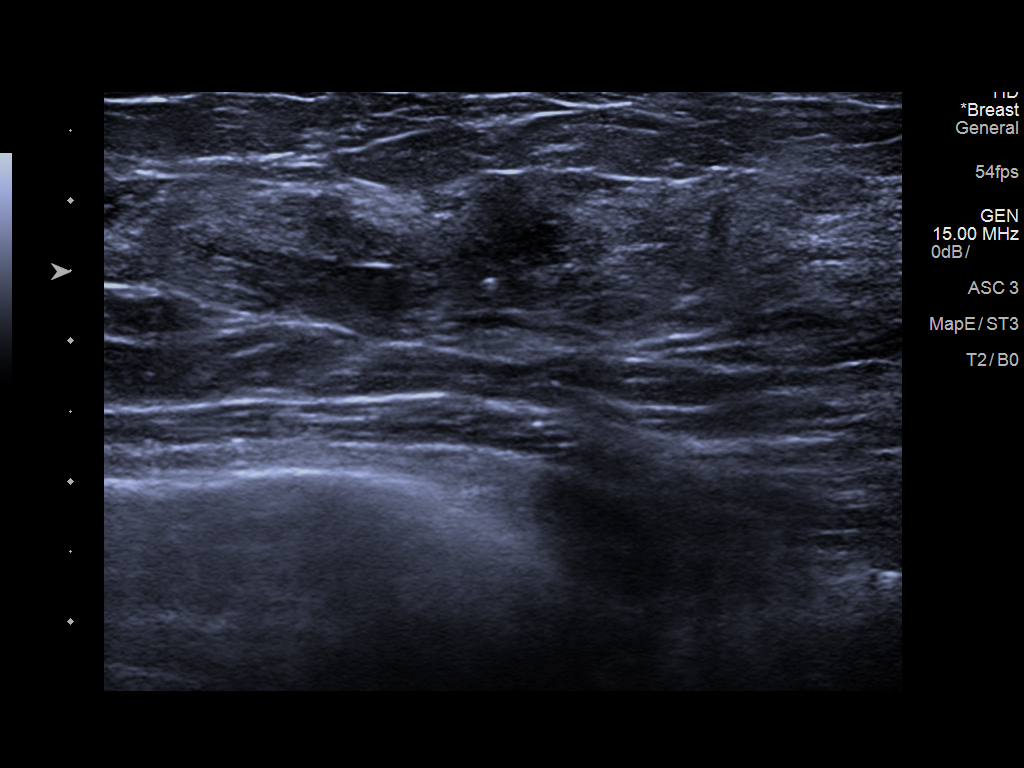

[4 of 4 positions shown; findings below may reference images not displayed]

PROCEDURE:
Using sterile technique, 1% lidocaine, under direct ultrasound
visualization, needle aspiration of right breast cyst 8:30 o'clock
was performed. Approximately 5 cc of thin yellowish fluid was
aspirated.
IMPRESSION: Ultrasound-guided aspiration of right breast cyst 8:30 o'clock. No
apparent complications.

RECOMMENDATIONS:
Return to annual screening mammography [DATE].

## 2021-11-17 ENCOUNTER — Other Ambulatory Visit: Payer: Self-pay

## 2021-11-17 ENCOUNTER — Ambulatory Visit
Admission: RE | Admit: 2021-11-17 | Discharge: 2021-11-17 | Disposition: A | Discharge status: Home / Self Care | Payer: BC Managed Care – PPO | Source: Ambulatory Visit | Attending: Obstetrics and Gynecology | Admitting: Obstetrics and Gynecology

## 2021-11-17 DIAGNOSIS — Z1231 Encounter for screening mammogram for malignant neoplasm of breast: Secondary | ICD-10-CM | POA: Insufficient documentation

## 2021-11-23 NOTE — Patient Instructions (Addendum)
Preventive Care 40-50 Years Old, Female °Preventive care refers to lifestyle choices and visits with your health care provider that can promote health and wellness. Preventive care visits are also called wellness exams. °What can I expect for my preventive care visit? °Counseling °Your health care provider may ask you questions about your: °Medical history, including: °Past medical problems. °Family medical history. °Pregnancy history. °Current health, including: °Menstrual cycle. °Method of birth control. °Emotional well-being. °Home life and relationship well-being. °Sexual activity and sexual health. °Lifestyle, including: °Alcohol, nicotine or tobacco, and drug use. °Access to firearms. °Diet, exercise, and sleep habits. °Work and work environment. °Sunscreen use. °Safety issues such as seatbelt and bike helmet use. °Physical exam °Your health care provider will check your: °Height and weight. These may be used to calculate your BMI (body mass index). BMI is a measurement that tells if you are at a healthy weight. °Waist circumference. This measures the distance around your waistline. This measurement also tells if you are at a healthy weight and may help predict your risk of certain diseases, such as type 2 diabetes and high blood pressure. °Heart rate and blood pressure. °Body temperature. °Skin for abnormal spots. °What immunizations do I need? °Vaccines are usually given at various ages, according to a schedule. Your health care provider will recommend vaccines for you based on your age, medical history, and lifestyle or other factors, such as travel or where you work. °What tests do I need? °Screening °Your health care provider may recommend screening tests for certain conditions. This may include: °Lipid and cholesterol levels. °Diabetes screening. This is done by checking your blood sugar (glucose) after you have not eaten for a while (fasting). °Pelvic exam and Pap test. °Hepatitis B test. °Hepatitis C  test. °HIV (human immunodeficiency virus) test. °STI (sexually transmitted infection) testing, if you are at risk. °Lung cancer screening. °Colorectal cancer screening. °Mammogram. Talk with your health care provider about when you should start having regular mammograms. This may depend on whether you have a family history of breast cancer. °BRCA-related cancer screening. This may be done if you have a family history of breast, ovarian, tubal, or peritoneal cancers. °Bone density scan. This is done to screen for osteoporosis. °Talk with your health care provider about your test results, treatment options, and if necessary, the need for more tests. °Follow these instructions at home: °Eating and drinking ° °Eat a diet that includes fresh fruits and vegetables, whole grains, lean protein, and low-fat dairy products. °Take vitamin and mineral supplements as recommended by your health care provider. °Do not drink alcohol if: °Your health care provider tells you not to drink. °You are pregnant, may be pregnant, or are planning to become pregnant. °If you drink alcohol: °Limit how much you have to 0-1 drink a day. °Know how much alcohol is in your drink. In the [place] one drink equals one 12 oz bottle of beer (355 mL), one 5 oz glass of wine (148 mL), or one 1½ oz glass of hard liquor (44 mL). °Lifestyle °Brush your teeth every morning and night with fluoride toothpaste. Floss one time each day. °Exercise for at least 30 minutes 5 or more days each week. °Do not use any products that contain nicotine or tobacco. These products include cigarettes, chewing tobacco, and vaping devices, such as e-cigarettes. If you need help quitting, ask your health care provider. °Do not use drugs. °If you are sexually active, practice safe sex. Use a condom or other form of protection to prevent   STIs. If you do not wish to become pregnant, use a form of birth control. If you plan to become pregnant, see your health care provider for a  prepregnancy visit. Take aspirin only as told by your health care provider. Make sure that you understand how much to take and what form to take. Work with your health care provider to find out whether it is safe and beneficial for you to take aspirin daily. Find healthy ways to manage stress, such as: Meditation, yoga, or listening to music. Journaling. Talking to a trusted person. Spending time with friends and family. Minimize exposure to UV radiation to reduce your risk of skin cancer. Safety Always wear your seat belt while driving or riding in a vehicle. Do not drive: If you have been drinking alcohol. Do not ride with someone who has been drinking. When you are tired or distracted. While texting. If you have been using any mind-altering substances or drugs. Wear a helmet and other protective equipment during sports activities. If you have firearms in your house, make sure you follow all gun safety procedures. Seek help if you have been physically or sexually abused. What's next? Visit your health care provider once a year for an annual wellness visit. Ask your health care provider how often you should have your eyes and teeth checked. Stay up to date on all vaccines. This information is not intended to replace advice given to you by your health care provider. Make sure you discuss any questions you have with your health care provider. Document Revised: 05/24/2021 Document Reviewed: 05/24/2021 Elsevier Patient Education  [employer].

## 2021-11-23 NOTE — Progress Notes (Signed)
GYNECOLOGY ANNUAL PHYSICAL EXAM PROGRESS NOTE  Subjective:    Sheryl Carr is a 50 y.o. G31P2002 female who presents for an annual exam. The patient is sexually active. The patient participates in regular exercise: no. Has the patient ever been transfused or tattooed?: no. The patient reports that there is not domestic violence in her life.   The patient has the following complaints today:  Notes that her mood has had some mild changes. Wonders if it may be due to hormones or something else. Notes no other real changes. Has normal life stressors.    Menstrual History: Menarche age: 38 Patient's last menstrual period was 08/09/2017 (exact date).   Gynecologic History:  Contraception: status post hysterectomy History of STI's: Denies Last Pap: 07/2015. Results were: normal.  Denies h/o abnormal pap smears. Last mammogram: 11/17/2021. Results were: normal Last colonoscopy: 11/02/2019: Several benign polyps, repeat in 5 years.      OB History  Gravida Para Term Preterm AB Living  2 2 2  0 0 2  SAB IAB Ectopic Multiple Live Births  0 0 0 0 2    # Outcome Date GA Lbr Len/2nd Weight Sex Delivery Anes PTL Lv  2 Term         LIV  1 Term         LIV    Past Medical History:  Diagnosis Date   Anemia    Chronic kidney disease    H/O KIDNEY STONES   Endometrial polyp 2016   hysteroscopically removed   GERD (gastroesophageal reflux disease)    H/O mammogram 05/11/2015   Hematuria    History of kidney stones    History of uterine fibroid    s/p hysterectomy 08/2017   HTN (hypertension)    PT WAS TAKEN OFF HER BP MED IN MAY/JUNE OF 2016 BUT BP IS ELEVATED AGAIN    Past Surgical History:  Procedure Laterality Date   ABDOMINAL HYSTERECTOMY     BREAST CYST ASPIRATION Right 12/24/2019   CESAREAN SECTION     COLONOSCOPY WITH PROPOFOL N/A 11/02/2019   Procedure: COLONOSCOPY WITH PROPOFOL;  Surgeon: Virgel Manifold, MD;  Location: [facility];  Service: Endoscopy;   Laterality: N/A;   HYSTERECTOMY ABDOMINAL WITH SALPINGECTOMY Bilateral 08/19/2017   Procedure: HYSTERECTOMY ABDOMINAL WITH BILATERAL SALPINGECTOMY;  Surgeon: [name], MD;  Location: [facility];  Service: Gynecology;  Laterality: Bilateral;   HYSTEROSCOPY WITH D & C N/A 11/14/2015   Procedure: DILATATION AND CURETTAGE /HYSTEROSCOPY;  Surgeon: [name], MD;  Location: [facility];  Service: Gynecology;  Laterality: N/A;   KIDNEY STONE SURGERY      Family History  Problem Relation Age of Onset   Diabetes Mellitus I Mother    Diabetes Mellitus I Maternal Grandmother    Prostate cancer Maternal Grandfather    Diabetes Mellitus I Paternal Grandmother    Breast cancer Neg Hx     Social History   Socioeconomic History   Marital status: Married    Spouse name: [name]   Number of children: 2   Years of education: college   Highest education level: Not on file  Occupational History   Occupation: n/a    Employer: [employer]  Tobacco Use   Smoking status: Former    Types: Cigarettes    Quit date: 09/24/1990    Years since quitting: 31.1   Smokeless tobacco: Former  Scientific laboratory technician Use: Never used  Substance and Sexual Activity   Alcohol use: Yes  Alcohol/week: 2.0 - 3.0 standard drinks    Types: 2 - 3 Standard drinks or equivalent per week    Comment: social   Drug use: No   Sexual activity: Yes    Birth control/protection: None  Other Topics Concern   Not on file  Social History Narrative   Patient is married [name]) and lives at home with her husband and one child, one child is in college.   Patient is working full-time.   Patient has a college education.   Patient is right-handed.   Patient drinks 1-2 cups of coffee daily, soda and tea are occasionally, not daily.   Social Determinants of Health   Financial Resource Strain: Not on file  Food Insecurity: Not on file  Transportation Needs: Not on file  Physical Activity: Not on file  Stress: Not on file   Social Connections: Not on file  Intimate Partner Violence: Not on file    Current Outpatient Medications on File Prior to Visit  Medication Sig Dispense Refill   diclofenac Sodium (VOLTAREN) 1 % GEL Apply 2 g topically 4 (four) times daily as needed. 100 g 2   econazole nitrate 1 % cream Apply once a day as needed for rash.  2   fluticasone (FLONASE) 50 MCG/ACT nasal spray Place 2 sprays into both nostrils daily. Use for 4-6 weeks then stop and use seasonally or as needed. 16 g 3   losartan (COZAAR) 50 MG tablet Take 1 tablet (50 mg total) by mouth daily. 90 tablet 3   Multiple Vitamins-Calcium (ONE-A-DAY WOMENS FORMULA PO) Take 1 tablet by mouth daily.      psyllium (METAMUCIL SMOOTH TEXTURE) 58.6 % powder Take 1 packet by mouth daily. (Patient taking differently: Take 1 packet by mouth daily. As needed) 283 g 1   No current facility-administered medications on file prior to visit.    Allergies  Allergen Reactions   Pollen Extract     itchy eyes, dull headaches, runny nose   Penicillins Rash    Has patient had a PCN reaction causing immediate rash, facial/tongue/throat swelling, SOB or lightheadedness with hypotension: No Has patient had a PCN reaction causing severe rash involving mucus membranes or skin necrosis: No Has patient had a PCN reaction that required hospitalization: No Has patient had a PCN reaction occurring within the last 10 years: No If all of the above answers are "NO", then may proceed with Cephalosporin use.      Review of Systems Constitutional: negative for chills, fatigue, fevers and sweats Eyes: negative for irritation, redness and visual disturbance Ears, nose, mouth, throat, and face: negative for hearing loss, nasal congestion, snoring and tinnitus Respiratory: negative for asthma, cough, sputum Cardiovascular: negative for chest pain, dyspnea, exertional chest pressure/discomfort, irregular heart beat, palpitations and syncope Gastrointestinal:  negative for abdominal pain, change in bowel habits, nausea and vomiting Genitourinary: negative for abnormal menstrual periods, genital lesions, sexual problems and vaginal discharge, dysuria and urinary incontinence Integument/breast: negative for breast lump, breast tenderness and nipple discharge Hematologic/lymphatic: negative for bleeding and easy bruising Musculoskeletal:negative for back pain and muscle weakness Neurological: negative for dizziness, headaches, vertigo and weakness Endocrine: negative for diabetic symptoms including polydipsia, polyuria and skin dryness Allergic/Immunologic: negative for hay fever and urticaria      Objective:   Blood pressure (!) 131/102, pulse 69, resp. rate 16, height 5\' 8"  (1.727 m), weight 163 lb 8 oz (74.2 kg), last menstrual period 08/09/2017. Body mass index is 24.86 kg/m.  Repeat BP 129/86.  General Appearance:    Alert, cooperative, no distress, appears stated age  Head:    Normocephalic, without obvious abnormality, atraumatic  Eyes:    PERRL, conjunctiva/corneas clear, EOM's intact, both eyes  Ears:    Normal external ear canals, both ears  Nose:   Nares normal, septum midline, mucosa normal, no drainage or sinus tenderness  Throat:   Lips, mucosa, and tongue normal; teeth and gums normal  Neck:   Supple, symmetrical, trachea midline, no adenopathy; thyroid: no enlargement/tenderness/nodules; no carotid bruit or JVD  Back:     Symmetric, no curvature, ROM normal, no CVA tenderness  Lungs:     Clear to auscultation bilaterally, respirations unlabored  Chest Wall:    No tenderness or deformity   Heart:    Regular rate and rhythm, S1 and S2 normal, no murmur, rub or gallop  Breast Exam:    No tenderness, masses, or nipple abnormality  Abdomen:     Soft, non-tender, bowel sounds active all four quadrants, no masses, no organomegaly.    Genitalia:    Pelvic:external genitalia normal, vagina without lesions, discharge, or tenderness,  rectovaginal septum  normal. Cervix normal in appearance, no cervical motion tenderness, no adnexal masses or tenderness.  Uterus normal size, shape, mobile, regular contours, nontender.  Rectal:    Normal external sphincter.  No hemorrhoids appreciated. Internal exam not done.   Extremities:   Extremities normal, atraumatic, no cyanosis or edema  Pulses:   2+ and symmetric all extremities  Skin:   Skin color, texture, turgor normal, no rashes or lesions  Lymph nodes:   Cervical, supraclavicular, and axillary nodes normal  Neurologic:   CNII-XII intact, normal strength, sensation and reflexes throughout   .  Labs:  Lab Results  Component Value Date   WBC 5.5 10/23/2021   HGB 13.0 10/23/2021   HCT 39.4 10/23/2021   MCV 89.7 10/23/2021   PLT 215 10/23/2021    Lab Results  Component Value Date   CREATININE 0.70 10/23/2021   BUN 17 10/23/2021   NA 139 10/23/2021   K 4.5 10/23/2021   CL 103 10/23/2021   CO2 29 10/23/2021    Lab Results  Component Value Date   ALT 25 10/23/2021   AST 16 10/23/2021   ALKPHOS 50 06/25/2017   BILITOT 0.7 10/23/2021    Lab Results  Component Value Date   TSH 4.14 10/23/2021     Assessment:   1. Encounter for screening mammogram for malignant neoplasm of breast   2. Encounter for well woman exam with routine gynecological exam   3. Essential hypertension   4. Mood changes   5. Perimenopausal      Plan:  Blood tests: UTD done on 10/23/2021 with PCP. Breast self exam technique reviewed and patient encouraged to perform self-exam monthly. Contraception: status post hysterectomy. Discussed healthy lifestyle modifications. Mammogram  UTD Pap smear No longer needed. COVID vaccination status:UTD Flu: UTD, received through job Essential HTN, managed by PCP.   Mood changes, possibly perimenopausal vs menopausal (patient s/p hysterectomy so no cycles).  Will check hormone levels. If no issues, may need to consider other causes.  Follow up  in 1 year for annual exam  [name], MD [facility]

## 2021-11-24 ENCOUNTER — Encounter: Payer: Self-pay | Admitting: Obstetrics and Gynecology

## 2021-11-24 ENCOUNTER — Other Ambulatory Visit: Payer: Self-pay

## 2021-11-24 ENCOUNTER — Ambulatory Visit (INDEPENDENT_AMBULATORY_CARE_PROVIDER_SITE_OTHER): Payer: BC Managed Care – PPO | Admitting: Obstetrics and Gynecology

## 2021-11-24 VITALS — BP 131/90 | HR 66 | Resp 16 | Ht 68.0 in | Wt 163.5 lb

## 2021-11-24 DIAGNOSIS — I10 Essential (primary) hypertension: Secondary | ICD-10-CM

## 2021-11-24 DIAGNOSIS — R4586 Emotional lability: Secondary | ICD-10-CM | POA: Diagnosis not present

## 2021-11-24 DIAGNOSIS — Z01419 Encounter for gynecological examination (general) (routine) without abnormal findings: Secondary | ICD-10-CM

## 2021-11-24 DIAGNOSIS — N951 Menopausal and female climacteric states: Secondary | ICD-10-CM | POA: Diagnosis not present

## 2021-11-24 DIAGNOSIS — Z1231 Encounter for screening mammogram for malignant neoplasm of breast: Secondary | ICD-10-CM

## 2021-11-25 LAB — ESTRADIOL: Estradiol: 83.4 pg/mL

## 2021-11-25 LAB — PROGESTERONE: Progesterone: <0.1 ng/mL

## 2021-11-25 LAB — FSH/LH
FSH: 4.7 m[IU]/mL
LH: 6.4 m[IU]/mL

## 2021-12-12 ENCOUNTER — Encounter: Payer: BC Managed Care – PPO | Admitting: Family Medicine

## 2021-12-18 ENCOUNTER — Other Ambulatory Visit: Payer: Self-pay

## 2021-12-18 ENCOUNTER — Encounter: Payer: Self-pay | Admitting: Family Medicine

## 2021-12-18 ENCOUNTER — Ambulatory Visit (INDEPENDENT_AMBULATORY_CARE_PROVIDER_SITE_OTHER): Payer: BC Managed Care – PPO | Admitting: Family Medicine

## 2021-12-18 VITALS — BP 119/79 | HR 79 | Ht 68.0 in | Wt 162.2 lb

## 2021-12-18 DIAGNOSIS — Z0000 Encounter for general adult medical examination without abnormal findings: Secondary | ICD-10-CM | POA: Diagnosis not present

## 2021-12-18 DIAGNOSIS — I10 Essential (primary) hypertension: Secondary | ICD-10-CM

## 2021-12-18 MED ORDER — LOSARTAN POTASSIUM 25 MG PO TABS: 25.0000 mg | ORAL_TABLET | Freq: Every day | ORAL | 3 refills | Status: DC

## 2021-12-18 NOTE — Patient Instructions (Addendum)
Thank you for coming to the office today.  Maybe consider a Chicken Pox Varicella lab test for antibody to see if you have been exposed before.  Reconsider Shingles vaccine.  Reduced dose Losartan from 50 to 25mg  daily. Check BP regularly in late morning. We can see if you can come on lower BP reading.  BP goal remains < 140 / 90 and IDEALLY < 135/85. Eventually let me know if plan to discontinue med we can discuss.  OTC Peppermint Oil (Triple Coated Capsule) 180mg  take one 3 times daily to reduce diarrhea   DUE for FASTING BLOOD WORK (no food or drink after midnight before the lab appointment, only water or coffee without cream/sugar on the morning of)  SCHEDULE "Lab Only" visit in the morning at the clinic for lab draw in 1 YEAR  - Make sure Lab Only appointment is at about 1 week before your next appointment, so that results will be available  For Lab Results, once available within 2-3 days of blood draw, you can can log in to MyChart online to view your results and a brief explanation. Also, we can discuss results at next follow-up visit.   Please schedule a Follow-up Appointment to: Return in about 1 year (around 12/18/2022) for 1 year fasting lab only then 1 week later Annual Physical.  If you have any other questions or concerns, please feel free to call the office or send a message through [place]. You may also schedule an earlier appointment if necessary.  Additionally, you may be receiving a survey about your experience at our office within a few days to 1 week by e-mail or mail. We value your feedback.  Nobie Putnam, DO [place]

## 2021-12-18 NOTE — Progress Notes (Signed)
Subjective:    Patient ID: Sheryl Carr, female    DOB: 25-Aug-1971, 51 y.o.   MRN: [id number]  Sheryl Carr is a 51 y.o. female presenting on 12/18/2021 for Annual Exam   HPI  Here for Annual Physical and lab review.   Wellness / Lifestyle BMI >24 / Elevated LDL Lifestyle improvement overall, weight stable Last lab with Improved LDL to 101 now See below diet - gluten free Not on cholesterol rx medication   CHRONIC HTN: Reports no new concerns. She says originally dx with HTN 2009 approx after 2nd child then later treated in 2014, previously on Lisinopril Current Meds - Losartan 50mg  daily   Reports good compliance, took meds today. Tolerating well, w/o complaints. Denies CP, dyspnea, HA, edema, dizziness / lightheadedness       Additional complaint:   History of R Thumb Arthritis X-rays, given rx Naproxen PRN, had seen [facility] specialist. Has done well but still has symptoms with thumb worse cold weather   Admits multiple mental health questions if related anxiety and other life factors/stressors, she is not ready to pursue diagnosis and treatment. She admits discussed w/ GYN and they tested her for menopause, results were negative, but may consider progesterone supplement. She is not ready to pursue medication or referral yet.  Update she had mole removed at [facility].   Health Maintenance:  Colonoscopy completed 11/02/19, Dr Bonna Gains AGI - repeat in 5 years. 2025   Next annual mammogram coming up, had prior bi-rads 3 that was negative 05/2019   UTD Flu   COVID Vaccine status - Pfizer 02/18/20 and 03/10/20. Considering booster.  Depression screen [facility] 2/9 12/18/2021 11/24/2021 10/24/2020  Decreased Interest 0 0 0  Down, Depressed, Hopeless 0 0 0  PHQ - 2 Score 0 0 0  Altered sleeping 1 - -  Tired, decreased energy 0 - -  Change in appetite 0 - -  Feeling bad or failure about yourself  0 - -  Trouble concentrating 0 - -  Moving slowly or  fidgety/restless 0 - -  Suicidal thoughts 0 - -  PHQ-9 Score 1 - -  Difficult doing work/chores Not difficult at all - -   GAD 7 : Generalized Anxiety Score 12/18/2021  Nervous, Anxious, on Edge 1  Control/stop worrying 1  Worry too much - different things 0  Trouble relaxing 0  Restless 0  Easily annoyed or irritable 1  Afraid - awful might happen 0  Total GAD 7 Score 3  Anxiety Difficulty Not difficult at all     Past Medical History:  Diagnosis Date   Anemia    Chronic kidney disease    H/O KIDNEY STONES   Endometrial polyp 2016   hysteroscopically removed   GERD (gastroesophageal reflux disease)    H/O mammogram 05/11/2015   Hematuria    History of kidney stones    History of uterine fibroid    s/p hysterectomy 08/2017   HTN (hypertension)    PT WAS TAKEN OFF HER BP MED IN MAY/JUNE OF 2016 BUT BP IS ELEVATED AGAIN   Past Surgical History:  Procedure Laterality Date   ABDOMINAL HYSTERECTOMY     BREAST CYST ASPIRATION Right 12/24/2019   CESAREAN SECTION     COLONOSCOPY WITH PROPOFOL N/A 11/02/2019   Procedure: COLONOSCOPY WITH PROPOFOL;  Surgeon: Virgel Manifold, MD;  Location: [facility];  Service: Endoscopy;  Laterality: N/A;   HYSTERECTOMY ABDOMINAL WITH SALPINGECTOMY Bilateral 08/19/2017   Procedure: HYSTERECTOMY ABDOMINAL  WITH BILATERAL SALPINGECTOMY;  Surgeon: [name], MD;  Location: [facility];  Service: Gynecology;  Laterality: Bilateral;   HYSTEROSCOPY WITH D & C N/A 11/14/2015   Procedure: DILATATION AND CURETTAGE /HYSTEROSCOPY;  Surgeon: [name], MD;  Location: [facility];  Service: Gynecology;  Laterality: N/A;   KIDNEY STONE SURGERY     Social History   Socioeconomic History   Marital status: Married    Spouse name: [name]   Number of children: 2   Years of education: college   Highest education level: Not on file  Occupational History   Occupation: n/a    Employer: [employer]  Tobacco Use   Smoking status: Former    Types:  Cigarettes    Quit date: 09/24/1990    Years since quitting: 31.2   Smokeless tobacco: Former  Scientific laboratory technician Use: Never used  Substance and Sexual Activity   Alcohol use: Yes    Alcohol/week: 2.0 - 3.0 standard drinks    Types: 2 - 3 Standard drinks or equivalent per week    Comment: social   Drug use: No   Sexual activity: Yes    Birth control/protection: None  Other Topics Concern   Not on file  Social History Narrative   Patient is married [name]) and lives at home with her husband and one child, one child is in college.   Patient is working full-time.   Patient has a college education.   Patient is right-handed.   Patient drinks 1-2 cups of coffee daily, soda and tea are occasionally, not daily.   Social Determinants of Health   Financial Resource Strain: Not on file  Food Insecurity: Not on file  Transportation Needs: Not on file  Physical Activity: Not on file  Stress: Not on file  Social Connections: Not on file  Intimate Partner Violence: Not on file   Family History  Problem Relation Age of Onset   Diabetes Mellitus I Mother    Diabetes Mellitus I Maternal Grandmother    Prostate cancer Maternal Grandfather    Diabetes Mellitus I Paternal Grandmother    Breast cancer Neg Hx    Current Outpatient Medications on File Prior to Visit  Medication Sig   diclofenac Sodium (VOLTAREN) 1 % GEL Apply 2 g topically 4 (four) times daily as needed.   econazole nitrate 1 % cream Apply once a day as needed for rash.   fluticasone (FLONASE) 50 MCG/ACT nasal spray Place 2 sprays into both nostrils daily. Use for 4-6 weeks then stop and use seasonally or as needed.   Multiple Vitamins-Calcium (ONE-A-DAY WOMENS FORMULA PO) Take 1 tablet by mouth daily.    No current facility-administered medications on file prior to visit.    Review of Systems  Constitutional:  Negative for activity change, appetite change, chills, diaphoresis, fatigue and fever.  HENT:  Negative for  congestion and hearing loss.   Eyes:  Negative for visual disturbance.  Respiratory:  Negative for cough, chest tightness, shortness of breath and wheezing.   Cardiovascular:  Negative for chest pain, palpitations and leg swelling.  Gastrointestinal:  Negative for abdominal pain, constipation, diarrhea, nausea and vomiting.  Genitourinary:  Negative for dysuria, frequency and hematuria.  Musculoskeletal:  Negative for arthralgias and neck pain.  Skin:  Negative for rash.  Neurological:  Negative for dizziness, weakness, light-headedness, numbness and headaches.  Hematological:  Negative for adenopathy.  Psychiatric/Behavioral:  Negative for behavioral problems, dysphoric mood and sleep disturbance.   Per HPI unless specifically  indicated above     Objective:    BP 119/79    Pulse 79    Ht 5\' 8"  (1.727 m)    Wt 162 lb 3.2 oz (73.6 kg)    LMP 08/09/2017 (Exact Date)    SpO2 100%    BMI 24.66 kg/m   Wt Readings from Last 3 Encounters:  12/18/21 162 lb 3.2 oz (73.6 kg)  11/24/21 163 lb 8 oz (74.2 kg)  03/01/21 160 lb 9.6 oz (72.8 kg)    Physical Exam Vitals and nursing note reviewed.  Constitutional:      General: She is not in acute distress.    Appearance: She is well-developed. She is not diaphoretic.     Comments: Well-appearing, comfortable, cooperative  HENT:     Head: Normocephalic and atraumatic.  Eyes:     General:        Right eye: No discharge.        Left eye: No discharge.     Conjunctiva/sclera: Conjunctivae normal.     Pupils: Pupils are equal, round, and reactive to light.  Neck:     Thyroid: No thyromegaly.  Cardiovascular:     Rate and Rhythm: Normal rate and regular rhythm.     Pulses: Normal pulses.     Heart sounds: Normal heart sounds. No murmur heard. Pulmonary:     Effort: Pulmonary effort is normal. No respiratory distress.     Breath sounds: Normal breath sounds. No wheezing or rales.  Abdominal:     General: Bowel sounds are normal. There is no  distension.     Palpations: Abdomen is soft. There is no mass.     Tenderness: There is no abdominal tenderness.  Musculoskeletal:        General: No tenderness. Normal range of motion.     Cervical back: Normal range of motion and neck supple.     Comments: Upper / Lower Extremities: - Normal muscle tone, strength bilateral upper extremities 5/5, lower extremities 5/5  Lymphadenopathy:     Cervical: No cervical adenopathy.  Skin:    General: Skin is warm and dry.     Findings: No erythema or rash.  Neurological:     Mental Status: She is alert and oriented to person, place, and time.     Comments: Distal sensation intact to light touch all extremities  Psychiatric:        Mood and Affect: Mood normal.        Behavior: Behavior normal.        Thought Content: Thought content normal.     Comments: Well groomed, good eye contact, normal speech and thoughts    I have personally reviewed the radiology report from 11/17/21 Mammogram.  CLINICAL DATA:  Screening.   EXAM: DIGITAL SCREENING BILATERAL MAMMOGRAM WITH TOMOSYNTHESIS AND CAD   TECHNIQUE: Bilateral screening digital craniocaudal and mediolateral oblique mammograms were obtained. Bilateral screening digital breast tomosynthesis was performed. The images were evaluated with computer-aided detection.   COMPARISON:  Previous exam(s).   ACR Breast Density Category c: The breast tissue is heterogeneously dense, which may obscure small masses.   FINDINGS: There are no findings suspicious for malignancy.   IMPRESSION: No mammographic evidence of malignancy. A result letter of this screening mammogram will be mailed directly to the patient.   RECOMMENDATION: Screening mammogram in one year. (Code:SM-B-01Y)   BI-RADS CATEGORY  1: Negative.     Electronically Signed   By: Abelardo Diesel M.D.   On: 11/20/2021 07:11  Results for orders placed or performed in visit on 11/24/21  FSH/LH  Result Value Ref Range   LH 6.4  mIU/mL   FSH 4.7 mIU/mL  Estradiol  Result Value Ref Range   Estradiol 83.4 pg/mL  Progesterone  Result Value Ref Range   Progesterone <0.1 ng/mL      Assessment & Plan:   Problem List Items Addressed This Visit     Essential hypertension    Well-controlled HTN - Home BP readings normal No known complications  On med for 10 years without problem  Plan:  1. REDUCE med - Losartan to 25mg  daily (from 50mg ) - future reconsider taper off completely if no longer indicated 2. Encourage improved lifestyle - low sodium diet, improve regular exercise 3. Continue monitor BP outside office, bring readings to next visit, if persistently >140/90 or new symptoms notify office sooner      Relevant Medications   losartan (COZAAR) 25 MG tablet   Other Visit Diagnoses     Annual physical exam    -  Primary       Updated Health Maintenance information Future reconsider Shingrix vaccine or Varicella Ab testing. Next Colonoscopy 2025 Mammogram ordered by GYN Reviewed recent lab results with patient Encouraged improvement to lifestyle with diet and exercise Goal of weight loss  Reconsider anxiety management in future - seems to be manageable clinically right now and not impacting daily function, prior GYN perimenopausal testing was mostly normal, she may follow up with them regarding progesterone otherwise, we can consider therapy for anxiety if needed in future.  Meds ordered this encounter  Medications   losartan (COZAAR) 25 MG tablet    Sig: Take 1 tablet (25 mg total) by mouth daily.    Dispense:  90 tablet    Refill:  3    Dose reduced to 25mg       Follow up plan: Return in about 1 year (around 12/18/2022) for 1 year fasting lab only then 1 week later Annual Physical.  Nobie Putnam, DO [facility] 12/18/2021, 1:48 PM

## 2021-12-18 NOTE — Assessment & Plan Note (Signed)
Well-controlled HTN - Home BP readings normal No known complications  On med for 10 years without problem  Plan:  1. REDUCE med - Losartan to 25mg  daily (from 50mg ) - future reconsider taper off completely if no longer indicated 2. Encourage improved lifestyle - low sodium diet, improve regular exercise 3. Continue monitor BP outside office, bring readings to next visit, if persistently >140/90 or new symptoms notify office sooner

## 2022-04-08 ENCOUNTER — Encounter: Payer: Self-pay | Admitting: Family Medicine

## 2022-04-10 ENCOUNTER — Telehealth (INDEPENDENT_AMBULATORY_CARE_PROVIDER_SITE_OTHER): Payer: Managed Care, Other (non HMO) | Admitting: Family Medicine

## 2022-04-10 ENCOUNTER — Encounter: Payer: Self-pay | Admitting: Family Medicine

## 2022-04-10 VITALS — Ht 68.0 in | Wt 162.0 lb

## 2022-04-10 DIAGNOSIS — N3001 Acute cystitis with hematuria: Secondary | ICD-10-CM | POA: Diagnosis not present

## 2022-04-10 DIAGNOSIS — Z87442 Personal history of urinary calculi: Secondary | ICD-10-CM

## 2022-04-10 DIAGNOSIS — N200 Calculus of kidney: Secondary | ICD-10-CM | POA: Diagnosis not present

## 2022-04-10 DIAGNOSIS — R310 Gross hematuria: Secondary | ICD-10-CM | POA: Diagnosis not present

## 2022-04-10 MED ORDER — TAMSULOSIN HCL 0.4 MG PO CAPS: 0.4000 mg | ORAL_CAPSULE | Freq: Every day | ORAL | 0 refills | Status: DC

## 2022-04-10 MED ORDER — CIPROFLOXACIN HCL 500 MG PO TABS: 500.0000 mg | ORAL_TABLET | Freq: Two times a day (BID) | ORAL | 0 refills | Status: DC

## 2022-04-10 NOTE — Patient Instructions (Addendum)
Thank you for coming to the office today. ? ?[facility]1st floor ?[address] ?[place],  [place]  [address] ?Phone: [phone] ? ?Please schedule a Follow-up Appointment to: Return if symptoms worsen or fail to improve. ? ?If you have any other questions or concerns, please feel free to call the office or send a message through [facility]. You may also schedule an earlier appointment if necessary. ? ?Additionally, you may be receiving a survey about your experience at our office within a few days to 1 week by e-mail or mail. We value your feedback. ? ?Nobie Putnam, DO ?[facility] ?

## 2022-04-10 NOTE — Progress Notes (Signed)
Virtual Visit via Telephone ?The purpose of this virtual visit is to provide medical care while limiting exposure to the novel coronavirus (COVID19) for both patient and office staff. ? ?Consent was obtained for phone visit:  Yes.   ?Answered questions that patient had about telehealth interaction:  Yes.   ?I discussed the limitations, risks, security and privacy concerns of performing an evaluation and management service by telephone. I also discussed with the patient that there may be a patient responsible charge related to this service. The patient expressed understanding and agreed to proceed. ? ?Patient Location: Home ?Provider Location: [facility] (Office) ? ?Participants in virtual visit: ?- Patient: Sheryl Carr ?- CMA: Orinda Kenner, CMA ?- Provider: Dr Parks Ranger ? ?---------------------------------------------------------------------- ?Chief Complaint  ?Patient presents with  ? Urinary Frequency  ? ? ?S: Reviewed CMA documentation. I have called patient and gathered additional HPI as follows: ? ?UTI vs Nephrolithiasis ?Gross Hematuria ? ?Reports that symptoms started since the weekend with mixed urinary symptoms with episodic urgency bladder discomfort, dysuria, and blood in urine with clots. ?Admits bladder spasms ? ?Not endorsing any back or flank pain. ? ?Denies any fevers, chills, sweats, body ache, cough, shortness of breath, sinus pain or pressure, headache, abdominal pain, nausea or vomiting  ? ?Past Medical History:  ?Diagnosis Date  ? Anemia   ? Chronic kidney disease   ? H/O KIDNEY STONES  ? Endometrial polyp 2016  ? hysteroscopically removed  ? GERD (gastroesophageal reflux disease)   ? H/O mammogram 05/11/2015  ? Hematuria   ? History of kidney stones   ? History of uterine fibroid   ? s/p hysterectomy 08/2017  ? HTN (hypertension)   ? PT WAS TAKEN OFF HER BP MED IN MAY/JUNE OF 2016 BUT BP IS ELEVATED AGAIN  ? ?Social History  ? ?Tobacco Use  ? Smoking status:  Former  ?  Types: Cigarettes  ?  Quit date: 09/24/1990  ?  Years since quitting: 31.5  ? Smokeless tobacco: Former  ?Vaping Use  ? Vaping Use: Never used  ?Substance Use Topics  ? Alcohol use: Yes  ?  Alcohol/week: 2.0 - 3.0 standard drinks  ?  Types: 2 - 3 Standard drinks or equivalent per week  ?  Comment: social  ? Drug use: No  ? ? ?Current Outpatient Medications:  ?  ciprofloxacin (CIPRO) 500 MG tablet, Take 1 tablet (500 mg total) by mouth 2 (two) times daily., Disp: 14 tablet, Rfl: 0 ?  tamsulosin (FLOMAX) 0.4 MG CAPS capsule, Take 1 capsule (0.4 mg total) by mouth daily. For kidney stone, Disp: 30 capsule, Rfl: 0 ?  diclofenac Sodium (VOLTAREN) 1 % GEL, Apply 2 g topically 4 (four) times daily as needed., Disp: 100 g, Rfl: 2 ?  econazole nitrate 1 % cream, Apply once a day as needed for rash., Disp: , Rfl: 2 ?  fluticasone (FLONASE) 50 MCG/ACT nasal spray, Place 2 sprays into both nostrils daily. Use for 4-6 weeks then stop and use seasonally or as needed., Disp: 16 g, Rfl: 3 ?  losartan (COZAAR) 25 MG tablet, Take 1 tablet (25 mg total) by mouth daily., Disp: 90 tablet, Rfl: 3 ?  Multiple Vitamins-Calcium (ONE-A-DAY WOMENS FORMULA PO), Take 1 tablet by mouth daily. , Disp: , Rfl:  ? ? ?  12/18/2021  ?  1:40 PM 11/24/2021  ?  8:27 AM 10/24/2020  ? 11:01 AM  ?Depression screen PHQ 2/9  ?Decreased Interest 0 0 0  ?Down,  Depressed, Hopeless 0 0 0  ?PHQ - 2 Score 0 0 0  ?Altered sleeping 1    ?Tired, decreased energy 0    ?Change in appetite 0    ?Feeling bad or failure about yourself  0    ?Trouble concentrating 0    ?Moving slowly or fidgety/restless 0    ?Suicidal thoughts 0    ?PHQ-9 Score 1    ?Difficult doing work/chores Not difficult at all    ? ? ? ?  12/18/2021  ?  1:40 PM  ?GAD 7 : Generalized Anxiety Score  ?Nervous, Anxious, on Edge 1  ?Control/stop worrying 1  ?Worry too much - different things 0  ?Trouble relaxing 0  ?Restless 0  ?Easily annoyed or irritable 1  ?Afraid - awful might happen 0  ?Total  GAD 7 Score 3  ?Anxiety Difficulty Not difficult at all  ? ? ?-------------------------------------------------------------------------- ?O: No physical exam performed due to remote telephone encounter. ? ?Lab results reviewed. ? ?No results found for this or any previous visit (from the past 2160 hour(s)). ? ?-------------------------------------------------------------------------- ?A&P: ? ?Problem List Items Addressed This Visit   ? ? History of nephrolithiasis  ? Relevant Orders  ? Ambulatory referral to Urology  ? Gross hematuria  ? Relevant Orders  ? Urine Culture  ? Urinalysis, Routine w reflex microscopic  ? Ambulatory referral to Urology  ? ?Other Visit Diagnoses   ? ? Nephrolithiasis    -  Primary  ? Relevant Medications  ? tamsulosin (FLOMAX) 0.4 MG CAPS capsule  ? Other Relevant Orders  ? Ambulatory referral to Urology  ? Acute cystitis with hematuria      ? Relevant Medications  ? tamsulosin (FLOMAX) 0.4 MG CAPS capsule  ? ciprofloxacin (CIPRO) 500 MG tablet  ? Other Relevant Orders  ? Urine Culture  ? Urinalysis, Routine w reflex microscopic  ? Ambulatory referral to Urology  ? ?  ? ?Concern for UTI vs Nephrolithiasis ?Discussed difficult to assess virtually without urinalysis ?Prior history UTi and Nephrolithiasis back in 2018-19 had seen ED and Urology has had gross hematuria work up [place] ? ?Now repeat of similar episode ?No acute symptoms of pyelonephritis by report ? ?PCN allergy, cipro has worked before ? ?Will order Flomax for kidney stone possible passing and Cipro for empiric UTI coverage ? ?NSAID PRN pain ? ?Return to office leave urinalysis urine culture tomorrow 5/3 at 8am. ? ?Urgent referral to [facility] for further management ? ?Return precautions if need to go to hospital ED if severe or worsening symptoms. ? ?Orders Placed This Encounter  ?Procedures  ? Urine Culture  ? Urinalysis, Routine w reflex microscopic  ? Ambulatory referral to Urology  ?  Referral Priority:   Urgent  ?  Referral  Type:   Consultation  ?  Referral Reason:   Specialty Services Required  ?  Requested Specialty:   Urology  ?  Number of Visits Requested:   1  ? ? ? ?Meds ordered this encounter  ?Medications  ? tamsulosin (FLOMAX) 0.4 MG CAPS capsule  ?  Sig: Take 1 capsule (0.4 mg total) by mouth daily. For kidney stone  ?  Dispense:  30 capsule  ?  Refill:  0  ? ciprofloxacin (CIPRO) 500 MG tablet  ?  Sig: Take 1 tablet (500 mg total) by mouth 2 (two) times daily.  ?  Dispense:  14 tablet  ?  Refill:  0  ? ? ?Follow-up: ?- Return in 1 day for  urinalysis + urine culture in lab ? ?Patient verbalizes understanding with the above medical recommendations including the limitation of remote medical advice. ? ?Specific follow-up and call-back criteria were given for patient to follow-up or seek medical care more urgently if needed. ? ? ?- Time spent in direct consultation with patient on phone: 15 minutes ? ? ?Nobie Putnam, DO ?[facility] ?[facility] ?04/10/2022, 4:34 PM ? ?

## 2022-04-13 LAB — URINALYSIS, ROUTINE W REFLEX MICROSCOPIC
Bilirubin Urine: NEGATIVE
Glucose, UA: NEGATIVE
Hyaline Cast: NONE SEEN /LPF
Ketones, ur: NEGATIVE
Nitrite: NEGATIVE
RBC / HPF: >=60 /HPF — AB (ref 0–2)
Specific Gravity, Urine: 1.016 (ref 1.001–1.035)
WBC, UA: >=60 /HPF — AB (ref 0–5)
pH: 6 (ref 5.0–8.0)

## 2022-04-13 LAB — URINE CULTURE
MICRO NUMBER:: 13347550
SPECIMEN QUALITY:: ADEQUATE

## 2022-04-13 LAB — MICROSCOPIC MESSAGE

## 2022-04-23 NOTE — Progress Notes (Signed)
? ?04/24/2022 ?9:38 AM  ? ?[name] ?1970/12/13 ?[id number] ? ?Referring provider:  ?Sheryl Hauser, DO ?[address] ?[place],  [place] [address] ?Chief Complaint  ?Patient presents with  ? Nephrolithiasis  ? ? ?HPI: ?Sheryl Carr is a 51 y.o.female who presents today for further evaluation of UTI.  ? ?She has a personal history of nephrolithiasis in the remote past and associated with her pregnancies. She underwent a ureteroscopy in the past.  ? ?She was last seen in clinic in 2019 by Sheryl Council, PA-C. ? ?Her most recent upper tract imagine was in the form of CTU in 2019 and showed no evidence of stone burden.  ? ?She contacted her PCP Sheryl Carr on 04/08/2022. She was experiencing kidney stone pain like previous episode. She was having urgency, discomfort and gross hematuria. She had a video visit on 04/10/2022. Urinalysis showed 3+ hgb, 3+ leukocytes >=60 WBCs and RBCs with few bacteria. Urine culture grew E.coli, she was managed on Cipro.  ? ?UA today shows few epithelial cells but otherwise unremarkable.  ? ?She reports today that when she had this UTI episode her pain felt like when she had a kidney stone she was having urgency, discomfort and gross hematuria. She denies fevers or chills. She reports left flank pain.  ? ?PMH: ?Past Medical History:  ?Diagnosis Date  ? Anemia   ? Chronic kidney disease   ? H/O KIDNEY STONES  ? Endometrial polyp 2016  ? hysteroscopically removed  ? GERD (gastroesophageal reflux disease)   ? H/O mammogram 05/11/2015  ? Hematuria   ? History of kidney stones   ? History of uterine fibroid   ? s/p hysterectomy 08/2017  ? HTN (hypertension)   ? PT WAS TAKEN OFF HER BP MED IN MAY/JUNE OF 2016 BUT BP IS ELEVATED AGAIN  ? ? ?Surgical History: ?Past Surgical History:  ?Procedure Laterality Date  ? ABDOMINAL HYSTERECTOMY    ? BREAST CYST ASPIRATION Right 12/24/2019  ? CESAREAN SECTION    ? COLONOSCOPY WITH PROPOFOL N/A 11/02/2019  ? Procedure: COLONOSCOPY WITH  PROPOFOL;  Surgeon: Sheryl Manifold, MD;  Location: [facility];  Service: Endoscopy;  Laterality: N/A;  ? HYSTERECTOMY ABDOMINAL WITH SALPINGECTOMY Bilateral 08/19/2017  ? Procedure: HYSTERECTOMY ABDOMINAL WITH BILATERAL SALPINGECTOMY;  Surgeon: Sheryl Maid, MD;  Location: [facility];  Service: Gynecology;  Laterality: Bilateral;  ? HYSTEROSCOPY WITH D & C N/A 11/14/2015  ? Procedure: DILATATION AND CURETTAGE /HYSTEROSCOPY;  Surgeon: Sheryl Maid, MD;  Location: [facility] OR[facility];  Service: Gynecology;  Laterality: N/A;  ? KIDNEY STONE SURGERY    ? ? ?Home Medications:  ?Allergies as of 04/24/2022   ? ?   Reactions  ? Pollen Extract   ? itchy eyes, dull headaches, runny nose  ? Penicillins Rash  ? Has patient had a PCN reaction causing immediate rash, facial/tongue/throat swelling, SOB or lightheadedness with hypotension: No ?Has patient had a PCN reaction causing severe rash involving mucus membranes or skin necrosis: No ?Has patient had a PCN reaction that required hospitalization: No ?Has patient had a PCN reaction occurring within the last 10 years: No ?If all of the above answers are "NO", then may proceed with Cephalosporin use.  ? ?  ? ?  ?Medication List  ?  ? ?  ? Accurate as of Apr 24, 2022  9:38 AM. If you have any questions, ask your nurse or doctor.  ?  ?  ? ?  ? ?STOP taking these medications   ? ?  ciprofloxacin 500 MG tablet ?Commonly known as: Cipro ?  ?tamsulosin 0.4 MG Caps capsule ?Commonly known as: FLOMAX ?  ? ?  ? ?TAKE these medications   ? ?diclofenac Sodium 1 % Gel ?Commonly known as: Voltaren ?Apply 2 g topically 4 (four) times daily as needed. ?  ?econazole nitrate 1 % cream ?Apply once a day as needed for rash. ?  ?fluticasone 50 MCG/ACT nasal spray ?Commonly known as: FLONASE ?Place 2 sprays into both nostrils daily. Use for 4-6 weeks then stop and use seasonally or as needed. ?  ?losartan 25 MG tablet ?Commonly known as: COZAAR ?Take 1 tablet (25 mg total) by mouth daily. ?  ?ONE-A-DAY  WOMENS FORMULA PO ?Take 1 tablet by mouth daily. ?  ? ?  ? ? ?Allergies:  ?Allergies  ?Allergen Reactions  ? Pollen Extract   ?  itchy eyes, dull headaches, runny nose  ? Penicillins Rash  ?  Has patient had a PCN reaction causing immediate rash, facial/tongue/throat swelling, SOB or lightheadedness with hypotension: No ?Has patient had a PCN reaction causing severe rash involving mucus membranes or skin necrosis: No ?Has patient had a PCN reaction that required hospitalization: No ?Has patient had a PCN reaction occurring within the last 10 years: No ?If all of the above answers are "NO", then may proceed with Cephalosporin use. ?  ? ? ?Family History: ?Family History  ?Problem Relation Age of Onset  ? Diabetes Mellitus I Mother   ? Diabetes Mellitus I Maternal Grandmother   ? Prostate cancer Maternal Grandfather   ? Diabetes Mellitus I Paternal Grandmother   ? Breast cancer Neg Hx   ? ? ?Social History:  reports that she quit smoking about 31 years ago. Her smoking use included cigarettes. She has quit using smokeless tobacco. She reports current alcohol use of about 2.0 - 3.0 standard drinks per week. She reports that she does not use drugs. ? ? ?Physical Exam: ?BP 138/86   Pulse 73   Ht '5\' 8"'$  (1.727 m)   Wt 163 lb (73.9 kg)   LMP 08/09/2017 (Exact Date)   BMI 24.78 kg/m?   ?Constitutional:  Alert and oriented, No acute distress. ?HEENT: [place] AT, moist mucus membranes.  Trachea midline, no masses. ?Cardiovascular: No clubbing, cyanosis, or edema. ?Respiratory: Normal respiratory effort, no increased work of breathing. ?Skin: No rashes, bruises or suspicious lesions. ?Neurologic: Grossly intact, no focal deficits, moving all 4 extremities. ?Psychiatric: Normal mood and affect. ? ?Laboratory Data: ? ?Lab Results  ?Component Value Date  ? CREATININE 0.70 10/23/2021  ? ?Lab Results  ?Component Value Date  ? HGBA1C 5.0 10/23/2021  ? ? ?Urinalysis ?A few epithelial cells but otherwise unremarkable.  ? ? ?Assessment  & Plan:   ?UTI  ?- Urine today is unremarkable  ?- Discussed with her that her symptoms were consistent with UTI  ?- Recommend she follow-up if she has UTI symptoms  ?-Given that her urinary tract infections are infrequent, no further intervention but may consider supplementation and/or estrogen if they continue  ? ?2. History of nephrolithiasis and flank pain  ?- Will monitor with KUB   ?- We discussed general stone prevention techniques including drinking plenty water with goal of producing 2.5 L urine daily, increased citric acid intake, avoidance of high oxalate containing foods, and decreased salt intake.  Information about dietary recommendations given today.  ? ?F/u prn ? ?Conley Rolls as a scribe for Hollice Espy, MD.,have documented all relevant documentation on the behalf of  Hollice Espy, MD,as directed by  Hollice Espy, MD while in the presence of Hollice Espy, MD. ? ?I have reviewed the above documentation for accuracy and completeness, and I agree with the above.  ? ?Hollice Espy, MD ? ? ?[facility] ?[address], [address] ?[place], [place] [address] ?[phone]

## 2022-04-24 ENCOUNTER — Ambulatory Visit
Admission: RE | Admit: 2022-04-24 | Discharge: 2022-04-24 | Disposition: A | Discharge status: Home / Self Care | Payer: 59 | Source: Ambulatory Visit | Attending: Urology | Admitting: Urology

## 2022-04-24 ENCOUNTER — Ambulatory Visit
Admission: RE | Admit: 2022-04-24 | Discharge: 2022-04-24 | Disposition: A | Discharge status: Home / Self Care | Payer: 59 | Attending: Urology | Admitting: Urology

## 2022-04-24 ENCOUNTER — Ambulatory Visit: Payer: Managed Care, Other (non HMO) | Admitting: Urology

## 2022-04-24 VITALS — BP 138/86 | HR 73 | Ht 68.0 in | Wt 163.0 lb

## 2022-04-24 DIAGNOSIS — Z87442 Personal history of urinary calculi: Secondary | ICD-10-CM | POA: Diagnosis not present

## 2022-04-24 DIAGNOSIS — R3915 Urgency of urination: Secondary | ICD-10-CM

## 2022-04-24 DIAGNOSIS — R109 Unspecified abdominal pain: Secondary | ICD-10-CM

## 2022-04-24 DIAGNOSIS — N200 Calculus of kidney: Secondary | ICD-10-CM

## 2022-04-24 DIAGNOSIS — R310 Gross hematuria: Secondary | ICD-10-CM | POA: Diagnosis not present

## 2022-04-24 LAB — URINALYSIS, COMPLETE
Bilirubin, UA: NEGATIVE
Glucose, UA: NEGATIVE
Ketones, UA: NEGATIVE
Leukocytes,UA: NEGATIVE
Nitrite, UA: NEGATIVE
Protein,UA: NEGATIVE
RBC, UA: NEGATIVE
Specific Gravity, UA: 1.025 (ref 1.005–1.030)
Urobilinogen, Ur: 0.2 mg/dL (ref 0.2–1.0)
pH, UA: 5.5 (ref 5.0–7.5)

## 2022-04-24 LAB — MICROSCOPIC EXAMINATION: Epithelial Cells (non renal): >10 /hpf — ABNORMAL HIGH (ref 0–10)

## 2022-04-25 ENCOUNTER — Telehealth: Payer: Self-pay | Admitting: *Deleted

## 2022-04-25 NOTE — Telephone Encounter (Addendum)
Left VM to return call ? ? ?----- Message from Hollice Espy, MD sent at 04/25/2022  1:06 PM EDT ----- ?There is a very small nonobstructing stone in your left kidney which should not be causing pain.  Generally, would recommend observation for the stones rather than surgery or intervention.  If you do think that it is causing you pain or would like to discuss treating it, we can arrange for follow-up to go into more details, in person or virtual.  There were no ureteral or obstructing stones which is great news.   ? ?Hollice Espy, MD ? ?

## 2022-05-23 NOTE — Telephone Encounter (Signed)
Patient informed, voiced understanding.  °

## 2022-06-22 ENCOUNTER — Ambulatory Visit: Payer: Managed Care, Other (non HMO) | Admitting: Physician Assistant

## 2022-06-22 VITALS — BP 134/86 | HR 74 | Temp 97.1°F | Wt 164.0 lb

## 2022-06-22 DIAGNOSIS — Z87442 Personal history of urinary calculi: Secondary | ICD-10-CM | POA: Diagnosis not present

## 2022-06-22 DIAGNOSIS — N3090 Cystitis, unspecified without hematuria: Secondary | ICD-10-CM

## 2022-06-22 LAB — POCT URINALYSIS DIPSTICK
Bilirubin, UA: NEGATIVE
Glucose, UA: NEGATIVE
Ketones, UA: NEGATIVE
Leukocytes, UA: NEGATIVE
Nitrite, UA: NEGATIVE
Protein, UA: POSITIVE — AB
Spec Grav, UA: 1.02 (ref 1.010–1.025)
Urobilinogen, UA: 0.2 E.U./dL
pH, UA: 6 (ref 5.0–8.0)

## 2022-06-22 NOTE — Progress Notes (Deleted)
Established Patient Office Visit  Name: Sheryl Carr   MRN: [id number]    DOB: 01/04/1971   Date:06/22/2022  Today's Provider: Talitha Givens, MHS, PA-C Introduced myself to the patient as a PA-C and provided education on APPs in clinical practice.         Subjective  Chief Complaint  No chief complaint on file.   HPI   Patient Active Problem List   Diagnosis Date Noted   Screening for colon cancer    Polyp of colon    Chronic diarrhea 09/03/2019   Gross hematuria 07/02/2017   History of nephrolithiasis 07/02/2017   Anemia 11/12/2016   Essential hypertension 11/10/2015   GERD (gastroesophageal reflux disease) 08/03/2015   Abdominal pain 05/23/2015   Anxiety 05/23/2015   Allergic contact dermatitis 05/23/2015   Elevated LDL cholesterol level 05/23/2015   Fatigue 05/23/2015   Brash 05/23/2015   Breast lump 05/23/2015   Muscle spasms of neck 05/23/2015   Neuropathy 05/23/2015   Pharyngeal inflammation 05/23/2015   Skin lesion 05/23/2015   Pain in limb 09/24/2013   Numbness 09/24/2013    Past Surgical History:  Procedure Laterality Date   ABDOMINAL HYSTERECTOMY     BREAST CYST ASPIRATION Right 12/24/2019   CESAREAN SECTION     COLONOSCOPY WITH PROPOFOL N/A 11/02/2019   Procedure: COLONOSCOPY WITH PROPOFOL;  Surgeon: Virgel Manifold, MD;  Location: [facility];  Service: Endoscopy;  Laterality: N/A;   HYSTERECTOMY ABDOMINAL WITH SALPINGECTOMY Bilateral 08/19/2017   Procedure: HYSTERECTOMY ABDOMINAL WITH BILATERAL SALPINGECTOMY;  Surgeon: [name], MD;  Location: [facility];  Service: Gynecology;  Laterality: Bilateral;   HYSTEROSCOPY WITH D & C N/A 11/14/2015   Procedure: DILATATION AND CURETTAGE /HYSTEROSCOPY;  Surgeon: [name], MD;  Location: [facility];  Service: Gynecology;  Laterality: N/A;   KIDNEY STONE SURGERY      Family History  Problem Relation Age of Onset   Diabetes Mellitus I Mother    Diabetes Mellitus I Maternal  Grandmother    Prostate cancer Maternal Grandfather    Diabetes Mellitus I Paternal Grandmother    Breast cancer Neg Hx     Social History   Tobacco Use   Smoking status: Former    Types: Cigarettes    Quit date: 09/24/1990    Years since quitting: 31.7   Smokeless tobacco: Former  Substance Use Topics   Alcohol use: Yes    Alcohol/week: 2.0 - 3.0 standard drinks of alcohol    Types: 2 - 3 Standard drinks or equivalent per week    Comment: social     Current Outpatient Medications:    diclofenac Sodium (VOLTAREN) 1 % GEL, Apply 2 g topically 4 (four) times daily as needed., Disp: 100 g, Rfl: 2   econazole nitrate 1 % cream, Apply once a day as needed for rash., Disp: , Rfl: 2   fluticasone (FLONASE) 50 MCG/ACT nasal spray, Place 2 sprays into both nostrils daily. Use for 4-6 weeks then stop and use seasonally or as needed., Disp: 16 g, Rfl: 3   losartan (COZAAR) 25 MG tablet, Take 1 tablet (25 mg total) by mouth daily., Disp: 90 tablet, Rfl: 3   Multiple Vitamins-Calcium (ONE-A-DAY WOMENS FORMULA PO), Take 1 tablet by mouth daily. , Disp: , Rfl:   Allergies  Allergen Reactions   Pollen Extract     itchy eyes, dull headaches, runny nose   Penicillins Rash    Has patient had a PCN reaction  causing immediate rash, facial/tongue/throat swelling, SOB or lightheadedness with hypotension: No Has patient had a PCN reaction causing severe rash involving mucus membranes or skin necrosis: No Has patient had a PCN reaction that required hospitalization: No Has patient had a PCN reaction occurring within the last 10 years: No If all of the above answers are "NO", then may proceed with Cephalosporin use.     I personally reviewed {Reviewed:[id number]} with the patient/caregiver today.   ROS    Objective  There were no vitals filed for this visit.  There is no height or weight on file to calculate BMI.  Physical Exam   Recent Results (from the past 2160 hour(s))  Urine Culture      Status: Abnormal   Collection Time: 04/11/22  8:37 AM   Specimen: Urine  Result Value Ref Range   MICRO NUMBER: [id number]    SPECIMEN QUALITY: Adequate    Sample Source URINE, CLEAN CATCH    STATUS: FINAL    ISOLATE 1: Escherichia coli (A)     Comment: Greater than 100,000 CFU/mL of Escherichia coli      Susceptibility   Escherichia coli - URINE CULTURE, REFLEX    AMOX/CLAVULANIC <=2 Sensitive     AMPICILLIN 4 Sensitive     AMPICILLIN/SULBACTAM <=2 Sensitive     CEFAZOLIN* <=4 Not Reportable      * For infections other than uncomplicated UTI caused by E. coli, K. pneumoniae or P. mirabilis: Cefazolin is resistant if MIC > or = 8 mcg/mL. (Distinguishing susceptible versus intermediate for isolates with MIC < or = 4 mcg/mL requires additional testing.) For uncomplicated UTI caused by E. coli, K. pneumoniae or P. mirabilis: Cefazolin is susceptible if MIC <32 mcg/mL and predicts susceptible to the oral agents cefaclor, cefdinir, cefpodoxime, cefprozil, cefuroxime, cephalexin and loracarbef.     CEFTAZIDIME <=1 Sensitive     CEFEPIME <=1 Sensitive     CEFTRIAXONE <=1 Sensitive     CIPROFLOXACIN <=0.25 Sensitive     LEVOFLOXACIN <=0.12 Sensitive     GENTAMICIN <=1 Sensitive     IMIPENEM <=0.25 Sensitive     NITROFURANTOIN 32 Sensitive     PIP/TAZO <=4 Sensitive     TOBRAMYCIN <=1 Sensitive     TRIMETH/SULFA* <=20 Sensitive      * For infections other than uncomplicated UTI caused by E. coli, K. pneumoniae or P. mirabilis: Cefazolin is resistant if MIC > or = 8 mcg/mL. (Distinguishing susceptible versus intermediate for isolates with MIC < or = 4 mcg/mL requires additional testing.) For uncomplicated UTI caused by E. coli, K. pneumoniae or P. mirabilis: Cefazolin is susceptible if MIC <32 mcg/mL and predicts susceptible to the oral agents cefaclor, cefdinir, cefpodoxime, cefprozil, cefuroxime, cephalexin and loracarbef. Legend: S = Susceptible  I = Intermediate R =  Resistant  NS = Not susceptible * = Not tested  NR = Not reported **NN = See antimicrobic comments   Urinalysis, Routine w reflex microscopic     Status: Abnormal   Collection Time: 04/11/22  8:37 AM  Result Value Ref Range   Color, Urine YELLOW YELLOW   APPearance CLOUDY (A) CLEAR   Specific Gravity, Urine 1.016 1.001 - 1.035   pH 6.0 5.0 - 8.0   Glucose, UA NEGATIVE NEGATIVE   Bilirubin Urine NEGATIVE NEGATIVE   Ketones, ur NEGATIVE NEGATIVE   Hgb urine dipstick 3+ (A) NEGATIVE   Protein, ur TRACE (A) NEGATIVE   Nitrite NEGATIVE NEGATIVE   Leukocytes,Ua 3+ (A) NEGATIVE   WBC,  UA > OR = 60 (A) 0 - 5 /HPF   RBC / HPF > OR = 60 (A) 0 - 2 /HPF   Squamous Epithelial / LPF 0-5 < OR = 5 /HPF   Bacteria, UA FEW (A) NONE SEEN /HPF   Hyaline Cast NONE SEEN NONE SEEN /LPF  MICROSCOPIC MESSAGE     Status: None   Collection Time: 04/11/22  8:37 AM  Result Value Ref Range   Note      Comment: This urine was analyzed for the presence of WBC,  RBC, bacteria, casts, and other formed elements.  Only those elements seen were reported. . .   Urinalysis, Complete     Status: Abnormal   Collection Time: 04/24/22  8:22 AM  Result Value Ref Range   Specific Gravity, UA 1.025 1.005 - 1.030   pH, UA 5.5 5.0 - 7.5   Color, UA Yellow Yellow   Appearance Ur Cloudy (A) Clear   Leukocytes,UA Negative Negative   Protein,UA Negative Negative/Trace   Glucose, UA Negative Negative   Ketones, UA Negative Negative   RBC, UA Negative Negative   Bilirubin, UA Negative Negative   Urobilinogen, Ur 0.2 0.2 - 1.0 mg/dL   Nitrite, UA Negative Negative   Microscopic Examination See below:   Microscopic Examination     Status: Abnormal   Collection Time: 04/24/22  8:22 AM   Urine  Result Value Ref Range   WBC, UA 0-5 0 - 5 /hpf   RBC, Urine 0-2 0 - 2 /hpf   Epithelial Cells (non renal) >10 (H) 0 - 10 /hpf   Bacteria, UA Many (A) None seen/Few     PHQ2/9:    12/18/2021    1:40 PM 11/24/2021    8:27  AM 10/24/2020   11:01 AM 09/03/2019   11:25 AM 10/21/2018    9:09 AM  Depression screen PHQ 2/9  Decreased Interest 0 0 0 0 0  Down, Depressed, Hopeless 0 0 0 0 0  PHQ - 2 Score 0 0 0 0 0  Altered sleeping 1      Tired, decreased energy 0      Change in appetite 0      Feeling bad or failure about yourself  0      Trouble concentrating 0      Moving slowly or fidgety/restless 0      Suicidal thoughts 0      PHQ-9 Score 1      Difficult doing work/chores Not difficult at all          Fall Risk:    12/18/2021    1:39 PM 11/24/2021    8:27 AM 10/24/2020   11:01 AM 09/03/2019   11:25 AM 10/21/2018    9:09 AM  Fall Risk   Falls in the past year? 0 0 0 0 0  Number falls in past yr: 0 0 0 0   Injury with Fall? 0 0 0    Risk for fall due to : No Fall Risks      Follow up Falls evaluation completed  Falls evaluation completed Falls evaluation completed Falls evaluation completed      Functional Status Survey:      Assessment & Plan

## 2022-06-22 NOTE — Assessment & Plan Note (Signed)
Appears recurrent  She is followed by Urology Most recent imaging revealed 4 mm stone in left kidney in may 2023 She is having some discomfort and pressure along with increased urinary urgency so stone may be moving  Will continue to coordinate with urology

## 2022-06-22 NOTE — Patient Instructions (Signed)
Please stay well hydrated and avoid holding your urine  If you notice increased pain, blood in your urine, fever, difficulty urinating please go to the ED We will keep you updated on the results of the urine culture and if further intervention is indicated

## 2022-06-22 NOTE — Progress Notes (Signed)
Acute Office Visit   Patient: Sheryl Carr   DOB: 1971-03-19   51 y.o. Female  MRN: [id number] Visit Date: 06/22/2022  Today's healthcare provider: Dani Gobble Isaia Hassell, PA-C  Introduced myself to the patient as a Journalist, newspaper and provided education on APPs in clinical practice.    Chief Complaint  Patient presents with   Recurrent UTI   Subjective    HPI   States this started yesterday and is having some burning and urgency She has not noticed gross hematuria  She reports some pain on the left abdomen  She states she has had kidney stones before and this feels mildly similar She denies fevers, current flank pain, colicky abdominal pain She is established with Urology for monitoring    Medications: Outpatient Medications Prior to Visit  Medication Sig   diclofenac Sodium (VOLTAREN) 1 % GEL Apply 2 g topically 4 (four) times daily as needed.   econazole nitrate 1 % cream Apply once a day as needed for rash.   fluticasone (FLONASE) 50 MCG/ACT nasal spray Place 2 sprays into both nostrils daily. Use for 4-6 weeks then stop and use seasonally or as needed.   losartan (COZAAR) 25 MG tablet Take 1 tablet (25 mg total) by mouth daily.   Multiple Vitamins-Calcium (ONE-A-DAY WOMENS FORMULA PO) Take 1 tablet by mouth daily.    No facility-administered medications prior to visit.    Review of Systems  Constitutional:  Negative for chills and fever.  Gastrointestinal:  Positive for abdominal pain. Negative for diarrhea, nausea and vomiting.  Genitourinary:  Positive for dysuria, frequency and urgency. Negative for difficulty urinating, enuresis, flank pain and hematuria.       Objective    BP 134/86 (BP Location: Right Arm, Patient Position: Sitting, Cuff Size: Normal)   Pulse 74   Temp (!) 97.1 F (36.2 C) (Temporal)   Wt 164 lb (74.4 kg)   LMP 08/09/2017 (Exact Date)   SpO2 99%   BMI 24.94 kg/m    Physical Exam Vitals reviewed.  Constitutional:      General: She  is awake.     Appearance: Normal appearance. She is well-developed, well-groomed and normal weight.  HENT:     Head: Normocephalic and atraumatic.  Cardiovascular:     Rate and Rhythm: Normal rate and regular rhythm.     Pulses: Normal pulses.     Heart sounds: No murmur heard.    No gallop.  Pulmonary:     Effort: Pulmonary effort is normal.  Abdominal:     General: Abdomen is flat. Bowel sounds are normal.     Palpations: Abdomen is soft.     Tenderness: There is no abdominal tenderness. There is no right CVA tenderness or left CVA tenderness.     Comments: Reports some sensations of pressure over bladder/suprapubic space   Neurological:     General: No focal deficit present.     Mental Status: She is alert and oriented to person, place, and time.  Psychiatric:        Mood and Affect: Mood normal.        Behavior: Behavior normal. Behavior is cooperative.        Thought Content: Thought content normal.        Judgment: Judgment normal.       Results for orders placed or performed in visit on 06/22/22  POCT urinalysis dipstick  Result Value Ref Range   Color, UA  Clarity, UA     Glucose, UA Negative Negative   Bilirubin, UA Negative    Ketones, UA Negative    Spec Grav, UA 1.020 1.010 - 1.025   Blood, UA Moderate    pH, UA 6.0 5.0 - 8.0   Protein, UA Positive (A) Negative   Urobilinogen, UA 0.2 0.2 or 1.0 E.U./dL   Nitrite, UA Negative    Leukocytes, UA Negative Negative   Appearance     Odor      Assessment & Plan      No follow-ups on file.      Problem List Items Addressed This Visit       Other   History of nephrolithiasis    Appears recurrent  She is followed by Urology Most recent imaging revealed 4 mm stone in left kidney in may 2023 She is having some discomfort and pressure along with increased urinary urgency so stone may be moving  Will continue to coordinate with urology       Other Visit Diagnoses     Cystitis    -  Primary Acute,  recurrent concern  States she is unsure if this is related to UTI or kidney stone passing as they have both felt similar in the past UA was positive for blood and trace protein which is likely more consistent with nephrolithiasis rather than UTI Will send for urine culture for rule out-results to dictate further management  Offered Pyridium to assist with dysuria- patient declined stating she would prefer to stick with Tylenol Reviewed ED and return precautions Encouraged her to stay well hydrated and avoid holding urine. Follow up as needed.    Relevant Orders   POCT urinalysis dipstick (Completed)   Urine Culture        No follow-ups on file.   I, Alyona Romack E Samiyah Stupka, PA-C, have reviewed all documentation for this visit. The documentation on 06/22/22 for the exam, diagnosis, procedures, and orders are all accurate and complete.   Talitha Givens, MHS, PA-C [facility]

## 2022-06-23 LAB — URINE CULTURE
MICRO NUMBER:: 13650230
SPECIMEN QUALITY:: ADEQUATE

## 2022-07-11 ENCOUNTER — Encounter: Payer: Self-pay | Admitting: Family Medicine

## 2022-07-11 ENCOUNTER — Ambulatory Visit: Payer: Managed Care, Other (non HMO) | Admitting: Family Medicine

## 2022-07-11 VITALS — BP 147/91 | HR 68 | Ht 68.0 in | Wt 163.2 lb

## 2022-07-11 DIAGNOSIS — R339 Retention of urine, unspecified: Secondary | ICD-10-CM

## 2022-07-11 DIAGNOSIS — N3281 Overactive bladder: Secondary | ICD-10-CM | POA: Diagnosis not present

## 2022-07-11 DIAGNOSIS — R350 Frequency of micturition: Secondary | ICD-10-CM | POA: Diagnosis not present

## 2022-07-11 LAB — POCT URINALYSIS DIPSTICK
Bilirubin, UA: NEGATIVE
Blood, UA: NEGATIVE
Glucose, UA: NEGATIVE
Ketones, UA: NEGATIVE
Leukocytes, UA: NEGATIVE
Nitrite, UA: NEGATIVE
Protein, UA: POSITIVE — AB
Spec Grav, UA: 1.02 (ref 1.010–1.025)
Urobilinogen, UA: 0.2 E.U./dL
pH, UA: 5 (ref 5.0–8.0)

## 2022-07-11 MED ORDER — OXYBUTYNIN CHLORIDE 5 MG PO TABS: 5.0000 mg | ORAL_TABLET | Freq: Three times a day (TID) | ORAL | 1 refills | Status: DC | PRN

## 2022-07-11 NOTE — Patient Instructions (Addendum)
Thank you for coming to the office today.  Try the medication Oxybutynin as needed for bladder spasms / urgency - (overactive bladder)  Take 5 mg tab as needed every 8 hours, max 3 times a day. If you don't need, can skip it. Caution variety of side effects, dry mouth, dizzy, constipation.  Future if not improving still, would recommend notify Dr Erlene Quan for further evaluation and consider pelvic floor therapy.  Today we talked about ways to manage bladder urgency such as altering your diet to avoid irritative beverages and foods (bladder diet) as well as attempting to decrease stress and other exacerbating factors. Goal is 60 oz water.    The Most Bothersome Foods* The Least Bothersome Foods*  Coffee - Regular & Decaf Tea - caffeinated Carbonated beverages - cola, non-colas, diet & caffeine-free Alcohols - Beer, Red Wine, White Wine, Champagne Fruits - Grapefruit, [place], Orange, [employer] - Cranberry, Grapefruit, Orange, Pineapple Vegetables - Tomato & Tomato Products Flavor Enhancers - Hot peppers, Spicy foods, Chili, Horseradish, Vinegar, Monosodium glutamate (MSG) Artificial Sweeteners - NutraSweet, Sweet 'N Low, Equal (sweetener), Saccharin Ethnic foods - [place], [place], [place] food [employer] Scripts - low-fat & whole Fruits - Bananas, Blueberries, [place] melon, Pears, Raisins, Watermelon Vegetables - Broccoli, Brussels Sprouts, [place], Carrots, Cauliflower, [place], Cucumber, Mushrooms, Peas, Radishes, Squash, Zucchini, White potatoes, Sweet potatoes & yams Poultry - Chicken, Eggs, [place], [employer] - Beef, Programmer, multimedia, Lamb Seafood - Shrimp, [place] fish, Salmon Grains - Oat, Rice Snacks - Pretzels, Popcorn  *Lissa Morales et al. Diet and its role in interstitial cystitis/bladder pain syndrome (IC/BPS) and comorbid conditions. [place] 2012 Jan 11.     Please schedule a Follow-up Appointment to: Return if symptoms worsen or fail to improve.  If you have any  other questions or concerns, please feel free to call the office or send a message through [place]. You may also schedule an earlier appointment if necessary.  Additionally, you may be receiving a survey about your experience at our office within a few days to 1 week by e-mail or mail. We value your feedback.  Nobie Putnam, DO [facility]

## 2022-07-11 NOTE — Progress Notes (Signed)
Subjective:    Patient ID: Sheryl Carr, female    DOB: Sep 04, 1971, 51 y.o.   MRN: [id number]  Sheryl Carr is a 51 y.o. female presenting on 07/11/2022 for Follow-up Urinary Frequency   HPI  Urinary Frequency / Urge / Overactive Bladder  History since 04/10/22 seen by me we treated her for UTI and for kidney stone. She was given Cipro and Flomax She was referred to Urology for kidney stone  Her UTI was resolved with Cipro. She has completed the Flomax. Stone did not pass She saw Dr Erlene Quan 04/24/22 - urine was resolved from infection. KUB X-ray showed 4 mm stone L should pass or not cause her symptoms.  Reassurance no treatment for this at that time.  She returned 06/22/22 saw Junie Panning Mecum PA here for same issue with urinary frequency and UA showed some blood and trace urine. Urine culture negative, no antibiotics.  Today still has same sense of urinary urgency, and frequency. She says this symptom is present about 80% of the time. She has the sensation to go but the urine volume is not increased. If she returns to bathroom to void she will not have large amount of urine.  She drinks caffeine coffee 8oz cup daily, infrequent alcohol.  Denies dysuria, fever chills, nausea vomiting, back pain flank pain, hematuria       12/18/2021    1:40 PM 11/24/2021    8:27 AM 10/24/2020   11:01 AM  Depression screen PHQ 2/9  Decreased Interest 0 0 0  Down, Depressed, Hopeless 0 0 0  PHQ - 2 Score 0 0 0  Altered sleeping 1    Tired, decreased energy 0    Change in appetite 0    Feeling bad or failure about yourself  0    Trouble concentrating 0    Moving slowly or fidgety/restless 0    Suicidal thoughts 0    PHQ-9 Score 1    Difficult doing work/chores Not difficult at all      Social History   Tobacco Use   Smoking status: Former    Types: Cigarettes    Quit date: 09/24/1990    Years since quitting: 31.8   Smokeless tobacco: Former  Scientific laboratory technician Use: Never  used  Substance Use Topics   Alcohol use: Yes    Alcohol/week: 2.0 - 3.0 standard drinks of alcohol    Types: 2 - 3 Standard drinks or equivalent per week    Comment: social   Drug use: No    Review of Systems Per HPI unless specifically indicated above     Objective:    BP (!) 147/91   Pulse 68   Ht '5\' 8"'$  (1.727 m)   Wt 163 lb 3.2 oz (74 kg)   LMP 08/09/2017 (Exact Date)   SpO2 99%   BMI 24.81 kg/m   Wt Readings from Last 3 Encounters:  07/11/22 163 lb 3.2 oz (74 kg)  06/22/22 164 lb (74.4 kg)  04/24/22 163 lb (73.9 kg)    Physical Exam Vitals and nursing note reviewed.  Constitutional:      General: She is not in acute distress.    Appearance: Normal appearance. She is well-developed. She is not diaphoretic.     Comments: Well-appearing, comfortable, cooperative  HENT:     Head: Normocephalic and atraumatic.  Eyes:     General:        Right eye: No discharge.  Left eye: No discharge.     Conjunctiva/sclera: Conjunctivae normal.  Cardiovascular:     Rate and Rhythm: Normal rate.  Pulmonary:     Effort: Pulmonary effort is normal.  Skin:    General: Skin is warm and dry.     Findings: No erythema or rash.  Neurological:     Mental Status: She is alert and oriented to person, place, and time.  Psychiatric:        Mood and Affect: Mood normal.        Behavior: Behavior normal.        Thought Content: Thought content normal.     Comments: Well groomed, good eye contact, normal speech and thoughts     Abdomen 1 view (KUB)Performed 04/24/2022 Final result  Study Result CLINICAL DATA: History of kidney stones.  EXAM: ABDOMEN - 1 VIEW  COMPARISON: CT 09/22/2018.  FINDINGS: 4 mm stone noted over the left kidney. No evidence of ureteral stone. No bowel distention. Lumbar scoliosis concave right. Small sclerotic density again noted over the left acetabulum, most consistent with benign bone island. Degenerative changes lumbar spine and both  hips.  IMPRESSION: 4 mm stone noted over the left kidney. No evidence of ureteral stone.   Electronically Signed By: Marcello Moores Register M.D. On: 04/25/2022 09:44     Results for orders placed or performed in visit on 07/11/22  POCT Urinalysis Dipstick  Result Value Ref Range   Color, UA Yellow    Clarity, UA Clear    Glucose, UA Negative Negative   Bilirubin, UA Negative    Ketones, UA Negative    Spec Grav, UA 1.020 1.010 - 1.025   Blood, UA Negative    pH, UA 5.0 5.0 - 8.0   Protein, UA Positive (A) Negative   Urobilinogen, UA 0.2 0.2 or 1.0 E.U./dL   Nitrite, UA Negative    Leukocytes, UA Negative Negative   Appearance     Odor        Assessment & Plan:   Problem List Items Addressed This Visit   None Visit Diagnoses     OAB (overactive bladder)    -  Primary   Relevant Medications   oxybutynin (DITROPAN) 5 MG tablet   Incomplete bladder emptying       Relevant Medications   oxybutynin (DITROPAN) 5 MG tablet   Urinary frequency       Relevant Orders   POCT Urinalysis Dipstick (Completed)       Discussion on functional bladder symptoms, OAB / urgency frequency Today no sign of UTI. Previously has been treated E Coli UTI, last culture < 1 month ago negative No further antibiotics. Not consistent w/ infection No evidence of kidney stone causing symptomatic concerns at this time. Reassurance today. Prior Urology eval reviewed.  Try the medication Oxybutynin as needed for bladder spasms / urgency - (overactive bladder)  Take 5 mg tab as needed every 8 hours, max 3 times a day. If you don't need, can skip it. Caution variety of side effects, dry mouth, dizzy, constipation.  Future if not improving still, would recommend notify Dr Erlene Quan for further evaluation and consider pelvic floor therapy.  AVS w/ further information.  Meds ordered this encounter  Medications   oxybutynin (DITROPAN) 5 MG tablet    Sig: Take 1 tablet (5 mg total) by mouth every 8  (eight) hours as needed for bladder spasms.    Dispense:  60 tablet    Refill:  1  Follow up plan: Return if symptoms worsen or fail to improve.    Nobie Putnam, DO [facility] 07/11/2022, 2:54 PM

## 2022-11-19 ENCOUNTER — Ambulatory Visit
Admission: RE | Admit: 2022-11-19 | Discharge: 2022-11-19 | Disposition: A | Discharge status: Home / Self Care | Payer: 59 | Source: Ambulatory Visit | Attending: Obstetrics and Gynecology | Admitting: Obstetrics and Gynecology

## 2022-11-19 DIAGNOSIS — Z1231 Encounter for screening mammogram for malignant neoplasm of breast: Secondary | ICD-10-CM | POA: Insufficient documentation

## 2022-11-19 DIAGNOSIS — Z01419 Encounter for gynecological examination (general) (routine) without abnormal findings: Secondary | ICD-10-CM | POA: Diagnosis not present

## 2022-11-23 ENCOUNTER — Telehealth: Payer: Self-pay

## 2022-11-23 DIAGNOSIS — I10 Essential (primary) hypertension: Secondary | ICD-10-CM

## 2022-11-23 DIAGNOSIS — E7800 Pure hypercholesterolemia, unspecified: Secondary | ICD-10-CM

## 2022-11-23 DIAGNOSIS — Z0000 Encounter for general adult medical examination without abnormal findings: Secondary | ICD-10-CM

## 2022-11-23 DIAGNOSIS — R7309 Other abnormal glucose: Secondary | ICD-10-CM

## 2022-11-23 NOTE — Telephone Encounter (Signed)
Copied from [place] [id number]. Topic: General - Other >> Nov 23, 2022  9:42 AM Leitha Schuller wrote: Pt requesting order for labs prior to 1-08 cpe appt  Please fu w/ pt once order is sent

## 2022-11-23 NOTE — Telephone Encounter (Signed)
Please notify patient that lab orders are in system. She can schedule fasting lab only here 1 week before 12/17/22 apt  Nobie Putnam, [facility] 11/23/2022, 11:33 AM

## 2022-12-11 ENCOUNTER — Other Ambulatory Visit: Payer: Self-pay

## 2022-12-11 DIAGNOSIS — R7309 Other abnormal glucose: Secondary | ICD-10-CM

## 2022-12-11 DIAGNOSIS — Z0000 Encounter for general adult medical examination without abnormal findings: Secondary | ICD-10-CM

## 2022-12-11 DIAGNOSIS — E7800 Pure hypercholesterolemia, unspecified: Secondary | ICD-10-CM

## 2022-12-11 DIAGNOSIS — I10 Essential (primary) hypertension: Secondary | ICD-10-CM

## 2022-12-12 ENCOUNTER — Other Ambulatory Visit: Payer: Managed Care, Other (non HMO)

## 2022-12-13 LAB — COMPLETE METABOLIC PANEL WITH GFR
AG Ratio: 1.8 (calc) (ref 1.0–2.5)
ALT: 23 U/L (ref 6–29)
AST: 14 U/L (ref 10–35)
Albumin: 4.2 g/dL (ref 3.6–5.1)
Alkaline phosphatase (APISO): 57 U/L (ref 37–153)
BUN: 17 mg/dL (ref 7–25)
CO2: 29 mmol/L (ref 20–32)
Calcium: 8.8 mg/dL (ref 8.6–10.4)
Chloride: 104 mmol/L (ref 98–110)
Creat: 0.71 mg/dL (ref 0.50–1.03)
Globulin: 2.4 g/dL (calc) (ref 1.9–3.7)
Glucose, Bld: 97 mg/dL (ref 65–99)
Potassium: 4.2 mmol/L (ref 3.5–5.3)
Sodium: 138 mmol/L (ref 135–146)
Total Bilirubin: 0.6 mg/dL (ref 0.2–1.2)
Total Protein: 6.6 g/dL (ref 6.1–8.1)
eGFR: 103 mL/min/{1.73_m2} (ref >=60)

## 2022-12-13 LAB — CBC WITH DIFFERENTIAL/PLATELET
Absolute Monocytes: 320 cells/uL (ref 200–950)
Basophils Absolute: 32 cells/uL (ref 0–200)
Basophils Relative: 0.7 %
Eosinophils Absolute: 108 cells/uL (ref 15–500)
Eosinophils Relative: 2.4 %
HCT: 41 % (ref 35.0–45.0)
Hemoglobin: 13.9 g/dL (ref 11.7–15.5)
Lymphs Abs: 1085 cells/uL (ref 850–3900)
MCH: 30 pg (ref 27.0–33.0)
MCHC: 33.9 g/dL (ref 32.0–36.0)
MCV: 88.4 fL (ref 80.0–100.0)
MPV: 10 fL (ref 7.5–12.5)
Monocytes Relative: 7.1 %
Neutro Abs: 2957 cells/uL (ref 1500–7800)
Neutrophils Relative %: 65.7 %
Platelets: 242 10*3/uL (ref 140–400)
RBC: 4.64 10*6/uL (ref 3.80–5.10)
RDW: 11.6 % (ref 11.0–15.0)
Total Lymphocyte: 24.1 %
WBC: 4.5 10*3/uL (ref 3.8–10.8)

## 2022-12-13 LAB — LIPID PANEL
Cholesterol: 191 mg/dL (ref <200)
HDL: 56 mg/dL (ref >=50)
LDL Cholesterol (Calc): 119 mg/dL (calc) — ABNORMAL HIGH
Non-HDL Cholesterol (Calc): 135 mg/dL (calc) — ABNORMAL HIGH (ref <130)
Total CHOL/HDL Ratio: 3.4 (calc) (ref <5.0)
Triglycerides: 70 mg/dL (ref <150)

## 2022-12-13 LAB — HEMOGLOBIN A1C
Hgb A1c MFr Bld: 5.1 % of total Hgb (ref <5.7)
Mean Plasma Glucose: 100 mg/dL
eAG (mmol/L): 5.5 mmol/L

## 2022-12-13 LAB — TSH: TSH: 3.24 mIU/L

## 2022-12-17 ENCOUNTER — Ambulatory Visit (INDEPENDENT_AMBULATORY_CARE_PROVIDER_SITE_OTHER): Payer: Managed Care, Other (non HMO) | Admitting: Family Medicine

## 2022-12-17 ENCOUNTER — Encounter: Payer: Self-pay | Admitting: Family Medicine

## 2022-12-17 VITALS — BP 114/66 | HR 71 | Ht 68.0 in | Wt 165.2 lb

## 2022-12-17 DIAGNOSIS — Z0000 Encounter for general adult medical examination without abnormal findings: Secondary | ICD-10-CM

## 2022-12-17 DIAGNOSIS — I10 Essential (primary) hypertension: Secondary | ICD-10-CM | POA: Diagnosis not present

## 2022-12-17 MED ORDER — LOSARTAN POTASSIUM 25 MG PO TABS: 25.0000 mg | ORAL_TABLET | Freq: Every day | ORAL | 3 refills | Status: DC

## 2022-12-17 NOTE — Patient Instructions (Addendum)
Thank you for coming to the office today.  Great job with the bladder symptoms Remain off the caffeine as best as possible, or on a low level that you can control  Muscle cramps  - Drink more water  - Try spoonful of yellow mustard to relieve leg cramps or try daily to prevent the problem  - OTC natural option is Hyland's Leg Cramps (Dissolving tablet) take as needed for muscle cramps  Future will add the Varicella Zoster Antibody titer test  Refilled the Losartan '25mg'$  daily for 1 year. Keep on the med, I do believe it is helping prevent a problem.  LDL cholesterol 119, only mild issue. Keep an eye on cholesterol intake, but no need to overhaul or major changes.  Colonoscopy / Cologuard next due in 10/2024  DUE for FASTING BLOOD WORK (no food or drink after midnight before the lab appointment, only water or coffee without cream/sugar on the morning of)  SCHEDULE "Lab Only" visit in the morning at the clinic for lab draw in 1 YEAR  - Make sure Lab Only appointment is at about 1 week before your next appointment, so that results will be available  For Lab Results, once available within 2-3 days of blood draw, you can can log in to MyChart online to view your results and a brief explanation. Also, we can discuss results at next follow-up visit.    Please schedule a Follow-up Appointment to: Return in about 1 year (around 12/18/2023) for 1 year fasting lab only then 1 week later Annual Physical.  If you have any other questions or concerns, please feel free to call the office or send a message through [place]. You may also schedule an earlier appointment if necessary.  Additionally, you may be receiving a survey about your experience at our office within a few days to 1 week by e-mail or mail. We value your feedback.  Nobie Putnam, DO [facility]

## 2022-12-17 NOTE — Progress Notes (Unsigned)
Subjective:    Patient ID: Sheryl Carr, female    DOB: 08/03/1971, 52 y.o.   MRN: [id number]  Sheryl Carr is a 52 y.o. female presenting on 12/17/2022 for Annual Exam   HPI  Here for Annual Physical and lab review.   Wellness / Lifestyle BMI >25 / Elevated LDL Lifestyle improvement overall, weight stable Last lab with LDL up from 101 to 119, still within stable range See below diet - gluten free Not on cholesterol rx medication   CHRONIC HTN: Reports no new concerns. She says originally dx with HTN 2009 approx after 2nd child then later treated in 2014, previously on Lisinopril Current Meds - Losartan '25mg'$  daily, reduced dose last year Reports good compliance, took meds today. Tolerating well, w/o complaints. Denies CP, dyspnea, HA, edema, dizziness / lightheadedness  Osteoarthritis multiple joints Joint pain episodic, hands wrist mostly History of ganglion cyst has resolved Rarely using topical Diclofenac  Urinary Frequency / Urge / Overactive Bladder Last visit 07/11/22 She stopped caffeine and chocolate and she had dramatic improvement in her results. Caffeine causing bladder irritation seems to be the answer Did not take the anti cholinergic med Seems to be resolved currently doing well. Not on medication Denies dysuria, fever chills, nausea vomiting, back pain flank pain, hematuria    Health Maintenance:   Colonoscopy completed 11/02/19, Dr Bonna Gains AGI - repeat in 5 years. 2025   Last Mammogram 11/19/22 negative.   UTD Flu   COVID Vaccine status - Pfizer 02/18/20 and 03/10/20. Considering booster.     12/18/2022    6:09 PM 12/18/2021    1:40 PM 11/24/2021    8:27 AM  Depression screen PHQ 2/9  Decreased Interest 0 0 0  Down, Depressed, Hopeless 0 0 0  PHQ - 2 Score 0 0 0  Altered sleeping  1   Tired, decreased energy  0   Change in appetite  0   Feeling bad or failure about yourself   0   Trouble concentrating  0   Moving slowly or  fidgety/restless  0   Suicidal thoughts  0   PHQ-9 Score  1   Difficult doing work/chores  Not difficult at all     Past Medical History:  Diagnosis Date   Anemia    Chronic kidney disease    H/O KIDNEY STONES   Endometrial polyp 2016   hysteroscopically removed   GERD (gastroesophageal reflux disease)    H/O mammogram 05/11/2015   Hematuria    History of kidney stones    History of uterine fibroid    s/p hysterectomy 08/2017   HTN (hypertension)    PT WAS TAKEN OFF HER BP MED IN MAY/JUNE OF 2016 BUT BP IS ELEVATED AGAIN   Past Surgical History:  Procedure Laterality Date   ABDOMINAL HYSTERECTOMY     BREAST CYST ASPIRATION Right 12/24/2019   CESAREAN SECTION     COLONOSCOPY WITH PROPOFOL N/A 11/02/2019   Procedure: COLONOSCOPY WITH PROPOFOL;  Surgeon: Virgel Manifold, MD;  Location: [facility];  Service: Endoscopy;  Laterality: N/A;   HYSTERECTOMY ABDOMINAL WITH SALPINGECTOMY Bilateral 08/19/2017   Procedure: HYSTERECTOMY ABDOMINAL WITH BILATERAL SALPINGECTOMY;  Surgeon: [name], MD;  Location: [facility] OR[facility];  Service: Gynecology;  Laterality: Bilateral;   HYSTEROSCOPY WITH D & C N/A 11/14/2015   Procedure: DILATATION AND CURETTAGE /HYSTEROSCOPY;  Surgeon: [name], MD;  Location: [facility] ORS;  Service: Gynecology;  Laterality: N/A;   KIDNEY STONE SURGERY     Social  History   Socioeconomic History   Marital status: Married    Spouse name: Sheryl Carr   Number of children: 2   Years of education: college   Highest education level: Not on file  Occupational History   Occupation: n/a    Employer: [employer]  Tobacco Use   Smoking status: Former    Types: Cigarettes    Quit date: 09/24/1990    Years since quitting: 32.2   Smokeless tobacco: Former  Scientific laboratory technician Use: Never used  Substance and Sexual Activity   Alcohol use: Yes    Alcohol/week: 2.0 - 3.0 standard drinks of alcohol    Types: 2 - 3 Standard drinks or equivalent per week    Comment: social    Drug use: No   Sexual activity: Yes    Birth control/protection: None  Other Topics Concern   Not on file  Social History Narrative   Patient is married Sheryl Carr) and lives at home with her husband and one child, one child is in college.   Patient is working full-time.   Patient has a college education.   Patient is right-handed.   Patient drinks 1-2 cups of coffee daily, soda and tea are occasionally, not daily.   Social Determinants of Health   Financial Resource Strain: Not on file  Food Insecurity: Not on file  Transportation Needs: Not on file  Physical Activity: Not on file  Stress: Not on file  Social Connections: Not on file  Intimate Partner Violence: Not on file   Family History  Problem Relation Age of Onset   Diabetes Mellitus I Mother    Diabetes Mellitus I Maternal Grandmother    Prostate cancer Maternal Grandfather    Diabetes Mellitus I Paternal Grandmother    Breast cancer Neg Hx    Current Outpatient Medications on File Prior to Visit  Medication Sig   Multiple Vitamins-Calcium (ONE-A-DAY WOMENS FORMULA PO) Take 1 tablet by mouth daily.    diclofenac Sodium (VOLTAREN) 1 % GEL Apply 2 g topically 4 (four) times daily as needed. (Patient not taking: Reported on 12/17/2022)   econazole nitrate 1 % cream Apply once a day as needed for rash. (Patient not taking: Reported on 12/17/2022)   No current facility-administered medications on file prior to visit.    Review of Systems  Constitutional:  Negative for activity change, appetite change, chills, diaphoresis, fatigue and fever.  HENT:  Negative for congestion and hearing loss.   Eyes:  Negative for visual disturbance.  Respiratory:  Negative for cough, chest tightness, shortness of breath and wheezing.   Cardiovascular:  Negative for chest pain, palpitations and leg swelling.  Gastrointestinal:  Negative for abdominal pain, constipation, diarrhea, nausea and vomiting.  Genitourinary:  Negative for dysuria,  frequency and hematuria.  Musculoskeletal:  Negative for arthralgias and neck pain.  Skin:  Negative for rash.  Neurological:  Negative for dizziness, weakness, light-headedness, numbness and headaches.  Hematological:  Negative for adenopathy.  Psychiatric/Behavioral:  Negative for behavioral problems, dysphoric mood and sleep disturbance.    Per HPI unless specifically indicated above      Objective:    BP 114/66   Pulse 71   Ht '5\' 8"'$  (1.727 m)   Wt 165 lb 3.2 oz (74.9 kg)   LMP 08/09/2017 (Exact Date)   SpO2 100%   BMI 25.12 kg/m   Wt Readings from Last 3 Encounters:  12/17/22 165 lb 3.2 oz (74.9 kg)  07/11/22 163 lb 3.2 oz (74  kg)  06/22/22 164 lb (74.4 kg)    Physical Exam Vitals and nursing note reviewed.  Constitutional:      General: She is not in acute distress.    Appearance: She is well-developed. She is not diaphoretic.     Comments: Well-appearing, comfortable, cooperative  HENT:     Head: Normocephalic and atraumatic.     Right Ear: Tympanic membrane, ear canal and external ear normal. There is no impacted cerumen.     Left Ear: Tympanic membrane, ear canal and external ear normal. There is no impacted cerumen.  Eyes:     General:        Right eye: No discharge.        Left eye: No discharge.     Conjunctiva/sclera: Conjunctivae normal.     Pupils: Pupils are equal, round, and reactive to light.  Neck:     Thyroid: No thyromegaly.     Vascular: No carotid bruit.  Cardiovascular:     Rate and Rhythm: Normal rate and regular rhythm.     Pulses: Normal pulses.     Heart sounds: Normal heart sounds. No murmur heard. Pulmonary:     Effort: Pulmonary effort is normal. No respiratory distress.     Breath sounds: Normal breath sounds. No wheezing or rales.  Abdominal:     General: Bowel sounds are normal. There is no distension.     Palpations: Abdomen is soft. There is no mass.     Tenderness: There is no abdominal tenderness.  Musculoskeletal:         General: No tenderness. Normal range of motion.     Cervical back: Normal range of motion and neck supple.     Right lower leg: No edema.     Left lower leg: No edema.     Comments: Upper / Lower Extremities: - Normal muscle tone, strength bilateral upper extremities 5/5, lower extremities 5/5  Lymphadenopathy:     Cervical: No cervical adenopathy.  Skin:    General: Skin is warm and dry.     Findings: No erythema or rash.  Neurological:     Mental Status: She is alert and oriented to person, place, and time.     Comments: Distal sensation intact to light touch all extremities  Psychiatric:        Mood and Affect: Mood normal.        Behavior: Behavior normal.        Thought Content: Thought content normal.     Comments: Well groomed, good eye contact, normal speech and thoughts       Results for orders placed or performed in visit on 12/11/22  TSH  Result Value Ref Range   TSH 3.24 mIU/L  Hemoglobin A1c  Result Value Ref Range   Hgb A1c MFr Bld 5.1 <5.7 % of total Hgb   Mean Plasma Glucose 100 mg/dL   eAG (mmol/L) 5.5 mmol/L  Lipid panel  Result Value Ref Range   Cholesterol 191 <200 mg/dL   HDL 56 > OR = 50 mg/dL   Triglycerides 70 <150 mg/dL   LDL Cholesterol (Calc) 119 (H) mg/dL (calc)   Total CHOL/HDL Ratio 3.4 <5.0 (calc)   Non-HDL Cholesterol (Calc) 135 (H) <130 mg/dL (calc)  CBC with Differential/Platelet  Result Value Ref Range   WBC 4.5 3.8 - 10.8 Thousand/uL   RBC 4.64 3.80 - 5.10 Million/uL   Hemoglobin 13.9 11.7 - 15.5 g/dL   HCT 41.0 35.0 - 45.0 %  MCV 88.4 80.0 - 100.0 fL   MCH 30.0 27.0 - 33.0 pg   MCHC 33.9 32.0 - 36.0 g/dL   RDW 11.6 11.0 - 15.0 %   Platelets 242 140 - 400 Thousand/uL   MPV 10.0 7.5 - 12.5 fL   Neutro Abs 2,957 1,500 - 7,800 cells/uL   Lymphs Abs 1,085 850 - 3,900 cells/uL   Absolute Monocytes 320 200 - 950 cells/uL   Eosinophils Absolute 108 15 - 500 cells/uL   Basophils Absolute 32 0 - 200 cells/uL   Neutrophils Relative  % 65.7 %   Total Lymphocyte 24.1 %   Monocytes Relative 7.1 %   Eosinophils Relative 2.4 %   Basophils Relative 0.7 %  COMPLETE METABOLIC PANEL WITH GFR  Result Value Ref Range   Glucose, Bld 97 65 - 99 mg/dL   BUN 17 7 - 25 mg/dL   Creat 0.71 0.50 - 1.03 mg/dL   eGFR 103 > OR = 60 mL/min/1.28m   BUN/Creatinine Ratio SEE NOTE: 6 - 22 (calc)   Sodium 138 135 - 146 mmol/L   Potassium 4.2 3.5 - 5.3 mmol/L   Chloride 104 98 - 110 mmol/L   CO2 29 20 - 32 mmol/L   Calcium 8.8 8.6 - 10.4 mg/dL   Total Protein 6.6 6.1 - 8.1 g/dL   Albumin 4.2 3.6 - 5.1 g/dL   Globulin 2.4 1.9 - 3.7 g/dL (calc)   AG Ratio 1.8 1.0 - 2.5 (calc)   Total Bilirubin 0.6 0.2 - 1.2 mg/dL   Alkaline phosphatase (APISO) 57 37 - 153 U/L   AST 14 10 - 35 U/L   ALT 23 6 - 29 U/L      Assessment & Plan:   Problem List Items Addressed This Visit     Essential hypertension    Well-controlled HTN - Home BP readings normal No known complications  Plan:  1. Continue Losartan '25mg'$  daily lowest dose for now - discussed goal is prevent future problem from elevated BP at times, can reconsider discontinue med in future but advise to keep very low dose to prevent problem. 2. Encourage improved lifestyle - low sodium diet, improve regular exercise 3. Continue monitor BP outside office, bring readings to next visit, if persistently >140/90 or new symptoms notify office sooner      Relevant Medications   losartan (COZAAR) 25 MG tablet   Other Visit Diagnoses     Annual physical exam    -  Primary       Updated Health Maintenance information Reviewed recent lab results with patient Encouraged improvement to lifestyle with diet and exercise Goal of weight loss  LDL cholesterol 119, only mild issue. Keep an eye on cholesterol intake, but no need to overhaul or major changes.  OAB - resolved Seems likely caffeine triggered symptoms - now resolved with modification to dietary caffeine intake  Muscle cramps Drink  more water Mustard Hylands Leg Cramp  Eval for future shingrix vaccine Future will add the Varicella Zoster Antibody titer test, due to uncertain immunity status with no history documented of chicken pox in childhood    Meds ordered this encounter  Medications   losartan (COZAAR) 25 MG tablet    Sig: Take 1 tablet (25 mg total) by mouth daily.    Dispense:  90 tablet    Refill:  3     Follow up plan: Return in about 1 year (around 12/18/2023) for 1 year fasting lab only then 1 week later Annual  Physical.  Future order Shingles zoster ab varicella with other labs  Nobie Putnam, [facility] 12/17/2022, 4:15 PM

## 2022-12-18 ENCOUNTER — Other Ambulatory Visit: Payer: Self-pay | Admitting: Family Medicine

## 2022-12-18 DIAGNOSIS — E7800 Pure hypercholesterolemia, unspecified: Secondary | ICD-10-CM

## 2022-12-18 DIAGNOSIS — I10 Essential (primary) hypertension: Secondary | ICD-10-CM

## 2022-12-18 DIAGNOSIS — R7309 Other abnormal glucose: Secondary | ICD-10-CM

## 2022-12-18 DIAGNOSIS — Z0000 Encounter for general adult medical examination without abnormal findings: Secondary | ICD-10-CM

## 2022-12-18 DIAGNOSIS — Z789 Other specified health status: Secondary | ICD-10-CM

## 2022-12-18 NOTE — Assessment & Plan Note (Signed)
Well-controlled HTN - Home BP readings normal No known complications  Plan:  1. Continue Losartan '25mg'$  daily lowest dose for now - discussed goal is prevent future problem from elevated BP at times, can reconsider discontinue med in future but advise to keep very low dose to prevent problem. 2. Encourage improved lifestyle - low sodium diet, improve regular exercise 3. Continue monitor BP outside office, bring readings to next visit, if persistently >140/90 or new symptoms notify office sooner

## 2023-01-30 ENCOUNTER — Encounter: Payer: Self-pay | Admitting: Family Medicine

## 2023-04-09 ENCOUNTER — Ambulatory Visit: Payer: Managed Care, Other (non HMO) | Admitting: Family Medicine

## 2023-06-18 NOTE — Progress Notes (Signed)
ANNUAL PREVENTATIVE CARE GYNECOLOGY  ENCOUNTER NOTE  Subjective:       Sheryl Carr is a 52 y.o. G48P2002 female here for a routine annual gynecologic exam. The patient {is/is not/has never been:13135} sexually active. The patient {is/is not:13135} taking hormone replacement therapy. {post-men bleed:13152::"Patient denies post-menopausal vaginal bleeding."} The patient wears seatbelts: {yes/no:311178}. The patient participates in regular exercise: {yes/no/not asked:9010}. Has the patient ever been transfused or tattooed?: {yes/no/not asked:9010}. The patient reports that there {is/is not:9024} domestic violence in her life.  Current complaints: 1.  ***    Gynecologic History Patient's last menstrual period was 08/09/2017 (exact date). Contraception: status post hysterectomy Last Pap:  07/2015. Results were: normal.  Denies h/o abnormal pap smears.  Last mammogram: 11/19/2022. Results were: normal Last Colonoscopy: 11/02/2019 Last Dexa Scan:    Obstetric History OB History  Gravida Para Term Preterm AB Living  2 2 2  0 0 2  SAB IAB Ectopic Multiple Live Births  0 0 0   2    # Outcome Date GA Lbr Len/2nd Weight Sex Delivery Anes PTL Lv  2 Term         LIV  1 Term         LIV    Past Medical History:  Diagnosis Date   Anemia    Chronic kidney disease    H/O KIDNEY STONES   Endometrial polyp 2016   hysteroscopically removed   GERD (gastroesophageal reflux disease)    H/O mammogram 05/11/2015   Hematuria    History of kidney stones    History of uterine fibroid    s/p hysterectomy 08/2017   HTN (hypertension)    PT WAS TAKEN OFF HER BP MED IN MAY/JUNE OF 2016 BUT BP IS ELEVATED AGAIN    Family History  Problem Relation Age of Onset   Diabetes Mellitus I Mother    Diabetes Mellitus I Maternal Grandmother    Prostate cancer Maternal Grandfather    Diabetes Mellitus I Paternal Grandmother    Breast cancer Neg Hx     Past Surgical History:  Procedure Laterality  Date   ABDOMINAL HYSTERECTOMY     BREAST CYST ASPIRATION Right 12/24/2019   CESAREAN SECTION     COLONOSCOPY WITH PROPOFOL N/A 11/02/2019   Procedure: COLONOSCOPY WITH PROPOFOL;  Surgeon: Pasty Spillers, MD;  Location: [facility];  Service: Endoscopy;  Laterality: N/A;   HYSTERECTOMY ABDOMINAL WITH SALPINGECTOMY Bilateral 08/19/2017   Procedure: HYSTERECTOMY ABDOMINAL WITH BILATERAL SALPINGECTOMY;  Surgeon: Hildred Laser, MD;  Location: [facility];  Service: Gynecology;  Laterality: Bilateral;   HYSTEROSCOPY WITH D & C N/A 11/14/2015   Procedure: DILATATION AND CURETTAGE /HYSTEROSCOPY;  Surgeon: Hildred Laser, MD;  Location: [facility];  Service: Gynecology;  Laterality: N/A;   KIDNEY STONE SURGERY      Social History   Socioeconomic History   Marital status: Married    Spouse name: Tammy Sours   Number of children: 2   Years of education: college   Highest education level: Not on file  Occupational History   Occupation: n/a    Employer: [employer]  Tobacco Use   Smoking status: Former    Types: Cigarettes    Quit date: 09/24/1990    Years since quitting: 32.7   Smokeless tobacco: Former  Building services engineer Use: Never used  Substance and Sexual Activity   Alcohol use: Yes    Alcohol/week: 2.0 - 3.0 standard drinks of alcohol    Types: 2 - 3  Standard drinks or equivalent per week    Comment: social   Drug use: No   Sexual activity: Yes    Birth control/protection: None  Other Topics Concern   Not on file  Social History Narrative   ** Merged History Encounter **       Patient is married Therapist, art) and lives at home with her husband and one child, one child is in college.   Patient is working full-time.   Patient has a college education.   Patient is right-handed.   Patient drinks 1-2 cups of coffee daily, soda and tea are occasionally, not daily.   Social Determinants of Health   Financial Resource Strain: Not on file  Food Insecurity: Not on file  Transportation  Needs: Not on file  Physical Activity: Not on file  Stress: Not on file  Social Connections: Not on file  Intimate Partner Violence: Not on file    Current Outpatient Medications on File Prior to Visit  Medication Sig Dispense Refill   diclofenac Sodium (VOLTAREN) 1 % GEL Apply 2 g topically 4 (four) times daily as needed. (Patient not taking: Reported on 12/17/2022) 100 g 2   econazole nitrate 1 % cream Apply once a day as needed for rash. (Patient not taking: Reported on 12/17/2022)  2   losartan (COZAAR) 25 MG tablet Take 1 tablet (25 mg total) by mouth daily. 90 tablet 3   Multiple Vitamins-Calcium (ONE-A-DAY WOMENS FORMULA PO) Take 1 tablet by mouth daily.      No current facility-administered medications on file prior to visit.    Allergies  Allergen Reactions   Pollen Extract     itchy eyes, dull headaches, runny nose   Penicillins Rash    Has patient had a PCN reaction causing immediate rash, facial/tongue/throat swelling, SOB or lightheadedness with hypotension: No Has patient had a PCN reaction causing severe rash involving mucus membranes or skin necrosis: No Has patient had a PCN reaction that required hospitalization: No Has patient had a PCN reaction occurring within the last 10 years: No If all of the above answers are "NO", then may proceed with Cephalosporin use.       Review of Systems ROS Review of Systems - General ROS: negative for - chills, fatigue, fever, hot flashes, night sweats, weight gain or weight loss Psychological ROS: negative for - anxiety, decreased libido, depression, mood swings, physical abuse or sexual abuse Ophthalmic ROS: negative for - blurry vision, eye pain or loss of vision ENT ROS: negative for - headaches, hearing change, visual changes or vocal changes Allergy and Immunology ROS: negative for - hives, itchy/watery eyes or seasonal allergies Hematological and Lymphatic ROS: negative for - bleeding problems, bruising, swollen lymph nodes  or weight loss Endocrine ROS: negative for - galactorrhea, hair pattern changes, hot flashes, malaise/lethargy, mood swings, palpitations, polydipsia/polyuria, skin changes, temperature intolerance or unexpected weight changes Breast ROS: negative for - new or changing breast lumps or nipple discharge Respiratory ROS: negative for - cough or shortness of breath Cardiovascular ROS: negative for - chest pain, irregular heartbeat, palpitations or shortness of breath Gastrointestinal ROS: no abdominal pain, change in bowel habits, or black or bloody stools Genito-Urinary ROS: no dysuria, trouble voiding, or hematuria Musculoskeletal ROS: negative for - joint pain or joint stiffness Neurological ROS: negative for - bowel and bladder control changes Dermatological ROS: negative for rash and skin lesion changes   Objective:   LMP 08/09/2017 (Exact Date)  CONSTITUTIONAL: Well-developed, well-nourished female in no acute  distress.  PSYCHIATRIC: Normal mood and affect. Normal behavior. Normal judgment and thought content. NEUROLGIC: Alert and oriented to person, place, and time. Normal muscle tone coordination. No cranial nerve deficit noted. HENT:  Normocephalic, atraumatic, External right and left ear normal. Oropharynx is clear and moist EYES: Conjunctivae and EOM are normal. Pupils are equal, round, and reactive to light. No scleral icterus.  NECK: Normal range of motion, supple, no masses.  Normal thyroid.  SKIN: Skin is warm and dry. No rash noted. Not diaphoretic. No erythema. No pallor. CARDIOVASCULAR: Normal heart rate noted, regular rhythm, no murmur. RESPIRATORY: Clear to auscultation bilaterally. Effort and breath sounds normal, no problems with respiration noted. BREASTS: Symmetric in size. No masses, skin changes, nipple drainage, or lymphadenopathy. ABDOMEN: Soft, normal bowel sounds, no distention noted.  No tenderness, rebound or guarding.  BLADDER: Normal PELVIC:  Bladder  {:311640}  Urethra: {:311719}  Vulva: {:311722}  Vagina: {:311643}  Cervix: {:311644}  Uterus: {:311718}  Adnexa: {:311645}  RV: {Blank multiple:19196::"External Exam NormaI","No Rectal Masses","Normal Sphincter tone"}  MUSCULOSKELETAL: Normal range of motion. No tenderness.  No cyanosis, clubbing, or edema.  2+ distal pulses. LYMPHATIC: No Axillary, Supraclavicular, or Inguinal Adenopathy.   Labs: Lab Results  Component Value Date   WBC 4.5 12/12/2022   HGB 13.9 12/12/2022   HCT 41.0 12/12/2022   MCV 88.4 12/12/2022   PLT 242 12/12/2022    Lab Results  Component Value Date   CREATININE 0.71 12/12/2022   BUN 17 12/12/2022   NA 138 12/12/2022   K 4.2 12/12/2022   CL 104 12/12/2022   CO2 29 12/12/2022    Lab Results  Component Value Date   ALT 23 12/12/2022   AST 14 12/12/2022   ALKPHOS 50 06/25/2017   BILITOT 0.6 12/12/2022    Lab Results  Component Value Date   CHOL 191 12/12/2022   HDL 56 12/12/2022   LDLCALC 119 (H) 12/12/2022   TRIG 70 12/12/2022   CHOLHDL 3.4 12/12/2022    Lab Results  Component Value Date   TSH 3.24 12/12/2022    Lab Results  Component Value Date   HGBA1C 5.1 12/12/2022     Assessment:   No diagnosis found.   Plan:  Pap: {Blank multiple:19196::"Pap, Reflex if ASCUS","Pap Co Test","GC/CT NAAT","Not needed","Not done"} Mammogram: {Blank multiple:19196::"***","Ordered","Not Ordered","Not Indicated"} Colon Screening:  {Blank multiple:19196::"***","Ordered","Not Ordered","Not Indicated"} Labs: {Blank multiple:19196::"Lipid 1","FBS","TSH","Hemoglobin A1C","Vit D Level""***"} Routine preventative health maintenance measures emphasized: {Blank multiple:19196::"Exercise/Diet/Weight control","Tobacco Warnings","Alcohol/Substance use risks","Stress Management","Peer Pressure Issues","Safe Sex"} COVID Vaccination status: Return to Clinic - 1 Year   [employer], [place]  [facility]

## 2023-06-19 ENCOUNTER — Encounter: Payer: Self-pay | Admitting: Obstetrics and Gynecology

## 2023-06-19 ENCOUNTER — Ambulatory Visit: Payer: Managed Care, Other (non HMO) | Admitting: Obstetrics and Gynecology

## 2023-06-19 VITALS — BP 123/91 | HR 68 | Resp 16 | Ht 69.0 in | Wt 168.6 lb

## 2023-06-19 DIAGNOSIS — N951 Menopausal and female climacteric states: Secondary | ICD-10-CM

## 2023-06-19 DIAGNOSIS — Z01419 Encounter for gynecological examination (general) (routine) without abnormal findings: Secondary | ICD-10-CM

## 2023-06-19 DIAGNOSIS — Z1231 Encounter for screening mammogram for malignant neoplasm of breast: Secondary | ICD-10-CM

## 2023-06-19 DIAGNOSIS — I10 Essential (primary) hypertension: Secondary | ICD-10-CM

## 2023-06-19 NOTE — Patient Instructions (Addendum)
Preventive Care 40-52 Years Old, Female Preventive care refers to lifestyle choices and visits with your health care provider that can promote health and wellness. Preventive care visits are also called wellness exams. What can I expect for my preventive care visit? Counseling Your health care provider may ask you questions about your: Medical history, including: Past medical problems. Family medical history. Pregnancy history. Current health, including: Menstrual cycle. Method of birth control. Emotional well-being. Home life and relationship well-being. Sexual activity and sexual health. Lifestyle, including: Alcohol, nicotine or tobacco, and drug use. Access to firearms. Diet, exercise, and sleep habits. Work and work environment. Sunscreen use. Safety issues such as seatbelt and bike helmet use. Physical exam Your health care provider will check your: Height and weight. These may be used to calculate your BMI (body mass index). BMI is a measurement that tells if you are at a healthy weight. Waist circumference. This measures the distance around your waistline. This measurement also tells if you are at a healthy weight and may help predict your risk of certain diseases, such as type 2 diabetes and high blood pressure. Heart rate and blood pressure. Body temperature. Skin for abnormal spots. What immunizations do I need?  Vaccines are usually given at various ages, according to a schedule. Your health care provider will recommend vaccines for you based on your age, medical history, and lifestyle or other factors, such as travel or where you work. What tests do I need? Screening Your health care provider may recommend screening tests for certain conditions. This may include: Lipid and cholesterol levels. Diabetes screening. This is done by checking your blood sugar (glucose) after you have not eaten for a while (fasting). Pelvic exam and Pap test. Hepatitis B test. Hepatitis C  test. HIV (human immunodeficiency virus) test. STI (sexually transmitted infection) testing, if you are at risk. Lung cancer screening. Colorectal cancer screening. Mammogram. Talk with your health care provider about when you should start having regular mammograms. This may depend on whether you have a family history of breast cancer. BRCA-related cancer screening. This may be done if you have a family history of breast, ovarian, tubal, or peritoneal cancers. Bone density scan. This is done to screen for osteoporosis. Talk with your health care provider about your test results, treatment options, and if necessary, the need for more tests. Follow these instructions at home: Eating and drinking  Eat a diet that includes fresh fruits and vegetables, whole grains, lean protein, and low-fat dairy products. Take vitamin and mineral supplements as recommended by your health care provider. Do not drink alcohol if: Your health care provider tells you not to drink. You are pregnant, may be pregnant, or are planning to become pregnant. If you drink alcohol: Limit how much you have to 0-1 drink a day. Know how much alcohol is in your drink. In the [place] one drink equals one 12 oz bottle of beer (355 mL), one 5 oz glass of wine (148 mL), or one 1 oz glass of hard liquor (44 mL). Lifestyle Brush your teeth every morning and night with fluoride toothpaste. Floss one time each day. Exercise for at least 30 minutes 5 or more days each week. Do not use any products that contain nicotine or tobacco. These products include cigarettes, chewing tobacco, and vaping devices, such as e-cigarettes. If you need help quitting, ask your health care provider. Do not use drugs. If you are sexually active, practice safe sex. Use a condom or other form of protection to   prevent STIs. If you do not wish to become pregnant, use a form of birth control. If you plan to become pregnant, see your health care provider for a  prepregnancy visit. Take aspirin only as told by your health care provider. Make sure that you understand how much to take and what form to take. Work with your health care provider to find out whether it is safe and beneficial for you to take aspirin daily. Find healthy ways to manage stress, such as: Meditation, yoga, or listening to music. Journaling. Talking to a trusted person. Spending time with friends and family. Minimize exposure to UV radiation to reduce your risk of skin cancer. Safety Always wear your seat belt while driving or riding in a vehicle. Do not drive: If you have been drinking alcohol. Do not ride with someone who has been drinking. When you are tired or distracted. While texting. If you have been using any mind-altering substances or drugs. Wear a helmet and other protective equipment during sports activities. If you have firearms in your house, make sure you follow all gun safety procedures. Seek help if you have been physically or sexually abused. What's next? Visit your health care provider once a year for an annual wellness visit. Ask your health care provider how often you should have your eyes and teeth checked. Stay up to date on all vaccines. This information is not intended to replace advice given to you by your health care provider. Make sure you discuss any questions you have with your health care provider. Document Revised: 05/24/2021 Document Reviewed: 05/24/2021 Elsevier Patient Education  2024 Elsevier Inc. Breast Self-Awareness Breast self-awareness is knowing how your breasts look and feel. You need to: Check your breasts on a regular basis. Tell your doctor about any changes. Become familiar with the look and feel of your breasts. This can help you catch a breast problem while it is still small and can be treated. You should do breast self-exams even if you have breast implants. What you need: A mirror. A well-lit room. A pillow or other  soft object. How to do a breast self-exam Follow these steps to do a breast self-exam: Look for changes  Take off all the clothes above your waist. Stand in front of a mirror in a room with good lighting. Put your hands down at your sides. Compare your breasts in the mirror. Look for any difference between them, such as: A difference in shape. A difference in size. Wrinkles, dips, and bumps in one breast and not the other. Look at each breast for changes in the skin, such as: Redness. Scaly areas. Skin that has gotten thicker. Dimpling. Open sores (ulcers). Look for changes in your nipples, such as: Fluid coming out of a nipple. Fluid around a nipple. Bleeding. Dimpling. Redness. A nipple that looks pushed in (retracted), or that has changed position. Feel for changes Lie on your back. Feel each breast. To do this: Pick a breast to feel. Place a pillow under the shoulder closest to that breast. Put the arm closest to that breast behind your head. Feel the nipple area of that breast using the hand of your other arm. Feel the area with the pads of your three middle fingers by making small circles with your fingers. Use light, medium, and firm pressure. Continue the overlapping circles, moving downward over the breast. Keep making circles with your fingers. Stop when you feel your ribs. Start making circles with your fingers again, this time going   upward until you reach your collarbone. Then, make circles outward across your breast and into your armpit area. Squeeze your nipple. Check for discharge and lumps. Repeat these steps to check your other breast. Sit or stand in the tub or shower. With soapy water on your skin, feel each breast the same way you did when you were lying down. Write down what you find Writing down what you find can help you remember what to tell your doctor. Write down: What is normal for each breast. Any changes you find in each breast. These  include: The kind of changes you find. A tender or painful breast. Any lump you find. Write down its size and where it is. When you last had your monthly period (menstrual cycle). General tips If you are breastfeeding, the best time to check your breasts is after you feed your baby or after you use a breast pump. If you get monthly bleeding, the best time to check your breasts is 5-7 days after your monthly cycle ends. With time, you will become comfortable with the self-exam. You will also start to know if there are changes in your breasts. Contact a doctor if: You see a change in the shape or size of your breasts or nipples. You see a change in the skin of your breast or nipples, such as red or scaly skin. You have fluid coming from your nipples that is not normal. You find a new lump or thick area. You have breast pain. You have any concerns about your breast health. Summary Breast self-awareness includes looking for changes in your breasts and feeling for changes within your breasts. You should do breast self-awareness in front of a mirror in a well-lit room. If you get monthly periods (menstrual cycles), the best time to check your breasts is 5-7 days after your period ends. Tell your doctor about any changes you see in your breasts. Changes include changes in size, changes on the skin, painful or tender breasts, or fluid from your nipples that is not normal. This information is not intended to replace advice given to you by your health care provider. Make sure you discuss any questions you have with your health care provider. Document Revised: 05/03/2022 Document Reviewed: 09/28/2021 Elsevier Patient Education  2024 Elsevier Inc.  

## 2023-10-02 ENCOUNTER — Encounter: Payer: Self-pay | Admitting: Family Medicine

## 2023-10-02 ENCOUNTER — Other Ambulatory Visit: Payer: Self-pay | Admitting: Family Medicine

## 2023-10-02 DIAGNOSIS — Z1231 Encounter for screening mammogram for malignant neoplasm of breast: Secondary | ICD-10-CM

## 2023-10-08 ENCOUNTER — Ambulatory Visit (INDEPENDENT_AMBULATORY_CARE_PROVIDER_SITE_OTHER): Payer: Managed Care, Other (non HMO)

## 2023-10-08 ENCOUNTER — Ambulatory Visit: Payer: Managed Care, Other (non HMO)

## 2023-10-08 DIAGNOSIS — Z23 Encounter for immunization: Secondary | ICD-10-CM

## 2023-11-21 ENCOUNTER — Ambulatory Visit
Admission: RE | Admit: 2023-11-21 | Discharge: 2023-11-21 | Disposition: A | Discharge status: Home / Self Care | Payer: Managed Care, Other (non HMO) | Source: Ambulatory Visit | Attending: Family Medicine | Admitting: Family Medicine

## 2023-11-21 DIAGNOSIS — Z1231 Encounter for screening mammogram for malignant neoplasm of breast: Secondary | ICD-10-CM | POA: Insufficient documentation

## 2023-12-18 ENCOUNTER — Other Ambulatory Visit: Payer: Managed Care, Other (non HMO)

## 2023-12-18 DIAGNOSIS — E7800 Pure hypercholesterolemia, unspecified: Secondary | ICD-10-CM

## 2023-12-18 DIAGNOSIS — R7309 Other abnormal glucose: Secondary | ICD-10-CM

## 2023-12-18 DIAGNOSIS — I10 Essential (primary) hypertension: Secondary | ICD-10-CM

## 2023-12-18 DIAGNOSIS — Z789 Other specified health status: Secondary | ICD-10-CM

## 2023-12-18 DIAGNOSIS — Z0000 Encounter for general adult medical examination without abnormal findings: Secondary | ICD-10-CM

## 2023-12-19 LAB — COMPLETE METABOLIC PANEL WITH GFR
AG Ratio: 1.9 (calc) (ref 1.0–2.5)
ALT: 42 U/L — ABNORMAL HIGH (ref 6–29)
AST: 21 U/L (ref 10–35)
Albumin: 4.4 g/dL (ref 3.6–5.1)
Alkaline phosphatase (APISO): 63 U/L (ref 37–153)
BUN: 22 mg/dL (ref 7–25)
CO2: 26 mmol/L (ref 20–32)
Calcium: 9.4 mg/dL (ref 8.6–10.4)
Chloride: 103 mmol/L (ref 98–110)
Creat: 0.79 mg/dL (ref 0.50–1.03)
Globulin: 2.3 g/dL (ref 1.9–3.7)
Glucose, Bld: 99 mg/dL (ref 65–99)
Potassium: 4.5 mmol/L (ref 3.5–5.3)
Sodium: 139 mmol/L (ref 135–146)
Total Bilirubin: 0.6 mg/dL (ref 0.2–1.2)
Total Protein: 6.7 g/dL (ref 6.1–8.1)
eGFR: 90 mL/min/{1.73_m2} (ref >=60)

## 2023-12-19 LAB — HEMOGLOBIN A1C
Hgb A1c MFr Bld: 5.1 %{Hb} (ref <5.7)
Mean Plasma Glucose: 100 mg/dL
eAG (mmol/L): 5.5 mmol/L

## 2023-12-19 LAB — CBC WITH DIFFERENTIAL/PLATELET
Absolute Lymphocytes: 1331 {cells}/uL (ref 850–3900)
Absolute Monocytes: 440 {cells}/uL (ref 200–950)
Basophils Absolute: 50 {cells}/uL (ref 0–200)
Basophils Relative: 0.9 %
Eosinophils Absolute: 138 {cells}/uL (ref 15–500)
Eosinophils Relative: 2.5 %
HCT: 41.2 % (ref 35.0–45.0)
Hemoglobin: 13.4 g/dL (ref 11.7–15.5)
MCH: 28.9 pg (ref 27.0–33.0)
MCHC: 32.5 g/dL (ref 32.0–36.0)
MCV: 89 fL (ref 80.0–100.0)
MPV: 10.3 fL (ref 7.5–12.5)
Monocytes Relative: 8 %
Neutro Abs: 3542 {cells}/uL (ref 1500–7800)
Neutrophils Relative %: 64.4 %
Platelets: 257 10*3/uL (ref 140–400)
RBC: 4.63 10*6/uL (ref 3.80–5.10)
RDW: 11.9 % (ref 11.0–15.0)
Total Lymphocyte: 24.2 %
WBC: 5.5 10*3/uL (ref 3.8–10.8)

## 2023-12-19 LAB — LIPID PANEL
Cholesterol: 205 mg/dL — ABNORMAL HIGH (ref <200)
HDL: 63 mg/dL (ref >=50)
LDL Cholesterol (Calc): 124 mg/dL — ABNORMAL HIGH
Non-HDL Cholesterol (Calc): 142 mg/dL — ABNORMAL HIGH (ref <130)
Total CHOL/HDL Ratio: 3.3 (calc) (ref <5.0)
Triglycerides: 85 mg/dL (ref <150)

## 2023-12-19 LAB — TSH: TSH: 3.88 m[IU]/L

## 2023-12-25 ENCOUNTER — Ambulatory Visit (INDEPENDENT_AMBULATORY_CARE_PROVIDER_SITE_OTHER): Payer: Managed Care, Other (non HMO) | Admitting: Family Medicine

## 2023-12-25 ENCOUNTER — Other Ambulatory Visit: Payer: Self-pay | Admitting: Family Medicine

## 2023-12-25 ENCOUNTER — Encounter: Payer: Self-pay | Admitting: Family Medicine

## 2023-12-25 VITALS — BP 114/68 | HR 70 | Ht 69.0 in | Wt 168.0 lb

## 2023-12-25 DIAGNOSIS — Z0000 Encounter for general adult medical examination without abnormal findings: Secondary | ICD-10-CM

## 2023-12-25 DIAGNOSIS — Z1211 Encounter for screening for malignant neoplasm of colon: Secondary | ICD-10-CM | POA: Diagnosis not present

## 2023-12-25 DIAGNOSIS — E7800 Pure hypercholesterolemia, unspecified: Secondary | ICD-10-CM

## 2023-12-25 DIAGNOSIS — I10 Essential (primary) hypertension: Secondary | ICD-10-CM

## 2023-12-25 DIAGNOSIS — R7309 Other abnormal glucose: Secondary | ICD-10-CM

## 2023-12-25 MED ORDER — LOSARTAN POTASSIUM 25 MG PO TABS: 25.0000 mg | ORAL_TABLET | Freq: Every day | ORAL | 3 refills | Status: DC

## 2023-12-25 NOTE — Patient Instructions (Addendum)
 Thank you for coming to the office today.  Mild elevated ALT liver enzyme. Not a significant concern. May be due to mild elevated cholesterol.  Cholesterol is not in a range that requires treatment.  The 10-year ASCVD risk score (Arnett DK, et al., 2019) is: 2.2%   Values used to calculate the score:     Age: 53 years     Sex: Female     Is Non-Hispanic African American: No     Diabetic: No     Tobacco smoker: No     Systolic Blood Pressure: 142 mmHg     Is BP treated: Yes     HDL Cholesterol: 63 mg/dL     Total Cholesterol: 205 mg/dL   Notify me in the next 6+ months and we can order Cologuard WHEN - Ordered the Cologuard (home kit) test for colon cancer screening. Stay tuned for further updates.  It will be shipped to you directly. If not received in 2-4 weeks, call us  or the company.   If you send it back and no results are received in 2-4 weeks, call us  or the company as well!   Colon Cancer Screening: - For all adults age 45+ routine colon cancer screening is highly recommended.     - Recent guidelines from American Cancer Society recommend starting age of 52 - Early detection of colon cancer is important, because often there are no warning signs or symptoms, also if found early usually it can be cured. Late stage is hard to treat.   - If Cologuard is NEGATIVE, then it is good for 3 years before next due - If Cologuard is POSITIVE, then it is strongly advised to get a Colonoscopy, which allows the GI doctor to locate the source of the cancer or polyp (even very early stage) and treat it by removing it. ------------------------- Follow instructions to collect sample, you may call the company for any help or questions, 24/7 telephone support at [phone].   CONSIDER Coronary Calcium Score Cardiac CT Scan. This is a screening test for patients aged 21-50+ with cardiovascular risk factors or who are healthy but would be interested in Cardiovascular Screening for heart  disease. Even if there is a family history of heart disease, this imaging can be useful. Typically it can be done every 5+ years or at a different timeline we agree on  The scan will look at the chest and mainly focus on the heart and identify early signs of calcium build up or blockages within the heart arteries. It is not 100% accurate for identifying blockages or heart disease, but it is useful to help us  predict who may have some early changes or be at risk in the future for a heart attack or cardiovascular problem.  The results are reviewed by a Cardiologist and they will document the results. It should become available on MyChart. Typically the results are divided into percentiles based on other patients of the same demographic and age. So it will compare your risk to others similar to you. If you have a higher score >99 or higher percentile >75%tile, it is recommended to consider Statin cholesterol therapy and or referral to Cardiologist. I will try to help explain your results and if we have questions we can contact the Cardiologist.  You will be contacted for scheduling. Usually it is done at any imaging facility through [facility], [facility] or [facility].  The cost is $99 flat fee total and it does not  go through insurance, so no authorization is required.  DUE for FASTING BLOOD WORK (no food or drink after midnight before the lab appointment, only water or coffee without cream/sugar on the morning of)  SCHEDULE "Lab Only" visit in the morning at the clinic for lab draw in 1 YEAR  - Make sure Lab Only appointment is at about 1 week before your next appointment, so that results will be available  For Lab Results, once available within 2-3 days of blood draw, you can can log in to MyChart online to view your results and a brief explanation. Also, we can discuss results at next follow-up visit.   Please schedule a Follow-up Appointment to: Return in about  1 year (around 12/24/2024) for 1 year fasting lab > 1 week later Annual Physical.  If you have any other questions or concerns, please feel free to call the office or send a message through MyChart. You may also schedule an earlier appointment if necessary.  Additionally, you may be receiving a survey about your experience at our office within a few days to 1 week by e-mail or mail. We value your feedback.  Domingo Friend, DO [facility], [place]

## 2023-12-25 NOTE — Progress Notes (Signed)
 Subjective:    Patient ID: Sheryl Carr, female    DOB: 12-Mar-1971, 53 y.o.   MRN: [id number]  Sheryl Carr is a 53 y.o. female presenting on 12/25/2023 for Annual Exam   HPI  Discussed the use of AI scribe software for clinical note transcription with the patient, who gave verbal consent to proceed.  History of Present Illness     Here for Annual Physical and lab review.   Wellness / Lifestyle BMI >24   Elevated LDL Last lab LDL 120s Not on therapy  CHRONIC HTN: No new concerns. Initial reading high but repeat improved. Not measuring BP at home Current Meds - Losartan  25mg  daily Reports good compliance, took meds today. Tolerating well, w/o complaints. Denies CP, dyspnea, HA, edema, dizziness / lightheadedness  Osteoarthritis multiple joints Joint pain episodic, hands wrist mostly History of ganglion cyst has resolved Rarely using topical Diclofenac    PMH Urinary Frequency / Urge / Overactive Bladder    Anxiety Chronic gradual problem that can flare at times. Not interested in rx medication. Asking about self treatment remedy options with CBD.    Health Maintenance:   Colonoscopy completed 11/02/19, Dr Tully Gainer [facility] - x 3 polyps all negative no adenoma, only 1 hyperplastic. Repeat in 5 years. 2025   Last Mammogram 11/21/23 negative.    COVID Vaccine status - Pfizer 02/18/20 and 03/10/20. Considering booster.     12/25/2023    1:57 PM 12/25/2023    1:56 PM 06/19/2023    8:19 AM  Depression screen PHQ 2/9  Decreased Interest 0 0 0  Down, Depressed, Hopeless 1 1 0  PHQ - 2 Score 1 1 0  Altered sleeping 0    Tired, decreased energy 0    Change in appetite 0    Feeling bad or failure about yourself  0    Trouble concentrating 1    Moving slowly or fidgety/restless 0    Suicidal thoughts 0    PHQ-9 Score 2         12/25/2023    1:57 PM 12/18/2021    1:40 PM  GAD 7 : Generalized Anxiety Score  Nervous, Anxious, on Edge 1 1  Control/stop  worrying 0 1  Worry too much - different things 0 0  Trouble relaxing 0 0  Restless 0 0  Easily annoyed or irritable 0 1  Afraid - awful might happen 0 0  Total GAD 7 Score 1 3  Anxiety Difficulty  Not difficult at all     Past Medical History:  Diagnosis Date   Anemia    Chronic kidney disease    H/O KIDNEY STONES   Endometrial polyp 2016   hysteroscopically removed   GERD (gastroesophageal reflux disease)    H/O mammogram 05/11/2015   Hematuria    History of kidney stones    History of uterine fibroid    s/p hysterectomy 08/2017   HTN (hypertension)    PT WAS TAKEN OFF HER BP MED IN MAY/JUNE OF 2016 BUT BP IS ELEVATED AGAIN   Past Surgical History:  Procedure Laterality Date   ABDOMINAL HYSTERECTOMY     BREAST CYST ASPIRATION Right 12/24/2019   CESAREAN SECTION     COLONOSCOPY WITH PROPOFOL  N/A 11/02/2019   Procedure: COLONOSCOPY WITH PROPOFOL ;  Surgeon: Irby Mannan, MD;  Location: [facility];  Service: Endoscopy;  Laterality: N/A;   HYSTERECTOMY ABDOMINAL WITH SALPINGECTOMY Bilateral 08/19/2017   Procedure: HYSTERECTOMY ABDOMINAL WITH BILATERAL SALPINGECTOMY;  Surgeon: Teresa Fender, MD;  Location: [facility];  Service: Gynecology;  Laterality: Bilateral;   HYSTEROSCOPY WITH D & C N/A 11/14/2015   Procedure: DILATATION AND CURETTAGE /HYSTEROSCOPY;  Surgeon: Teresa Fender, MD;  Location: [facility];  Service: Gynecology;  Laterality: N/A;   KIDNEY STONE SURGERY     Social History   Socioeconomic History   Marital status: Married    Spouse name: Sheryl Carr   Number of children: 2   Years of education: college   Highest education level: Not on file  Occupational History   Occupation: n/a    Employer: [employer]  Tobacco Use   Smoking status: Former    Current packs/day: 0.00    Types: Cigarettes    Quit date: 09/24/1990    Years since quitting: 33.2   Smokeless tobacco: Former  Building services engineer status: Never Used  Substance and Sexual Activity    Alcohol use: Yes    Alcohol/week: 2.0 - 3.0 standard drinks of alcohol    Types: 2 - 3 Standard drinks or equivalent per week    Comment: social   Drug use: No   Sexual activity: Yes    Birth control/protection: None  Other Topics Concern   Not on file  Social History Narrative   ** Merged History Encounter **       Patient is married Therapist, art) and lives at home with her husband and one child, one child is in college.   Patient is working full-time.   Patient has a college education.   Patient is right-handed.   Patient drinks 1-2 cups of coffee daily, soda and tea are occasionally, not daily.   Social Drivers of Corporate investment banker Strain: Not on file  Food Insecurity: Not on file  Transportation Needs: Not on file  Physical Activity: Not on file  Stress: Not on file  Social Connections: Not on file  Intimate Partner Violence: Not on file   Family History  Problem Relation Age of Onset   Diabetes Mellitus I Mother    Diabetes Mellitus I Maternal Grandmother    Prostate cancer Maternal Grandfather    Diabetes Mellitus I Paternal Grandmother    Breast cancer Neg Hx    Current Outpatient Medications on File Prior to Visit  Medication Sig   diclofenac  Sodium (VOLTAREN ) 1 % GEL Apply 2 g topically 4 (four) times daily as needed.   econazole nitrate 1 % cream    Multiple Vitamins-Calcium (ONE-A-DAY WOMENS FORMULA PO) Take 1 tablet by mouth daily.    No current facility-administered medications on file prior to visit.    Review of Systems  Constitutional:  Negative for activity change, appetite change, chills, diaphoresis, fatigue and fever.  HENT:  Negative for congestion and hearing loss.   Eyes:  Negative for visual disturbance.  Respiratory:  Negative for cough, chest tightness, shortness of breath and wheezing.   Cardiovascular:  Negative for chest pain, palpitations and leg swelling.  Gastrointestinal:  Negative for abdominal pain, constipation, diarrhea,  nausea and vomiting.  Genitourinary:  Negative for dysuria, frequency and hematuria.  Musculoskeletal:  Negative for arthralgias and neck pain.  Skin:  Negative for rash.  Neurological:  Negative for dizziness, weakness, light-headedness, numbness and headaches.  Hematological:  Negative for adenopathy.  Psychiatric/Behavioral:  Negative for behavioral problems, dysphoric mood and sleep disturbance.    Per HPI unless specifically indicated above     Objective:    BP 114/68 (BP Location: Left Arm, Cuff Size: Normal)   Pulse 70  Ht 5\' 9"  (1.753 m)   Wt 168 lb (76.2 kg)   LMP 08/09/2017 (Exact Date)   SpO2 98%   BMI 24.81 kg/m   Wt Readings from Last 3 Encounters:  12/25/23 168 lb (76.2 kg)  06/19/23 168 lb 9.6 oz (76.5 kg)  12/17/22 165 lb 3.2 oz (74.9 kg)    Physical Exam Vitals and nursing note reviewed.  Constitutional:      General: She is not in acute distress.    Appearance: She is well-developed. She is not diaphoretic.     Comments: Well-appearing, comfortable, cooperative  HENT:     Head: Normocephalic and atraumatic.  Eyes:     General:        Right eye: No discharge.        Left eye: No discharge.     Conjunctiva/sclera: Conjunctivae normal.     Pupils: Pupils are equal, round, and reactive to light.  Neck:     Thyroid : No thyromegaly.     Vascular: No carotid bruit.  Cardiovascular:     Rate and Rhythm: Normal rate and regular rhythm.     Pulses: Normal pulses.     Heart sounds: Normal heart sounds. No murmur heard. Pulmonary:     Effort: Pulmonary effort is normal. No respiratory distress.     Breath sounds: Normal breath sounds. No wheezing or rales.  Abdominal:     General: Bowel sounds are normal. There is no distension.     Palpations: Abdomen is soft. There is no mass.     Tenderness: There is no abdominal tenderness.  Musculoskeletal:        General: No tenderness. Normal range of motion.     Cervical back: Normal range of motion and neck  supple.     Right lower leg: No edema.     Left lower leg: No edema.     Comments: Upper / Lower Extremities: - Normal muscle tone, strength bilateral upper extremities 5/5, lower extremities 5/5  Lymphadenopathy:     Cervical: No cervical adenopathy.  Skin:    General: Skin is warm and dry.     Findings: No erythema or rash.  Neurological:     Mental Status: She is alert and oriented to person, place, and time.     Comments: Distal sensation intact to light touch all extremities  Psychiatric:        Mood and Affect: Mood normal.        Behavior: Behavior normal.        Thought Content: Thought content normal.     Comments: Well groomed, good eye contact, normal speech and thoughts     Results for orders placed or performed in visit on 12/18/23  TSH   Collection Time: 12/18/23  8:42 AM  Result Value Ref Range   TSH 3.88 mIU/L  Hemoglobin A1c   Collection Time: 12/18/23  8:42 AM  Result Value Ref Range   Hgb A1c MFr Bld 5.1 <5.7 % of total Hgb   Mean Plasma Glucose 100 mg/dL   eAG (mmol/L) 5.5 mmol/L  Lipid panel   Collection Time: 12/18/23  8:42 AM  Result Value Ref Range   Cholesterol 205 (H) <200 mg/dL   HDL 63 > OR = 50 mg/dL   Triglycerides 85 <098 mg/dL   LDL Cholesterol (Calc) 124 (H) mg/dL (calc)   Total CHOL/HDL Ratio 3.3 <5.0 (calc)   Non-HDL Cholesterol (Calc) 142 (H) <130 mg/dL (calc)  CBC with Differential/Platelet   Collection Time:  12/18/23  8:42 AM  Result Value Ref Range   WBC 5.5 3.8 - 10.8 Thousand/uL   RBC 4.63 3.80 - 5.10 Million/uL   Hemoglobin 13.4 11.7 - 15.5 g/dL   HCT 19.1 47.8 - 29.5 %   MCV 89.0 80.0 - 100.0 fL   MCH 28.9 27.0 - 33.0 pg   MCHC 32.5 32.0 - 36.0 g/dL   RDW 62.1 30.8 - 65.7 %   Platelets 257 140 - 400 Thousand/uL   MPV 10.3 7.5 - 12.5 fL   Neutro Abs 3,542 1,500 - 7,800 cells/uL   Absolute Lymphocytes 1,331 850 - 3,900 cells/uL   Absolute Monocytes 440 200 - 950 cells/uL   Eosinophils Absolute 138 15 - 500 cells/uL    Basophils Absolute 50 0 - 200 cells/uL   Neutrophils Relative % 64.4 %   Total Lymphocyte 24.2 %   Monocytes Relative 8.0 %   Eosinophils Relative 2.5 %   Basophils Relative 0.9 %  COMPLETE METABOLIC PANEL WITH GFR   Collection Time: 12/18/23  8:42 AM  Result Value Ref Range   Glucose, Bld 99 65 - 99 mg/dL   BUN 22 7 - 25 mg/dL   Creat 8.46 9.62 - 9.52 mg/dL   eGFR 90 > OR = 60 WU/XLK/4.40N0   BUN/Creatinine Ratio SEE NOTE: 6 - 22 (calc)   Sodium 139 135 - 146 mmol/L   Potassium 4.5 3.5 - 5.3 mmol/L   Chloride 103 98 - 110 mmol/L   CO2 26 20 - 32 mmol/L   Calcium 9.4 8.6 - 10.4 mg/dL   Total Protein 6.7 6.1 - 8.1 g/dL   Albumin 4.4 3.6 - 5.1 g/dL   Globulin 2.3 1.9 - 3.7 g/dL (calc)   AG Ratio 1.9 1.0 - 2.5 (calc)   Total Bilirubin 0.6 0.2 - 1.2 mg/dL   Alkaline phosphatase (APISO) 63 37 - 153 U/L   AST 21 10 - 35 U/L   ALT 42 (H) 6 - 29 U/L      Assessment & Plan:   Problem List Items Addressed This Visit     Elevated LDL cholesterol level   Essential hypertension   Relevant Medications   losartan  (COZAAR ) 25 MG tablet   Screening for colon cancer   Relevant Orders   Cologuard   Other Visit Diagnoses       Annual physical exam    -  Primary        Updated Health Maintenance information Reviewed recent lab results with patient Encouraged improvement to lifestyle with diet and exercise Goal of weight loss  Hypertension Blood pressure slightly elevated at 142/88 initial reading. Repeat manual reading is completely normal range. -Continue Losartan  25mg  daily. -Check blood pressure once or twice a month at home and report any trends.  Mildly elevated ALT Likely due to mildly elevated cholesterol. No significant concern at this time. No treatment required -Monitor ALT levels for any trends.  Hyperlipidemia LDL slightly above target. At 124 currently. not at a level requiring pharmacological intervention. -Continue lifestyle modifications to manage  cholesterol levels.  The 10-year ASCVD risk score (Arnett DK, et al., 2019) is: 1.4%   Values used to calculate the score:     Age: 70 years     Sex: Female     Is Non-Hispanic African American: No     Diabetic: No     Tobacco smoker: No     Systolic Blood Pressure: 114 mmHg     Is BP treated: Yes  HDL Cholesterol: 63 mg/dL     Total Cholesterol: 205 mg/dL  Anxiety Reports feeling overwhelmed frequently. Discussed potential use of low-dose CBD for its calming effects. -Consider low-dose CBD for anxiety management. Discussed medication options as well in future if interested  Osteoarthritis Reports occasional pain in hands. Currently using Voltaren  for symptom management. -Continue Voltaren  topical AS NEEDED as needed for arthritis pain.  Colon CA Scree  Due for routine colon cancer screening in 10/2024. Last Colonoscopy 10/2019 without any pre-cancerous polyp, was given 5 year call back. Advised that I would recommend Cologuard as next option. no family history colon cancer.  - Discussion today about recommendations for either Colonoscopy or Cologuard screening, benefits and risks of screening, interested in Cologuard, understands that if positive then recommendation is for diagnostic colonoscopy to follow-up. - Ordered Cologuard today - complete kit within next 6+ months   General Health Maintenance / Followup Plans:  -Mammogram:Last mammogram on 11/21/2023 was negative. Continue annual mammograms.  -Pneumonia vaccine:Discussed potential future recommendation for pneumonia vaccine for patients 50+.  -Heart scan:Discussed potential benefit of Coronary Artery Calcium Score heart scan for patients aged 70-75 with any risk factor. Patient to consider.    Orders Placed This Encounter  Procedures   Cologuard    Meds ordered this encounter  Medications   losartan  (COZAAR ) 25 MG tablet    Sig: Take 1 tablet (25 mg total) by mouth daily.    Dispense:  90 tablet     Refill:  3     Follow up plan: Return in about 1 year (around 12/24/2024) for 1 year fasting lab > 1 week later Annual Physical.  Future labs 12/17/24  Domingo Friend, DO [facility] 12/25/2023, 2:04 PM

## 2023-12-26 ENCOUNTER — Encounter: Payer: Self-pay | Admitting: Family Medicine

## 2023-12-26 DIAGNOSIS — Z789 Other specified health status: Secondary | ICD-10-CM

## 2023-12-26 NOTE — Addendum Note (Signed)
Addended by: Smitty Cords on: 12/26/2023 03:15 PM   Modules accepted: Orders

## 2024-03-18 ENCOUNTER — Encounter: Payer: Self-pay | Admitting: Family Medicine

## 2024-03-18 ENCOUNTER — Ambulatory Visit: Admitting: Family Medicine

## 2024-03-18 VITALS — BP 122/80 | HR 73 | Ht 69.0 in | Wt 167.0 lb

## 2024-03-18 DIAGNOSIS — L723 Sebaceous cyst: Secondary | ICD-10-CM

## 2024-03-18 DIAGNOSIS — L089 Local infection of the skin and subcutaneous tissue, unspecified: Secondary | ICD-10-CM

## 2024-03-18 MED ORDER — MUPIROCIN 2 % EX OINT: 1.0000 | TOPICAL_OINTMENT | Freq: Two times a day (BID) | CUTANEOUS | 0 refills | Status: DC

## 2024-03-18 NOTE — Progress Notes (Signed)
 Subjective:    Patient ID: Sheryl Carr, female    DOB: 1971/04/23, 53 y.o.   MRN: [id number]  Sheryl Carr is a 53 y.o. female presenting on 03/18/2024 for Breast abnormality  Patient presents for a same day appointment.   HPI  Discussed the use of AI scribe software for clinical note transcription with the patient, who gave verbal consent to proceed.  History of Present Illness   Sheryl Carr "Sheryl Carr" is a 53 year old female who presents with a persistent blemish on her right breast.  She noticed the blemish over two weeks ago during a routine self-examination in the shower. It is located on the right lower quadrant of the breast, around the 7 o'clock position, and is swollen and red. Initially, it appeared open and wet after showering, resembling a skin sore, but this has not been observed in the past few days. It does not cause significant pain and is not itchy or draining. Her husband noted that the blemish does not seem as wide as it initially was, but it remains present and has not completely resolved. She has not applied any topical treatments to the area yet.  In her past medical history, she had an abnormal mammogram in 2019, which was monitored with diagnostic imaging and determined to be benign calcification. She also had a duct drained on the same side in the past. The current blemish is described as being on the outside of the breast, unlike previous issues which were deeper. Her last mammogram in December 2024 was normal. She has had an ultrasound in the past for a similar issue, which identified a benign cyst.  There is no family history of breast cancer.           03/18/2024    1:27 PM 12/25/2023    1:57 PM 12/25/2023    1:56 PM  Depression screen PHQ 2/9  Decreased Interest 0 0 0  Down, Depressed, Hopeless 1 1 1   PHQ - 2 Score 1 1 1   Altered sleeping 0 0   Tired, decreased energy 1 0   Change in appetite 0 0   Feeling bad or failure about  yourself  0 0   Trouble concentrating 0 1   Moving slowly or fidgety/restless 0 0   Suicidal thoughts 0 0   PHQ-9 Score 2 2   Difficult doing work/chores Not difficult at all         03/18/2024    1:27 PM 12/25/2023    1:57 PM 12/18/2021    1:40 PM  GAD 7 : Generalized Anxiety Score  Nervous, Anxious, on Edge 1 1 1   Control/stop worrying 1 0 1  Worry too much - different things 1 0 0  Trouble relaxing 0 0 0  Restless 0 0 0  Easily annoyed or irritable 1 0 1  Afraid - awful might happen 0 0 0  Total GAD 7 Score 4 1 3   Anxiety Difficulty Not difficult at all  Not difficult at all    Social History   Tobacco Use   Smoking status: Former    Current packs/day: 0.00    Types: Cigarettes    Quit date: 09/24/1990    Years since quitting: 33.5   Smokeless tobacco: Former  Building services engineer status: Never Used  Substance Use Topics   Alcohol use: Yes    Alcohol/week: 2.0 - 3.0 standard drinks of alcohol    Types: 2 - 3 Standard drinks  or equivalent per week    Comment: social   Drug use: No   Family History  Problem Relation Age of Onset   Diabetes Mellitus I Mother    Diabetes Mellitus I Maternal Grandmother    Prostate cancer Maternal Grandfather    Diabetes Mellitus I Paternal Grandmother    Breast cancer Neg Hx     Review of Systems Per HPI unless specifically indicated above     Objective:    BP 122/80 (BP Location: Right Arm, Patient Position: Sitting, Cuff Size: Normal)   Pulse 73   Ht 5\' 9"  (1.753 m)   Wt 167 lb (75.8 kg)   LMP 08/09/2017 (Exact Date)   SpO2 100%   BMI 24.66 kg/m   Wt Readings from Last 3 Encounters:  03/18/24 167 lb (75.8 kg)  12/25/23 168 lb (76.2 kg)  06/19/23 168 lb 9.6 oz (76.5 kg)    Physical Exam Vitals and nursing note reviewed.  Constitutional:      General: She is not in acute distress.    Appearance: Normal appearance. She is well-developed. She is not diaphoretic.     Comments: Well-appearing, comfortable, cooperative   HENT:     Head: Normocephalic and atraumatic.  Eyes:     General:        Right eye: No discharge.        Left eye: No discharge.     Conjunctiva/sclera: Conjunctivae normal.  Cardiovascular:     Rate and Rhythm: Normal rate.  Pulmonary:     Effort: Pulmonary effort is normal.  Chest:    Skin:    General: Skin is warm and dry.     Findings: No erythema or rash.  Neurological:     Mental Status: She is alert and oriented to person, place, and time.  Psychiatric:        Mood and Affect: Mood normal.        Behavior: Behavior normal.        Thought Content: Thought content normal.     Comments: Well groomed, good eye contact, normal speech and thoughts    Note breast exam was chaperoned by Luanna Cole CMA. Patient remained mostly covered except for area in question.  Results for orders placed or performed in visit on 12/18/23  TSH   Collection Time: 12/18/23  8:42 AM  Result Value Ref Range   TSH 3.88 mIU/L  Hemoglobin A1c   Collection Time: 12/18/23  8:42 AM  Result Value Ref Range   Hgb A1c MFr Bld 5.1 <5.7 % of total Hgb   Mean Plasma Glucose 100 mg/dL   eAG (mmol/L) 5.5 mmol/L  Lipid panel   Collection Time: 12/18/23  8:42 AM  Result Value Ref Range   Cholesterol 205 (H) <200 mg/dL   HDL 63 > OR = 50 mg/dL   Triglycerides 85 <161 mg/dL   LDL Cholesterol (Calc) 124 (H) mg/dL (calc)   Total CHOL/HDL Ratio 3.3 <5.0 (calc)   Non-HDL Cholesterol (Calc) 142 (H) <130 mg/dL (calc)  CBC with Differential/Platelet   Collection Time: 12/18/23  8:42 AM  Result Value Ref Range   WBC 5.5 3.8 - 10.8 Thousand/uL   RBC 4.63 3.80 - 5.10 Million/uL   Hemoglobin 13.4 11.7 - 15.5 g/dL   HCT 09.6 04.5 - 40.9 %   MCV 89.0 80.0 - 100.0 fL   MCH 28.9 27.0 - 33.0 pg   MCHC 32.5 32.0 - 36.0 g/dL   RDW 81.1 91.4 - 78.2 %  Platelets 257 140 - 400 Thousand/uL   MPV 10.3 7.5 - 12.5 fL   Neutro Abs 3,542 1,500 - 7,800 cells/uL   Absolute Lymphocytes 1,331 850 - 3,900 cells/uL    Absolute Monocytes 440 200 - 950 cells/uL   Eosinophils Absolute 138 15 - 500 cells/uL   Basophils Absolute 50 0 - 200 cells/uL   Neutrophils Relative % 64.4 %   Total Lymphocyte 24.2 %   Monocytes Relative 8.0 %   Eosinophils Relative 2.5 %   Basophils Relative 0.9 %  COMPLETE METABOLIC PANEL WITH GFR   Collection Time: 12/18/23  8:42 AM  Result Value Ref Range   Glucose, Bld 99 65 - 99 mg/dL   BUN 22 7 - 25 mg/dL   Creat 1.91 4.78 - 2.95 mg/dL   eGFR 90 > OR = 60 AO/ZHY/8.65H8   BUN/Creatinine Ratio SEE NOTE: 6 - 22 (calc)   Sodium 139 135 - 146 mmol/L   Potassium 4.5 3.5 - 5.3 mmol/L   Chloride 103 98 - 110 mmol/L   CO2 26 20 - 32 mmol/L   Calcium 9.4 8.6 - 10.4 mg/dL   Total Protein 6.7 6.1 - 8.1 g/dL   Albumin 4.4 3.6 - 5.1 g/dL   Globulin 2.3 1.9 - 3.7 g/dL (calc)   AG Ratio 1.9 1.0 - 2.5 (calc)   Total Bilirubin 0.6 0.2 - 1.2 mg/dL   Alkaline phosphatase (APISO) 63 37 - 153 U/L   AST 21 10 - 35 U/L   ALT 42 (H) 6 - 29 U/L      Assessment & Plan:   Problem List Items Addressed This Visit   None Visit Diagnoses       Infected sebaceous cyst of skin    -  Primary   Relevant Medications   mupirocin ointment (BACTROBAN) 2 %       Superficial Skin Lesion on Right Breast Suspected superficial blemish, possibly a clogged pore or infected cyst. Differential includes superficial skin infection or irritation. Initial treatment with topical antibiotics preferred. - Prescribed prescription-strength topical antibiotic ointment, apply twice daily for 7-10 days. - Monitor for spreading erythema, increased pain, drainage, or fever. - Consider dermatology or imaging if no improvement.  General Health Maintenance Last mammogram in December 2024 was normal. No family history of breast cancer. Previous abnormal mammogram in 2019 was benign. - Continue regular mammogram schedule, next due in winter.        No orders of the defined types were placed in this  encounter.   Meds ordered this encounter  Medications   mupirocin ointment (BACTROBAN) 2 %    Sig: Apply 1 Application topically 2 (two) times daily. For 1-2 weeks, then as needed.    Dispense:  22 g    Refill:  0    Follow up plan: Return if symptoms worsen or fail to improve.   Saralyn Pilar, DO [facility] 03/18/2024, 1:43 PM

## 2024-03-18 NOTE — Patient Instructions (Addendum)
 Thank you for coming to the office today.  It does look like a small superficial infected skin nodule or spot / cyst. It does catch your attention that it is abnormal so I agree that it is wise to evaluate this.  Use the topical rx Mupicoin strong antibiotic ointment twice a day for 7-10 days and see if it resolves fully.  If it keeps coming back or more spots or new concerns or lumps or spreading redness.   If not improving you may need to return for re-evaluation. But if more severe worsening such as spreading redness or streaking redness, significantly larger size, persistent drainage of pus, increased pain, fevers/chills  Can consider Dermatology re-evaluation if persistent or similar recurrence  Consider Ultrasound sooner if need but again seems less likely I agree this is more supreficial  Please schedule a Follow-up Appointment to: Return if symptoms worsen or fail to improve.  If you have any other questions or concerns, please feel free to call the office or send a message through MyChart. You may also schedule an earlier appointment if necessary.  Additionally, you may be receiving a survey about your experience at our office within a few days to 1 week by e-mail or mail. We value your feedback.  Saralyn Pilar, DO [facility], [place]

## 2024-04-10 ENCOUNTER — Ambulatory Visit (INDEPENDENT_AMBULATORY_CARE_PROVIDER_SITE_OTHER): Admitting: Family Medicine

## 2024-04-10 ENCOUNTER — Encounter: Payer: Self-pay | Admitting: Family Medicine

## 2024-04-10 VITALS — BP 120/78 | HR 73 | Ht 69.0 in | Wt 167.4 lb

## 2024-04-10 DIAGNOSIS — R21 Rash and other nonspecific skin eruption: Secondary | ICD-10-CM | POA: Diagnosis not present

## 2024-04-10 NOTE — Progress Notes (Signed)
 Subjective:    Patient ID: Sheryl Carr, female    DOB: [birth year], 53 y.o.   MRN: [id number]  Sheryl Carr is a 53 y.o. female presenting on 04/10/2024 for Rash  Patient presents for a same day appointment.   HPI  Discussed the use of AI scribe software for clinical note transcription with the patient, who gave verbal consent to proceed.  History of Present Illness   Sheryl Carr "Carlon Chester" is a 53 year old female who presents with a new onset facial rash and itching.  She experienced intense itching on the right side of her face, particularly around the bridge of her nose and temple area, upon waking up last night. Initially suspecting a bug bite, she noticed a rash in the same area this morning, which is itchy but not painful. She has not applied any treatments yet and is avoiding touching the area.  There is no drainage, pus, infection, liquid, oozing, or blistering from the rash. No associated burning, stinging, or pressure. The rash is mild but noticeable, with intermittent itching in different areas of the face.  She has a history of skin reactions to plants, requiring her to wear gloves while gardening, but denies any significant exposure to plants recently, except for watering a fern. She also denies any known bug bites and notes that the rash appeared before she went for a walk yesterday evening.  She has a history of a persistent rash on her breast, which has not resolved after three weeks. She was previously given Bactroban  for this, which was thought to be a blemish or clogged pore.  No other rashes on her body, throat swelling, or shortness of breath. She notes question about possible mild sore throat, possibly related to her daughter's sore throat, but no significant symptoms.         03/18/2024    1:27 PM 12/25/2023    1:57 PM 12/25/2023    1:56 PM  Depression screen PHQ 2/9  Decreased Interest 0 0 0  Down, Depressed, Hopeless 1 1 1   PHQ - 2 Score 1  1 1   Altered sleeping 0 0   Tired, decreased energy 1 0   Change in appetite 0 0   Feeling bad or failure about yourself  0 0   Trouble concentrating 0 1   Moving slowly or fidgety/restless 0 0   Suicidal thoughts 0 0   PHQ-9 Score 2 2   Difficult doing work/chores Not difficult at all         03/18/2024    1:27 PM 12/25/2023    1:57 PM 12/18/2021    1:40 PM  GAD 7 : Generalized Anxiety Score  Nervous, Anxious, on Edge 1 1 1   Control/stop worrying 1 0 1  Worry too much - different things 1 0 0  Trouble relaxing 0 0 0  Restless 0 0 0  Easily annoyed or irritable 1 0 1  Afraid - awful might happen 0 0 0  Total GAD 7 Score 4 1 3   Anxiety Difficulty Not difficult at all  Not difficult at all    Social History   Tobacco Use   Smoking status: Former    Current packs/day: 0.00    Types: Cigarettes    Quit date: 09/24/1990    Years since quitting: 33.5   Smokeless tobacco: Former  Building services engineer status: Never Used  Substance Use Topics   Alcohol use: Yes    Alcohol/week: 2.0 -  3.0 standard drinks of alcohol    Types: 2 - 3 Standard drinks or equivalent per week    Comment: social   Drug use: No    Review of Systems Per HPI unless specifically indicated above     Objective:    BP 120/78 (BP Location: Left Arm, Patient Position: Sitting, Cuff Size: Normal)   Pulse 73   Ht 5\' 9"  (1.753 m)   Wt 167 lb 6 oz (75.9 kg)   LMP 08/09/2017 (Exact Date)   SpO2 97%   BMI 24.72 kg/m   Wt Readings from Last 3 Encounters:  04/10/24 167 lb 6 oz (75.9 kg)  03/18/24 167 lb (75.8 kg)  12/25/23 168 lb (76.2 kg)    Physical Exam Vitals and nursing note reviewed.  Constitutional:      General: She is not in acute distress.    Appearance: Normal appearance. She is well-developed. She is not diaphoretic.     Comments: Well-appearing, comfortable, cooperative  HENT:     Head: Normocephalic and atraumatic.  Eyes:     General:        Right eye: No discharge.        Left eye:  No discharge.     Extraocular Movements: Extraocular movements intact.     Conjunctiva/sclera: Conjunctivae normal.     Pupils: Pupils are equal, round, and reactive to light.     Comments: Left upper eyelid inner portion with very slight edema but no stye or hordeolum. No discharge or drainage. No erythema or warmth or pain  Cardiovascular:     Rate and Rhythm: Normal rate.  Pulmonary:     Effort: Pulmonary effort is normal.  Skin:    General: Skin is warm and dry.     Findings: Rash (R side of face temple region with scattered mild erythematous areas of induration patchy, no rash on surface of skin no vesicles or blisters no break in skin, no pustule or pimples.) present. No erythema.  Neurological:     Mental Status: She is alert and oriented to person, place, and time.  Psychiatric:        Mood and Affect: Mood normal.        Behavior: Behavior normal.        Thought Content: Thought content normal.     Comments: Well groomed, good eye contact, normal speech and thoughts     Results for orders placed or performed in visit on 12/18/23  TSH   Collection Time: 12/18/23  8:42 AM  Result Value Ref Range   TSH 3.88 mIU/L  Hemoglobin A1c   Collection Time: 12/18/23  8:42 AM  Result Value Ref Range   Hgb A1c MFr Bld 5.1 <5.7 % of total Hgb   Mean Plasma Glucose 100 mg/dL   eAG (mmol/L) 5.5 mmol/L  Lipid panel   Collection Time: 12/18/23  8:42 AM  Result Value Ref Range   Cholesterol 205 (H) <200 mg/dL   HDL 63 > OR = 50 mg/dL   Triglycerides 85 <161 mg/dL   LDL Cholesterol (Calc) 124 (H) mg/dL (calc)   Total CHOL/HDL Ratio 3.3 <5.0 (calc)   Non-HDL Cholesterol (Calc) 142 (H) <130 mg/dL (calc)  CBC with Differential/Platelet   Collection Time: 12/18/23  8:42 AM  Result Value Ref Range   WBC 5.5 3.8 - 10.8 Thousand/uL   RBC 4.63 3.80 - 5.10 Million/uL   Hemoglobin 13.4 11.7 - 15.5 g/dL   HCT 09.6 04.5 - 40.9 %   MCV  89.0 80.0 - 100.0 fL   MCH 28.9 27.0 - 33.0 pg   MCHC  32.5 32.0 - 36.0 g/dL   RDW 16.1 09.6 - 04.5 %   Platelets 257 140 - 400 Thousand/uL   MPV 10.3 7.5 - 12.5 fL   Neutro Abs 3,542 1,500 - 7,800 cells/uL   Absolute Lymphocytes 1,331 850 - 3,900 cells/uL   Absolute Monocytes 440 200 - 950 cells/uL   Eosinophils Absolute 138 15 - 500 cells/uL   Basophils Absolute 50 0 - 200 cells/uL   Neutrophils Relative % 64.4 %   Total Lymphocyte 24.2 %   Monocytes Relative 8.0 %   Eosinophils Relative 2.5 %   Basophils Relative 0.9 %  COMPLETE METABOLIC PANEL WITH GFR   Collection Time: 12/18/23  8:42 AM  Result Value Ref Range   Glucose, Bld 99 65 - 99 mg/dL   BUN 22 7 - 25 mg/dL   Creat 4.09 8.11 - 9.14 mg/dL   eGFR 90 > OR = 60 NW/GNF/6.21H0   BUN/Creatinine Ratio SEE NOTE: 6 - 22 (calc)   Sodium 139 135 - 146 mmol/L   Potassium 4.5 3.5 - 5.3 mmol/L   Chloride 103 98 - 110 mmol/L   CO2 26 20 - 32 mmol/L   Calcium 9.4 8.6 - 10.4 mg/dL   Total Protein 6.7 6.1 - 8.1 g/dL   Albumin 4.4 3.6 - 5.1 g/dL   Globulin 2.3 1.9 - 3.7 g/dL (calc)   AG Ratio 1.9 1.0 - 2.5 (calc)   Total Bilirubin 0.6 0.2 - 1.2 mg/dL   Alkaline phosphatase (APISO) 63 37 - 153 U/L   AST 21 10 - 35 U/L   ALT 42 (H) 6 - 29 U/L      Assessment & Plan:   Problem List Items Addressed This Visit   None Visit Diagnoses       Rash and nonspecific skin eruption    -  Primary        Allergic contact dermatitis Pruritic rash on right face, likely allergic contact dermatitis.  Shingles less likely due to lack of pain or vesicular rash and bilateral presentation.  - Apply OTC hydrocortisone cream to face twice daily. - Use cool compresses on eye to reduce swelling. - Monitor for pain, blistering, or shingles symptoms. - Contact provider if symptoms worsen or shingles suspected.  Previous problem as discussed localized skin lesion on breast thought to be folliculitis type skin lesion, tried topical mupirocin  without resolution. She has upcoming  dermatology consultation  in four to six weeks. Will keep apt and get their opinion, however not re-evaluated today but last visit 3-4 weeks ago did not determine that it was consistent with any breast pathology. Only superficial skin issue.      No orders of the defined types were placed in this encounter.   No orders of the defined types were placed in this encounter.   Follow up plan: Return if symptoms worsen or fail to improve.   Domingo Friend, DO [facility] 04/10/2024, 11:26 AM

## 2024-04-10 NOTE — Patient Instructions (Addendum)
 Thank you for coming to the office today.  Trial of OTC Cortizone topical on the side of the face or temple. Would not use Rx strength topical steroid at this time for the face  Eye lid and eye do not seem infected  Does not look like shingles Usually we would look for blistering pain nerve issues with burning stinging numb etc If it does change or get more severe, let me know ASAP, call on call,  May need Shingles treatment Valtrex  Otherwise, should use cool compress for the eye  Monitor for other allergy symptoms and reaction  Follow-up  Regarding the breast spot, talk to Dermatologist in a few weeks.   Please schedule a Follow-up Appointment to: Return if symptoms worsen or fail to improve.  If you have any other questions or concerns, please feel free to call the office or send a message through MyChart. You may also schedule an earlier appointment if necessary.  Additionally, you may be receiving a survey about your experience at our office within a few days to 1 week by e-mail or mail. We value your feedback.  Domingo Friend, DO [facility], [place]

## 2024-08-03 ENCOUNTER — Ambulatory Visit: Payer: Self-pay | Admitting: *Deleted

## 2024-08-03 NOTE — Telephone Encounter (Signed)
 Copied from CRM #[id number]. Topic: Clinical - Red Word Triage >> Aug 03, 2024 10:52 AM Dedra B wrote: Red Word that prompted transfer to Nurse Triage: Pt dropped something on her R foot almost 2 weeks ago. Having pain anytime she tries to put pressure on it. Reason for Disposition  [1] MODERATE pain (e.g., interferes with normal activities, limping) AND [2] high-risk adult (e.g., age > 60 years, osteoporosis, chronic steroid use)  Answer Assessment - Initial Assessment Questions 1. MECHANISM: How did the injury happen? (e.g., twisting injury, direct blow)      I dropped something on my right foot 2 weeks ago.   A bin hit below my toes and it hurt but I didn't think anything of it.   It is continuing to hurt.   When I stand up it really hurts.   I'm limping.    In the mornings when I get up it really hurts.   It takes a while for me to be able to put pressure on it.   I'm really limping at first then it gets better.    But it's been 2 weeks and not getting better. 2. ONSET: When did the injury happen? (e.g., minutes or hours ago)      2 weeks 3. LOCATION: Where is the injury located?      Right foot above my toes.  4. APPEARANCE of INJURY: What does the injury look like?      No bruises or redness or swelling 5. WEIGHT-BEARING: Can you put weight on that foot? Can you walk (four steps or more)?       Yes but I'm limping 6. SIZE: For cuts, bruises, or swelling, ask: How large is it? (e.g., inches or centimeters;  entire joint)      None 7. PAIN: Is there pain? If Yes, ask: How bad is the pain? What does it keep you from doing? (Scale 0-10; or none, mild, moderate, severe)     Yes 8. TETANUS: For any breaks in the skin, ask: When was your last tetanus booster?     N/A 9. OTHER SYMPTOMS: Do you have any other symptoms?      No 10. PREGNANCY: Is there any chance you are pregnant? When was your last menstrual period?       Not asked  Protocols used: Foot  Injury-A-AH FYI Only or Action Required?: FYI only for provider.  Patient was last seen in primary care on 04/10/2024 by Edman Marsa PARAS, DO.  Called Nurse Triage reporting Foot Injury.  Symptoms began several weeks ago.2 weeks ago dropped a bin above her toes on right foot.   Interventions attempted: Nothing.  Symptoms are: unchanged. Not getting better.   Still limping around  Triage Disposition: See Physician Within 24 Hours  Patient/caregiver understands and will follow disposition?: YesFYI Only or Action Required?: FYI only for provider.  Patient was last seen in primary care on 04/10/2024 by Edman Marsa PARAS, DO.  Called Nurse Triage reporting Foot Injury.  Symptoms began several weeks ago.  Interventions attempted: Nothing.  Symptoms are: unchanged.  Triage Disposition: See Physician Within 24 Hours  Patient/caregiver understands and will follow disposition?: Yes

## 2024-08-03 NOTE — Telephone Encounter (Signed)
Will discuss at upcoming appt tomorrow °

## 2024-08-04 ENCOUNTER — Ambulatory Visit
Admission: RE | Admit: 2024-08-04 | Discharge: 2024-08-04 | Disposition: A | Discharge status: Home / Self Care | Source: Ambulatory Visit | Attending: Internal Medicine | Admitting: Internal Medicine

## 2024-08-04 ENCOUNTER — Ambulatory Visit: Payer: Self-pay | Admitting: Internal Medicine

## 2024-08-04 ENCOUNTER — Encounter: Payer: Self-pay | Admitting: Internal Medicine

## 2024-08-04 ENCOUNTER — Ambulatory Visit: Admitting: Internal Medicine

## 2024-08-04 VITALS — BP 118/78 | Ht 69.0 in | Wt 167.8 lb

## 2024-08-04 DIAGNOSIS — R2689 Other abnormalities of gait and mobility: Secondary | ICD-10-CM | POA: Diagnosis not present

## 2024-08-04 DIAGNOSIS — M79671 Pain in right foot: Secondary | ICD-10-CM | POA: Diagnosis present

## 2024-08-04 NOTE — Patient Instructions (Signed)
 RICE Therapy for Routine Care of Injuries Many injuries can be cared for with rest, ice, compression, and elevation. This is also called RICE therapy. RICE therapy includes: Resting the injured body part. Putting ice on the injury. Putting pressure on the injury. This is also called compression. Raising the injured part. This is also called elevation. RICE therapy can help reduce pain and swelling. Supplies needed: Ice. Plastic bag. Towel. Elastic bandage. Pillow or pillows to raise the injured body part. How to care for your injury with RICE therapy Rest Try to rest the injured part of your body. You can go back to your normal activities when your health care provider says it's okay to do them and when you can do them without pain. Ask what things are safe for you to do. Some injuries heal better with early movement instead of resting. If you rest the injury too much, it may not heal as well. Ask your provider if you should do exercises to help your injury get better. Ice Putting ice on your injury can help to lessen swelling and pain. Do not apply ice directly to your skin. Use ice on as many days as told by your provider. If told, put ice on the area. Put ice in a plastic bag. Place a towel between your skin and the bag. Leave the ice on for 20 minutes, 2-3 times a day. If your skin turns bright red, take off the ice right away to prevent skin damage. The risk of damage is higher if you can't feel pain, heat, or cold.  Compression Put pressure, also called compression, on your injured area. This can be done with an elastic bandage. If this type of bandage has been put on your injury: Follow instructions on the package the bandage came in about how to use it. Do not wrap the bandage too tightly. Wrap the bandage more loosely if part of your body beyond the bandage looks blue, or is swollen, cold, painful, or loses feeling. Take off the bandage and put it on again every 3-4 hours or  as told by your provider. Call your provider if the bandage seems to make your injury worse.  Elevation Raise the injured area above the level of your heart while you're sitting or lying down. Use a pillow to support your injured area as needed. Follow these instructions at home: If your symptoms get worse or last a long time, make a follow-up appointment with your provider. You may need to have imaging tests, such as X-rays or an MRI. If you have imaging tests, ask how to get your results when they are ready. Contact a health care provider if: You keep having pain and swelling. Your symptoms get worse. Get help right away if: You have sudden, very bad pain at your injury or lower than your injury. You have tingling or numbness at your injury or lower than your injury, and it does not go away when you take the bandage off. This information is not intended to replace advice given to you by your health care provider. Make sure you discuss any questions you have with your health care provider. Document Revised: 08/29/2023 Document Reviewed: 02/11/2023 Elsevier Patient Education  2024 [employer].

## 2024-08-04 NOTE — Progress Notes (Signed)
 Subjective:    Patient ID: Sheryl Carr, female    DOB: 1971-08-03, 53 y.o.   MRN: [id number]  HPI  Discussed the use of AI scribe software for clinical note transcription with the patient, who gave verbal consent to proceed.  Sheryl Carr is a 53 year old female who presents with persistent foot pain following an injury.  Two Saturdays ago, she attempted to move a bin containing wooden materials, which tipped and landed on her foot. Initially, she experienced pain but continued with her activities. The following morning, the pain was significantly worse, and it has persisted since then.  The pain is primarily located across the top of the foot, but on the day of the visit, she also reported pain across the bottom of the foot. The pain worsens after prolonged sitting and improves somewhat with movement, although she experiences a limp when walking. No swelling, bruising, numbness, or tingling has been noted.  She has not taken any medication for the pain. She mentioned a previous hairline fracture in the left foot but is unsure if it is related to the current issue. She has no known history of osteopenia or osteoporosis and has never had a bone density exam.  She was on her feet for an extended period at an orchard the Saturday before the visit, which exacerbated the pain the following day.       Review of Systems   Past Medical History:  Diagnosis Date   Anemia    Chronic kidney disease    H/O KIDNEY STONES   Endometrial polyp 2016   hysteroscopically removed   GERD (gastroesophageal reflux disease)    H/O mammogram 05/11/2015   Hematuria    History of kidney stones    History of uterine fibroid    s/p hysterectomy 08/2017   HTN (hypertension)    PT WAS TAKEN OFF HER BP MED IN MAY/JUNE OF 2016 BUT BP IS ELEVATED AGAIN    Current Outpatient Medications  Medication Sig Dispense Refill   diclofenac  Sodium (VOLTAREN ) 1 % GEL Apply 2 g topically 4 (four)  times daily as needed. 100 g 2   econazole nitrate 1 % cream  (Patient not taking: Reported on 03/18/2024)  2   losartan  (COZAAR ) 25 MG tablet Take 1 tablet (25 mg total) by mouth daily. 90 tablet 3   Multiple Vitamins-Calcium (ONE-A-DAY WOMENS FORMULA PO) Take 1 tablet by mouth daily.      mupirocin  ointment (BACTROBAN ) 2 % Apply 1 Application topically 2 (two) times daily. For 1-2 weeks, then as needed. 22 g 0   No current facility-administered medications for this visit.    Allergies  Allergen Reactions   Pollen Extract     itchy eyes, dull headaches, runny nose   Penicillins Rash    Has patient had a PCN reaction causing immediate rash, facial/tongue/throat swelling, SOB or lightheadedness with hypotension: No Has patient had a PCN reaction causing severe rash involving mucus membranes or skin necrosis: No Has patient had a PCN reaction that required hospitalization: No Has patient had a PCN reaction occurring within the last 10 years: No If all of the above answers are NO, then may proceed with Cephalosporin use.     Family History  Problem Relation Age of Onset   Diabetes Mellitus I Mother    Diabetes Mellitus I Maternal Grandmother    Prostate cancer Maternal Grandfather    Diabetes Mellitus I Paternal Grandmother    Breast cancer Neg Hx  Social History   Socioeconomic History   Marital status: Married    Spouse name: Cathlyn   Number of children: 2   Years of education: college   Highest education level: Not on file  Occupational History   Occupation: n/a    Employer: [employer]  Tobacco Use   Smoking status: Former    Current packs/day: 0.00    Types: Cigarettes    Quit date: 09/24/1990    Years since quitting: 33.8   Smokeless tobacco: Former  Building services engineer status: Never Used  Substance and Sexual Activity   Alcohol use: Yes    Alcohol/week: 2.0 - 3.0 standard drinks of alcohol    Types: 2 - 3 Standard drinks or equivalent per week     Comment: social   Drug use: No   Sexual activity: Yes    Birth control/protection: None  Other Topics Concern   Not on file  Social History Narrative   ** Merged History Encounter **       Patient is married Therapist, art) and lives at home with her husband and one child, one child is in college.   Patient is working full-time.   Patient has a college education.   Patient is right-handed.   Patient drinks 1-2 cups of coffee daily, soda and tea are occasionally, not daily.   Social Drivers of Corporate investment banker Strain: Not on file  Food Insecurity: Not on file  Transportation Needs: Not on file  Physical Activity: Not on file  Stress: Not on file  Social Connections: Not on file  Intimate Partner Violence: Not on file     Constitutional: Denies fever, malaise, fatigue, headache or abrupt weight changes.  Respiratory: Denies difficulty breathing, shortness of breath, cough or sputum production.   Cardiovascular: Denies chest pain, chest tightness, palpitations or swelling in the hands or feet.  Musculoskeletal: Patient reports right foot pain, difficulty with gait.  Denies decrease in range of motion, muscle pain or joint swelling.  Skin: Denies redness, rashes, lesions or ulcercations.  Neurological: Denies dizziness, difficulty with memory, difficulty with speech or problems with balance and coordination.    No other specific complaints in a complete review of systems (except as listed in HPI above).      Objective:   Physical Exam  BP 118/78 (BP Location: Right Arm, Patient Position: Sitting, Cuff Size: Normal)   Ht 5' 9 (1.753 m)   Wt 167 lb 12.8 oz (76.1 kg)   LMP 08/09/2017 (Exact Date)   BMI 24.78 kg/m   Wt Readings from Last 3 Encounters:  04/10/24 167 lb 6 oz (75.9 kg)  03/18/24 167 lb (75.8 kg)  12/25/23 168 lb (76.2 kg)    General: Appears her stated age, well developed, well nourished in NAD. Skin: Warm, dry and intact. No bruising or abrasions  noted to the right foot. Cardiovascular: Normal rate.  Pedal pulse 2+ on the right. Musculoskeletal: Normal flexion, extension and rotation of the right ankle. No signs of joint swelling.  Pain with palpation over the mid to distal 2nd and 3rd metatarsals.  Able to stand on tiptoes and heels.  Slightly limping gait.  Neurological: Alert and oriented.  Sensation intact to RLE.  BMET    Component Value Date/Time   NA 139 12/18/2023 0842   NA 140 08/08/2015 1604   NA 139 09/07/2013 2310   K 4.5 12/18/2023 0842   K 4.1 09/07/2013 2310   CL 103 12/18/2023 9157  CL 107 09/07/2013 2310   CO2 26 12/18/2023 0842   CO2 26 09/07/2013 2310   GLUCOSE 99 12/18/2023 0842   GLUCOSE 95 09/07/2013 2310   BUN 22 12/18/2023 0842   BUN 19 09/08/2018 0910   BUN 25 (H) 09/07/2013 2310   CREATININE 0.79 12/18/2023 0842   CALCIUM 9.4 12/18/2023 0842   CALCIUM 9.1 09/07/2013 2310   GFRNONAA 89 10/19/2020 0803   GFRAA 103 10/19/2020 0803    Lipid Panel     Component Value Date/Time   CHOL 205 (H) 12/18/2023 0842   CHOL 185 03/05/2018 1023   TRIG 85 12/18/2023 0842   HDL 63 12/18/2023 0842   HDL 56 03/05/2018 1023   CHOLHDL 3.3 12/18/2023 0842   VLDL 11 10/22/2016 0001   LDLCALC 124 (H) 12/18/2023 0842    CBC    Component Value Date/Time   WBC 5.5 12/18/2023 0842   RBC 4.63 12/18/2023 0842   HGB 13.4 12/18/2023 0842   HGB 8.3 (L) 08/08/2015 1604   HCT 41.2 12/18/2023 0842   HCT 27.3 (L) 08/08/2015 1604   PLT 257 12/18/2023 0842   PLT 375 08/08/2015 1604   MCV 89.0 12/18/2023 0842   MCV 68 (L) 08/08/2015 1604   MCV 84 09/07/2013 2310   MCH 28.9 12/18/2023 0842   MCHC 32.5 12/18/2023 0842   RDW 11.9 12/18/2023 0842   RDW 16.4 (H) 08/08/2015 1604   RDW 13.2 09/07/2013 2310   LYMPHSABS 1,085 12/12/2022 0908   LYMPHSABS 1.4 08/08/2015 1604   MONOABS 468 10/22/2016 0001   EOSABS 138 12/18/2023 0842   EOSABS 0.1 08/08/2015 1604   BASOSABS 50 12/18/2023 0842   BASOSABS 0.0 08/08/2015  1604    Hgb A1C Lab Results  Component Value Date   HGBA1C 5.1 12/18/2023            Assessment & Plan:  Assessment and Plan    Right foot pain Pain persists for two weeks post-trauma. Differential includes metatarsal fracture or bone bruise. No significant swelling or bruising. Full ankle range of motion. - Order stat x-ray of right foot to assess for fracture or bone bruise. - Advise ibuprofen  for pain management, kidney function normal. - Recommend foot elevation to alleviate pain. - Will follow-up after x-ray results with further recommendation and treatment plan     Follow-up with your PCP as previously scheduled Angeline Laura, NP

## 2024-09-12 ENCOUNTER — Observation Stay
Admission: EM | Admit: 2024-09-12 | Discharge: 2024-09-13 | Disposition: A | Discharge status: Home / Self Care | Attending: Internal Medicine | Admitting: Internal Medicine

## 2024-09-12 ENCOUNTER — Emergency Department

## 2024-09-12 DIAGNOSIS — R29818 Other symptoms and signs involving the nervous system: Principal | ICD-10-CM | POA: Insufficient documentation

## 2024-09-12 DIAGNOSIS — Z79899 Other long term (current) drug therapy: Secondary | ICD-10-CM | POA: Diagnosis not present

## 2024-09-12 DIAGNOSIS — N179 Acute kidney failure, unspecified: Secondary | ICD-10-CM | POA: Diagnosis present

## 2024-09-12 DIAGNOSIS — Z7982 Long term (current) use of aspirin: Secondary | ICD-10-CM | POA: Insufficient documentation

## 2024-09-12 DIAGNOSIS — N182 Chronic kidney disease, stage 2 (mild): Secondary | ICD-10-CM | POA: Diagnosis not present

## 2024-09-12 DIAGNOSIS — Z87891 Personal history of nicotine dependence: Secondary | ICD-10-CM | POA: Insufficient documentation

## 2024-09-12 DIAGNOSIS — R2990 Unspecified symptoms and signs involving the nervous system: Secondary | ICD-10-CM

## 2024-09-12 DIAGNOSIS — R42 Dizziness and giddiness: Principal | ICD-10-CM

## 2024-09-12 DIAGNOSIS — E663 Overweight: Secondary | ICD-10-CM | POA: Diagnosis not present

## 2024-09-12 DIAGNOSIS — I10 Essential (primary) hypertension: Secondary | ICD-10-CM | POA: Diagnosis not present

## 2024-09-12 DIAGNOSIS — I129 Hypertensive chronic kidney disease with stage 1 through stage 4 chronic kidney disease, or unspecified chronic kidney disease: Secondary | ICD-10-CM | POA: Diagnosis not present

## 2024-09-12 LAB — CBC
HCT: 38.3 % (ref 36.0–46.0)
Hemoglobin: 13.1 g/dL (ref 12.0–15.0)
MCH: 29 pg (ref 26.0–34.0)
MCHC: 34.2 g/dL (ref 30.0–36.0)
MCV: 84.9 fL (ref 80.0–100.0)
Platelets: 271 K/uL (ref 150–400)
RBC: 4.51 MIL/uL (ref 3.87–5.11)
RDW: 11.9 % (ref 11.5–15.5)
WBC: 9.1 K/uL (ref 4.0–10.5)
nRBC: 0 % (ref 0.0–0.2)

## 2024-09-12 LAB — COMPREHENSIVE METABOLIC PANEL WITH GFR
ALT: 35 U/L (ref 0–44)
AST: 26 U/L (ref 15–41)
Albumin: 4 g/dL (ref 3.5–5.0)
Alkaline Phosphatase: 66 U/L (ref 38–126)
Anion gap: 11 (ref 5–15)
BUN: 18 mg/dL (ref 6–20)
CO2: 24 mmol/L (ref 22–32)
Calcium: 9.3 mg/dL (ref 8.9–10.3)
Chloride: 103 mmol/L (ref 98–111)
Creatinine, Ser: 1.36 mg/dL — ABNORMAL HIGH (ref 0.44–1.00)
GFR, Estimated: 47 mL/min — ABNORMAL LOW (ref >60)
Glucose, Bld: 113 mg/dL — ABNORMAL HIGH (ref 70–99)
Potassium: 3.5 mmol/L (ref 3.5–5.1)
Sodium: 138 mmol/L (ref 135–145)
Total Bilirubin: 0.8 mg/dL (ref 0.0–1.2)
Total Protein: 7.1 g/dL (ref 6.5–8.1)

## 2024-09-12 LAB — URINE DRUG SCREEN, QUALITATIVE (ARMC ONLY)
Amphetamines, Ur Screen: NOT DETECTED
Barbiturates, Ur Screen: NOT DETECTED
Benzodiazepine, Ur Scrn: NOT DETECTED
Cannabinoid 50 Ng, Ur ~~LOC~~: NOT DETECTED
Cocaine Metabolite,Ur ~~LOC~~: NOT DETECTED
MDMA (Ecstasy)Ur Screen: NOT DETECTED
Methadone Scn, Ur: NOT DETECTED
Opiate, Ur Screen: NOT DETECTED
Phencyclidine (PCP) Ur S: NOT DETECTED
Tricyclic, Ur Screen: NOT DETECTED

## 2024-09-12 LAB — DIFFERENTIAL
Abs Immature Granulocytes: 0.04 K/uL (ref 0.00–0.07)
Basophils Absolute: 0.1 K/uL (ref 0.0–0.1)
Basophils Relative: 1 %
Eosinophils Absolute: 0.1 K/uL (ref 0.0–0.5)
Eosinophils Relative: 1 %
Immature Granulocytes: 0 %
Lymphocytes Relative: 21 %
Lymphs Abs: 1.9 K/uL (ref 0.7–4.0)
Monocytes Absolute: 0.7 K/uL (ref 0.1–1.0)
Monocytes Relative: 8 %
Neutro Abs: 6.3 K/uL (ref 1.7–7.7)
Neutrophils Relative %: 69 %

## 2024-09-12 LAB — PROTIME-INR
INR: 1 (ref 0.8–1.2)
Prothrombin Time: 13.3 s (ref 11.4–15.2)

## 2024-09-12 LAB — APTT: aPTT: 31 s (ref 24–36)

## 2024-09-12 LAB — ETHANOL: Alcohol, Ethyl (B): <15 mg/dL (ref <15)

## 2024-09-12 MED ORDER — HYDRALAZINE HCL 20 MG/ML IJ SOLN: 5.0000 mg | INTRAMUSCULAR | Status: DC | PRN

## 2024-09-12 MED ORDER — ATORVASTATIN CALCIUM 20 MG PO TABS
40.0000 mg | ORAL_TABLET | Freq: Every day | ORAL | Status: DC
  Administered 2024-09-13: 40 mg via ORAL
  Filled 2024-09-12: qty 2

## 2024-09-12 MED ORDER — ONDANSETRON HCL 4 MG/2ML IJ SOLN: 4.0000 mg | Freq: Three times a day (TID) | INTRAMUSCULAR | Status: DC | PRN

## 2024-09-12 NOTE — Consult Note (Signed)
 [facility]urology Consult Services   Patient Name:   Sheryl Carr, Nop Date of Birth:   [birth year] Identification Number:   MRN - [id number] Date of Service:   09/12/2024 22:37:27  Diagnosis:       R53.1 - Weakness  Impression:      Pt is 53 yo F with hx of HTN initially presenting with lightheadedness. Stroke code for additional finding of left leg drift.    Stat ct head with no acute finding. No TNK given mild non disabling deficit. Also unclear exactly the symptom began: decision discussed with the pt, who agrees to no TNK    Recommend further stroke/tia work up including MRI head, MRA head & neck, echo with bubble study, tele monitoring, lipid panel    aspirin 81mg  daily for now    Case discussed with Dr Viviann  Our recommendations are outlined below.  Recommendations:        Stroke/Telemetry Floor       Neuro Checks (Q4)       Bedside Swallow Eval       DVT Prophylaxis       IV Fluids, Normal Saline       Head of Bed 30 Degrees       Euglycemia and Avoid Hyperthermia (PRN Acetaminophen )       Initiate or continue Aspirin 81 MG daily       Antihypertensives PRN if Blood pressure is greater than 220/120 or there is a concern for End organ damage/contraindications for permissive HTN. If blood pressure is greater than 220/120 give labetalol PO or IV or Vasotec IV with a goal of 15% reduction in BP during the first 24 hours.  Sign Out:       Discussed with Emergency Department Provider    ------------------------------------------------------------------------------  Advanced Imaging: Advanced Imaging Deferred because:  Non-disabling symptoms as verified by the patient; no cortical signs so not consistent with LVO   Metrics: Last Known Well: 09/12/2024 22:00:00 Dispatch Time: 09/12/2024 22:37:27 Arrival Time: 09/12/2024 21:57:00 Initial Response Time: 09/12/2024 22:46:36 Symptoms: lightheadedness. Initial patient interaction:  09/12/2024 22:47:27 NIHSS Assessment Completed: 09/12/2024 23:07:15 Patient is not a candidate for Thrombolytic. Thrombolytic Medical Decision: 09/12/2024 23:07:17 Patient was not deemed candidate for Thrombolytic because of following reasons: Stroke severity too mild (non-disabling) .  CT Head: I personally reviewed all the CT images that were available to me and it showed: no acute finding, no bleed  Primary Provider Notified of Diagnostic Impression and Management Plan on: 09/12/2024 23:16:43    ------------------------------------------------------------------------------  History of Present Illness: Patient is a 53 year old Female.  Patient was brought by private transportation with symptoms of lightheadedness. Pt is 53 yo F with hx of HTN initially presenting with lightheadedness. Stroke code for additional finding of left leg drift.  Pt reports onset of fatigue and lightheadedness sensation around 3pm followed by chest discomfort around 7pm, prompting the ER visit.  On arrival, pt noted with very subtle left leg drift. Pt denies any gait instability, weakness today.  otherwise, pt is awake and alert, following simple commands.      Past Medical History:      Hypertension  Medications:  No Anticoagulant use  No Antiplatelet use Reviewed EMR for current medications  Allergies:  Reviewed  Social History: Smoking: No Alcohol Use: No  Family History:  There is no family history of premature cerebrovascular disease pertinent to this consultation  ROS : 14 Points Review of Systems was performed and was negative except mentioned  in HPI.  Past Surgical History: There Is No Surgical History Contributory To Today's Visit     Examination: BP(182/99), Pulse(74), 1A: Level of Consciousness - Alert; keenly responsive + 0 1B: Ask Month and Age - Both Questions Right + 0 1C: Blink Eyes & Squeeze Hands - Performs Both Tasks + 0 2: Test Horizontal Extraocular  Movements - Normal + 0 3: Test Visual Fields - No Visual Loss + 0 4: Test Facial Palsy (Use Grimace if Obtunded) - Normal symmetry + 0 5A: Test Left Arm Motor Drift - No Drift for 10 Seconds + 0 5B: Test Right Arm Motor Drift - No Drift for 10 Seconds + 0 6A: Test Left Leg Motor Drift - Drift, but doesn't hit bed + 1 6B: Test Right Leg Motor Drift - No Drift for 5 Seconds + 0 7: Test Limb Ataxia (FNF/Heel-Shin) - No Ataxia + 0 8: Test Sensation - Normal; No sensory loss + 0 9: Test Language/Aphasia - Normal; No aphasia + 0 10: Test Dysarthria - Normal + 0 11: Test Extinction/Inattention - No abnormality + 0  NIHSS Score: 1   Pre-Morbid Modified Rankin Scale: 0 Points = No symptoms at all  Spoke with : Dr Viviann  This consult was conducted in real time using interactive audio and Immunologist. Patient was informed of the technology being used for this visit and agreed to proceed. Patient located in hospital and provider located at home/office setting.   Patient is being evaluated for possible acute neurologic impairment and high probability of imminent or life-threatening deterioration. I spent total of 55 minutes providing care to this patient, including time for face to face visit via telemedicine, review of medical records, imaging studies and discussion of findings with providers, the patient and/or family.    Dr Twyla Grew   [facility] For Inpatient follow-up with [facility] physician please call [facility] at [phone]. As we are not an outpatient service for any post hospital discharge needs please contact the hospital for assistance. If you have any questions for the [facility] physicians or need to reconsult for clinical or diagnostic changes please contact us  via [facility] at [phone].   Signature : [place] Canty

## 2024-09-12 NOTE — ED Notes (Signed)
Pt transported to CT 2 

## 2024-09-12 NOTE — ED Notes (Signed)
 2 green tops, 1 lavender and 1 red top sent to lab with temporary Sunquest label.

## 2024-09-12 NOTE — H&P (Signed)
 History and Physical    Sheryl Carr FMW:[id number] DOB: January 06, 1971 DOA: 09/12/2024  Referring MD/NP/PA:   PCP: Edman Marsa PARAS, DO   Patient coming from:  The patient is coming from home.     Chief Complaint: dizziness, poor balance, difficulty speaking  HPI: Sheryl Carr is a 53 y.o. female with medical history significant of HTN, GERD, anxiety, CKD-2, kidney stone, uterine fibroid, who presents with dizziness, poor balance, difficulty speaking.  Patient states that she has been feeling fatigued for the whole day.  At about 3 PM, she developed lightheadedness, dizziness with poor balance.  She checked her blood pressure which was 218/180 at home.  Her blood pressure is 182/99 in ED, which improved to 128/81 without treatment. Pt denies unilateral numbness or tingling in extremities.  No facial droop or vision loss.  Per her husband, patient had 2 episodes of difficulty speaking in ED.  Patient states that she had mild chest discomfort earlier which has resolved.  No SOB, cough, fever or chills.  She has some nausea, no vomiting, diarrhea or abdominal pain.  No symptoms of UTI.  Data reviewed independently and ED Course: pt was found to have WBC 9.1, worsening renal function, alcohol level less than 15.  Temperature normal, heart rate 86, RR 15, oxygen saturation 97% on room air.  CT of head negative for acute intracranial abnormalities.  Teleneurology, Dr. Todo is consulted.   EKG: I have personally reviewed.  Sinus rhythm, QTc 444, no ischemic change.   Review of Systems:   General: no fevers, chills, no body weight gain, has fatigue HEENT: no blurry vision, hearing changes or sore throat Respiratory: no dyspnea, coughing, wheezing CV: no chest pain, no palpitations GI: has nausea, no vomiting, abdominal pain, diarrhea, constipation GU: no dysuria, burning on urination, increased urinary frequency, hematuria  Ext: no leg edema Neuro: no unilateral weakness,  numbness, or tingling, no vision change or hearing loss. Has dizziness, poor balance, difficulty speaking Skin: no rash, no skin tear. MSK: No muscle spasm, no deformity, no limitation of range of movement in spin Heme: No easy bruising.  Travel history: No recent long distant travel.   Allergy:  Allergies  Allergen Reactions   Pollen Extract     itchy eyes, dull headaches, runny nose   Amoxicillin Rash    Past Medical History:  Diagnosis Date   Anemia    Chronic kidney disease    H/O KIDNEY STONES   Endometrial polyp 2016   hysteroscopically removed   GERD (gastroesophageal reflux disease)    H/O mammogram 05/11/2015   Hematuria    History of kidney stones    History of uterine fibroid    s/p hysterectomy 08/2017   HTN (hypertension)    PT WAS TAKEN OFF HER BP MED IN MAY/JUNE OF 2016 BUT BP IS ELEVATED AGAIN    Past Surgical History:  Procedure Laterality Date   ABDOMINAL HYSTERECTOMY     BREAST CYST ASPIRATION Right 12/24/2019   CESAREAN SECTION     COLONOSCOPY WITH PROPOFOL  N/A 11/02/2019   Procedure: COLONOSCOPY WITH PROPOFOL ;  Surgeon: Janalyn Keene NOVAK, MD;  Location: [facility];  Service: Endoscopy;  Laterality: N/A;   HYSTERECTOMY ABDOMINAL WITH SALPINGECTOMY Bilateral 08/19/2017   Procedure: HYSTERECTOMY ABDOMINAL WITH BILATERAL SALPINGECTOMY;  Surgeon: Connell Davies, MD;  Location: [facility] ORS;  Service: Gynecology;  Laterality: Bilateral;   HYSTEROSCOPY WITH D & C N/A 11/14/2015   Procedure: DILATATION AND CURETTAGE /HYSTEROSCOPY;  Surgeon: Davies Connell, MD;  Location:  [facility] ORS;  Service: Gynecology;  Laterality: N/A;   KIDNEY STONE SURGERY      Social History:  reports that she quit smoking about 33 years ago. Her smoking use included cigarettes. She has quit using smokeless tobacco. She reports current alcohol use of about 2.0 - 3.0 standard drinks of alcohol per week. She reports that she does not use drugs.  Family History:  Family History  Problem  Relation Age of Onset   Diabetes Mellitus I Mother    Diabetes Mellitus I Maternal Grandmother    Prostate cancer Maternal Grandfather    Diabetes Mellitus I Paternal Grandmother    Breast cancer Neg Hx      Prior to Admission medications   Medication Sig Start Date End Date Taking? Authorizing Provider  diclofenac  Sodium (VOLTAREN ) 1 % GEL Apply 2 g topically 4 (four) times daily as needed. 03/01/21   Edman, Marsa PARAS, DO  econazole nitrate 1 % cream  01/15/17   [provider]  losartan  (COZAAR ) 25 MG tablet Take 1 tablet (25 mg total) by mouth daily. 12/25/23   Karamalegos, Marsa PARAS, DO  Multiple Vitamins-Calcium (ONE-A-DAY WOMENS FORMULA PO) Take 1 tablet by mouth daily.     [provider]  mupirocin  ointment (BACTROBAN ) 2 % Apply 1 Application topically 2 (two) times daily. For 1-2 weeks, then as needed. Patient not taking: Reported on 08/04/2024 03/18/24   Edman Marsa PARAS, DO    Physical Exam: Vitals:   09/12/24 2230 09/12/24 2300 09/12/24 2330 09/13/24 0118  BP: (!) 162/98 (!) 149/92 128/81 (!) 144/84  Pulse: 75 77 67 70  Resp: 14 15 13 18   Temp:    97.7 F (36.5 C)  TempSrc:      SpO2: 99% 97% 97% 97%  Weight:      Height:       General: Not in acute distress HEENT:       Eyes: PERRL, EOMI, no jaundice       ENT: No discharge from the ears and nose, no pharynx injection, no tonsillar enlargement.        Neck: No JVD, no bruit, no mass felt. Heme: No neck lymph node enlargement. Cardiac: S1/S2, RRR, No murmurs, No gallops or rubs. Respiratory: No rales, wheezing, rhonchi or rubs. GI: Soft, nondistended, nontender, no rebound pain, no organomegaly, BS present. GU: No hematuria Ext: No pitting leg edema bilaterally. 1+DP/PT pulse bilaterally. Musculoskeletal: No joint deformities, No joint redness or warmth, no limitation of ROM in spin. Skin: No rashes.  Neuro: Alert, oriented X3, cranial nerves II-XII grossly intact, moves all  extremities normally. Muscle strength 5/5 in all extremities, sensation to light touch intact.  Psych: Patient is not psychotic, no suicidal or hemocidal ideation.  Labs on Admission: I have personally reviewed following labs and imaging studies  CBC: Recent Labs  Lab 09/12/24 2200  WBC 9.1  NEUTROABS 6.3  HGB 13.1  HCT 38.3  MCV 84.9  PLT 271   Basic Metabolic Panel: Recent Labs  Lab 09/12/24 2200  NA 138  K 3.5  CL 103  CO2 24  GLUCOSE 113*  BUN 18  CREATININE 1.36*  CALCIUM 9.3   GFR: Estimated Creatinine Clearance: 48.8 mL/min (A) (by C-G formula based on SCr of 1.36 mg/dL (H)). Liver Function Tests: Recent Labs  Lab 09/12/24 2200  AST 26  ALT 35  ALKPHOS 66  BILITOT 0.8  PROT 7.1  ALBUMIN 4.0   No results for input(s): LIPASE, AMYLASE in the  last 168 hours. No results for input(s): AMMONIA in the last 168 hours. Coagulation Profile: Recent Labs  Lab 09/12/24 2306  INR 1.0   Cardiac Enzymes: No results for input(s): CKTOTAL, CKMB, CKMBINDEX, TROPONINI in the last 168 hours. BNP (last 3 results) No results for input(s): PROBNP in the last 8760 hours. HbA1C: No results for input(s): HGBA1C in the last 72 hours. CBG: No results for input(s): GLUCAP in the last 168 hours. Lipid Profile: No results for input(s): CHOL, HDL, LDLCALC, TRIG, CHOLHDL, LDLDIRECT in the last 72 hours. Thyroid  Function Tests: No results for input(s): TSH, T4TOTAL, FREET4, T3FREE, THYROIDAB in the last 72 hours. Anemia Panel: No results for input(s): VITAMINB12, FOLATE, FERRITIN, TIBC, IRON, RETICCTPCT in the last 72 hours. Urine analysis:    Component Value Date/Time   COLORURINE YELLOW 04/11/2022 0837   APPEARANCEUR Cloudy (A) 04/24/2022 0822   LABSPEC 1.016 04/11/2022 0837   PHURINE 6.0 04/11/2022 0837   GLUCOSEU Negative 04/24/2022 0822   HGBUR 3+ (A) 04/11/2022 0837   BILIRUBINUR Negative 07/11/2022 1519    BILIRUBINUR Negative 04/24/2022 0822   KETONESUR NEGATIVE 04/11/2022 0837   PROTEINUR Positive (A) 07/11/2022 1519   PROTEINUR Negative 04/24/2022 0822   PROTEINUR TRACE (A) 04/11/2022 0837   UROBILINOGEN 0.2 07/11/2022 1519   NITRITE Negative 07/11/2022 1519   NITRITE Negative 04/24/2022 0822   NITRITE NEGATIVE 04/11/2022 0837   LEUKOCYTESUR Negative 07/11/2022 1519   LEUKOCYTESUR Negative 04/24/2022 0822   LEUKOCYTESUR 3+ (A) 04/11/2022 0837   Sepsis Labs: @LABRCNTIP (procalcitonin:4,lacticidven:4) )No results found for this or any previous visit (from the past 240 hours).   Radiological Exams on Admission:   Assessment/Plan Principal Problem:   Stroke-like symptom Active Problems:   Essential hypertension   Acute renal failure superimposed on stage 2 chronic kidney disease   Overweight (BMI 25.0-29.9)   Assessment and Plan:  Stroke-like symptom: Etiology is not clear.  CT head negative.  Teleneurology, Dr. Mort was consulted, who recommended stroke workup, including MRI of brain, MRA of head and neck, 2D echo with bubble.  -Placed on tele bed for observation - Obtain MRI-brain, MRA of head and neck - will hold oral Bp meds to allow permissive HTN - start ASA  - Statin: lipitor 40 mg dailyu - fasting lipid panel and HbA1c  - 2D echo with bubble - swallowing screen. If fails, will get SLP - Check UDS, ANA, ESR and CRP - PT/OT consult  Essential hypertension - prn IV hydralazine for SBP>220 or dBP>110 - Hold Cozaar   Acute renal failure superimposed on stage 2 chronic kidney disease: Baseline creatinine is 0.79 12/18/2023.  Her creatinine is 1.36, BUN 18, GFR 47 today. -Hold Cozaar  - IV fluid: Normal saline at 75 cc/h  Overweight (BMI 25.0-29.9): Body weight 75.8 kg, BMI 25.39 -Encourage losing weight -exercise healthy diet    DVT ppx:  SQ Lovenox  Code Status: Full code   Family Communication:   Yes, patient's husband and daughter at bed side.       Disposition Plan:  Anticipate discharge back to previous environment  Consults called: Teleneurology, Dr. Mort  Admission status and Level of care: Telemetry Medical:    for obs        Dispo: The patient is from: Home              Anticipated d/c is to: Home              Anticipated d/c date is: 1 day  Patient currently is not medically stable to d/c.    Severity of Illness:  The appropriate patient status for this patient is OBSERVATION. Observation status is judged to be reasonable and necessary in order to provide the required intensity of service to ensure the patient's safety. The patient's presenting symptoms, physical exam findings, and initial radiographic and laboratory data in the context of their medical condition is felt to place them at decreased risk for further clinical deterioration. Furthermore, it is anticipated that the patient will be medically stable for discharge from the hospital within 2 midnights of admission.        Date of Service 09/13/2024    Caleb Exon [facility]   If 7PM-7AM, please contact night-coverage [email or web address] 09/13/2024, 1:19 AM

## 2024-09-12 NOTE — ED Notes (Signed)
 Call lab and ask to run the blood work previously sent down, states they ware in the process

## 2024-09-12 NOTE — ED Notes (Signed)
 This tech did intentional rounding. Pt is resting comfortably with no complaints at this time.

## 2024-09-12 NOTE — ED Triage Notes (Signed)
 Patient presents to the ED via [facility] for complaints of feeling light headed and dizziness. Reports taking Losartan  and has been taking it as prescribed. AAO&4, VSS, BP currently 182/99. Reports taking her Losartan  in the morning and says she took it today.

## 2024-09-12 NOTE — ED Notes (Signed)
@   2227 Spoke with Ginnie from [place] to activate code Stroke

## 2024-09-12 NOTE — ED Provider Notes (Signed)
 [facility] Provider Note    Event Date/Time   First MD Initiated Contact with Patient 09/12/24 2215     (approximate)   History   Chief Complaint: Hypertension, Dizziness, and Nausea   HPI  Sheryl Carr is a 53 y.o. female with a history of hypertension, CKD who comes ED complaining of of lightheadedness, fogginess, and feeling off balance that started today.  She reports initially feeling fatigued around 3:00 PM, which then evolved to additional symptoms around 7:00 PM.  Denies headache vision change chest pain shortness of breath fever.  No recent illness or trauma  She has been compliant with her medication including losartan  for hypertension.  She has been on her current dose for several years.      Past Medical History:  Diagnosis Date   Anemia    Chronic kidney disease    H/O KIDNEY STONES   Endometrial polyp 2016   hysteroscopically removed   GERD (gastroesophageal reflux disease)    H/O mammogram 05/11/2015   Hematuria    History of kidney stones    History of uterine fibroid    s/p hysterectomy 08/2017   HTN (hypertension)    PT WAS TAKEN OFF HER BP MED IN MAY/JUNE OF 2016 BUT BP IS ELEVATED AGAIN    Current Outpatient Rx   Order #: [id number] Class: OTC   Order #: [id number] Class: Historical Med   Order #: [id number] Class: Normal   Order #: [id number] Class: Historical Med   Order #: [id number] Class: Normal    Past Surgical History:  Procedure Laterality Date   ABDOMINAL HYSTERECTOMY     BREAST CYST ASPIRATION Right 12/24/2019   CESAREAN SECTION     COLONOSCOPY WITH PROPOFOL  N/A 11/02/2019   Procedure: COLONOSCOPY WITH PROPOFOL ;  Surgeon: Janalyn Keene NOVAK, MD;  Location: [facility];  Service: Endoscopy;  Laterality: N/A;   HYSTERECTOMY ABDOMINAL WITH SALPINGECTOMY Bilateral 08/19/2017   Procedure: HYSTERECTOMY ABDOMINAL WITH BILATERAL SALPINGECTOMY;  Surgeon: Connell Davies, MD;  Location: [facility] ORS;  Service:  Gynecology;  Laterality: Bilateral;   HYSTEROSCOPY WITH D & C N/A 11/14/2015   Procedure: DILATATION AND CURETTAGE /HYSTEROSCOPY;  Surgeon: Davies Connell, MD;  Location: [facility] ORS;  Service: Gynecology;  Laterality: N/A;   KIDNEY STONE SURGERY      Physical Exam   Triage Vital Signs: ED Triage Vitals  Encounter Vitals Group     BP 09/12/24 2207 (!) 182/99     Girls Systolic BP Percentile --      Girls Diastolic BP Percentile --      Boys Systolic BP Percentile --      Boys Diastolic BP Percentile --      Pulse Rate 09/12/24 2207 86     Resp 09/12/24 2207 18     Temp 09/12/24 2207 98.5 F (36.9 C)     Temp Source 09/12/24 2207 Oral     SpO2 09/12/24 2207 99 %     Weight 09/12/24 2208 167 lb (75.8 kg)     Height 09/12/24 2208 5' 8 (1.727 m)     Head Circumference --      Peak Flow --      Pain Score 09/12/24 2208 0     Pain Loc --      Pain Education --      Exclude from Growth Chart --     Most recent vital signs: Vitals:   09/12/24 2230 09/12/24 2300  BP: (!) 162/98 (!) 149/92  Pulse: 75 77  Resp: 14  15  Temp:    SpO2: 99% 97%    General: Awake, no distress.  CV:  Good peripheral perfusion.  Regular rate rhythm Resp:  Normal effort.  Clear lungs Abd:  No distention.  Soft nontender Other:  Cranial nerves II through XII intact.  Very subtle left upper extremity ataxia with finger-to-nose.  Subtle left leg drift versus the right.  Subjective sensory change in the left leg compared to the right.  No neglect.  No aphasia.  Visual fields intact.   ED Results / Procedures / Treatments   Labs (all labs ordered are listed, but only abnormal results are displayed) Labs Reviewed  COMPREHENSIVE METABOLIC PANEL WITH GFR - Abnormal; Notable for the following components:      Result Value   Glucose, Bld 113 (*)    Creatinine, Ser 1.36 (*)    GFR, Estimated 47 (*)    All other components within normal limits  CBC  DIFFERENTIAL  PROTIME-INR  APTT  URINE DRUG SCREEN,  QUALITATIVE ([facility] ONLY)  ETHANOL     EKG Interpreted by me Sinus rhythm rate of 85.  Normal axis intervals QRS ST segments and T waves   RADIOLOGY CT head negative for acute findings.  Radiology report reviewed   PROCEDURES:  Procedures   MEDICATIONS ORDERED IN ED: Medications - No data to display   IMPRESSION / MDM / ASSESSMENT AND PLAN / ED COURSE  I reviewed the triage vital signs and the nursing notes.  DDx: Acute ischemic stroke, symptomatic hypertension, electrolyte derangement, anemia, dehydration, viral illness  Patient's presentation is most consistent with acute presentation with potential threat to life or bodily function.     Clinical Course as of 09/12/24 2320  Sat Sep 12, 2024  2227 Presents with dizziness, feeling off balance.  Exam reveals slight weakness and altered sensation of the left lower extremity and some mild left upper extremity limb ataxia, concerning for stroke with last known well 7 PM.  Will initiate code stroke protocol. [PS]  2251 CTH neg. D/w radiology [PS]  2316 LKW at 3p outside of window for TNK. Slightly off balance with subtle decreased sensation to her leg and arm ataxia. Discussed with stroke neurology - recommended admission for cva work up. Labs and vitals reassuring.  [SM]    Clinical Course User Index [PS] Viviann Pastor, MD [SM] Suzanne Kirsch, MD     FINAL CLINICAL IMPRESSION(S) / ED DIAGNOSES   Final diagnoses:  Dizziness     Rx / DC Orders   ED Discharge Orders     None        Note:  This document was prepared using Dragon voice recognition software and may include unintentional dictation errors.   Viviann Pastor, MD 09/12/24 [phone]

## 2024-09-13 ENCOUNTER — Encounter: Payer: Self-pay | Admitting: Internal Medicine

## 2024-09-13 ENCOUNTER — Other Ambulatory Visit: Payer: Self-pay

## 2024-09-13 ENCOUNTER — Observation Stay

## 2024-09-13 ENCOUNTER — Observation Stay
Admit: 2024-09-13 | Discharge: 2024-09-13 | Disposition: A | Discharge status: Home / Self Care | Attending: Internal Medicine | Admitting: Internal Medicine

## 2024-09-13 DIAGNOSIS — I161 Hypertensive emergency: Secondary | ICD-10-CM | POA: Diagnosis not present

## 2024-09-13 DIAGNOSIS — G459 Transient cerebral ischemic attack, unspecified: Secondary | ICD-10-CM | POA: Diagnosis not present

## 2024-09-13 DIAGNOSIS — I10 Essential (primary) hypertension: Secondary | ICD-10-CM | POA: Diagnosis not present

## 2024-09-13 DIAGNOSIS — R2990 Unspecified symptoms and signs involving the nervous system: Secondary | ICD-10-CM | POA: Diagnosis present

## 2024-09-13 LAB — LIPID PANEL
Cholesterol: 208 mg/dL — ABNORMAL HIGH (ref 0–200)
HDL: 61 mg/dL (ref >40)
LDL Cholesterol: 111 mg/dL — ABNORMAL HIGH (ref 0–99)
Total CHOL/HDL Ratio: 3.4 ratio
Triglycerides: 179 mg/dL — ABNORMAL HIGH (ref <150)
VLDL: 36 mg/dL (ref 0–40)

## 2024-09-13 LAB — HIV ANTIBODY (ROUTINE TESTING W REFLEX): HIV Screen 4th Generation wRfx: NONREACTIVE

## 2024-09-13 LAB — C-REACTIVE PROTEIN: CRP: 1.1 mg/dL — ABNORMAL HIGH (ref <1.0)

## 2024-09-13 LAB — ECHOCARDIOGRAM COMPLETE BUBBLE STUDY
AR max vel: 2.61 cm2
AV Peak grad: 7.8 mmHg
Ao pk vel: 1.4 m/s
Area-P 1/2: 3.72 cm2
S' Lateral: 2.6 cm

## 2024-09-13 LAB — HEMOGLOBIN A1C
Hgb A1c MFr Bld: 4.6 % — ABNORMAL LOW (ref 4.8–5.6)
Mean Plasma Glucose: 85.32 mg/dL

## 2024-09-13 LAB — SEDIMENTATION RATE: Sed Rate: 6 mm/h (ref 0–30)

## 2024-09-13 MED ORDER — SODIUM CHLORIDE 0.9 % IV SOLN: INTRAVENOUS | Status: DC

## 2024-09-13 MED ORDER — ACETAMINOPHEN 650 MG RE SUPP: 650.0000 mg | RECTAL | Status: DC | PRN

## 2024-09-13 MED ORDER — STROKE: EARLY STAGES OF RECOVERY BOOK: Freq: Once | Status: DC

## 2024-09-13 MED ORDER — ACETAMINOPHEN 325 MG PO TABS: 650.0000 mg | ORAL_TABLET | ORAL | Status: DC | PRN

## 2024-09-13 MED ORDER — ASPIRIN 325 MG PO TABS
325.0000 mg | ORAL_TABLET | Freq: Every day | ORAL | Status: DC
  Administered 2024-09-13: 325 mg via ORAL
  Filled 2024-09-13: qty 1

## 2024-09-13 MED ORDER — ENOXAPARIN SODIUM 40 MG/0.4ML IJ SOSY
40.0000 mg | PREFILLED_SYRINGE | INTRAMUSCULAR | Status: DC
  Filled 2024-09-13: qty 0.4

## 2024-09-13 MED ORDER — SENNOSIDES-DOCUSATE SODIUM 8.6-50 MG PO TABS: 1.0000 | ORAL_TABLET | Freq: Every evening | ORAL | Status: DC | PRN

## 2024-09-13 MED ORDER — GADOBUTROL 1 MMOL/ML IV SOLN
7.0000 mL | Freq: Once | INTRAVENOUS | Status: AC | PRN
Start: 2024-09-13 — End: 2024-09-13
  Administered 2024-09-13: 7 mL via INTRAVENOUS

## 2024-09-13 MED ORDER — ACETAMINOPHEN 160 MG/5ML PO SOLN: 650.0000 mg | ORAL | Status: DC | PRN

## 2024-09-13 MED ORDER — ATORVASTATIN CALCIUM 40 MG PO TABS: 40.0000 mg | ORAL_TABLET | Freq: Every day | ORAL | 1 refills | Status: DC

## 2024-09-13 MED ORDER — ASPIRIN 81 MG PO TBEC: 81.0000 mg | DELAYED_RELEASE_TABLET | Freq: Every day | ORAL | Status: AC

## 2024-09-13 NOTE — Progress Notes (Signed)
 PT Cancellation Note  Patient Details Name: Sheryl Carr MRN: [id number] DOB: 04/01/1971   Cancelled Treatment:    Reason Eval/Treat Not Completed: PT screened, no needs identified, will sign off PT checked in x2. Pt in procedure earlier this am. Upon entering the room x2 pt declined per states that she is at her baseline. PT followed up with OT. Pt was able to walk in hallway and complete steps.    [name] 09/13/2024, 9:59 AM

## 2024-09-13 NOTE — Evaluation (Signed)
 Occupational Therapy Evaluation Patient Details Name: Sheryl Carr MRN: [id number] DOB: 03-Jul-1971 Today's Date: 09/13/2024   History of Present Illness   53 y.o. female with medical history significant of HTN, GERD, anxiety, CKD-2, kidney stone, uterine fibroid, who presents with dizziness, poor balance, difficulty speaking.     Clinical Impressions Upon entering the room, pt supine in bed and agreeable to OT evaluation. Pt reports living at home with husband and 66 y/o daughter. Pt is Ind at baseline and works full time. Pt performs bed mobility without assistance and ambulates 500' without use of AD. No dizziness or LOB noted. Pt demonstrates ability to ambulates up/down flight of steps with 1 rails, similar to home environment, without issue. She returns to room in same manner as above and sits on EOB to eat breakfast. Pt with no acute OT needs at this time. OT to sign off and pt agrees.      If plan is discharge home, recommend the following:         Functional Status Assessment   Patient has not had a recent decline in their functional status     Equipment Recommendations   None recommended by OT      Precautions/Restrictions   Precautions Precautions: None     Mobility Bed Mobility Overal bed mobility: Independent                  Transfers Overall transfer level: Independent                        Balance Overall balance assessment: Independent                                         ADL either performed or assessed with clinical judgement   ADL Overall ADL's : Independent                                             Vision Patient Visual Report: No change from baseline              Pertinent Vitals/Pain Pain Assessment Pain Assessment: Faces Faces Pain Scale: Hurts a little bit Pain Location: low back pain Pain Descriptors / Indicators: Discomfort Pain Intervention(s):  Monitored during session     Extremity/Trunk Assessment Upper Extremity Assessment Upper Extremity Assessment: Overall WFL for tasks assessed   Lower Extremity Assessment Lower Extremity Assessment: Overall WFL for tasks assessed   Cervical / Trunk Assessment Cervical / Trunk Assessment: Normal   Communication Communication Communication: No apparent difficulties   Cognition Arousal: Alert Behavior During Therapy: WFL for tasks assessed/performed Cognition: No apparent impairments                               Following commands: Intact                  Home Living Family/patient expects to be discharged to:: Private residence Living Arrangements: Spouse/significant other;Children Available Help at Discharge: Family;Available PRN/intermittently Type of Home: House Home Access: Stairs to enter [employer] of Steps: 3 Entrance Stairs-Rails: Right;Left Home Layout: Two level;Bed/bath upstairs Alternate Level Stairs-Number of Steps: flight   Bathroom Shower/Tub: Walk-in shower  Home Equipment: None          Prior Functioning/Environment Prior Level of Function : Independent/Modified Independent;Working/employed;Driving                    AM-PAC OT 6 Clicks Daily Activity     Outcome Measure Help from another person eating meals?: None Help from another person taking care of personal grooming?: None Help from another person toileting, which includes using toliet, bedpan, or urinal?: None Help from another person bathing (including washing, rinsing, drying)?: None Help from another person to put on and taking off regular upper body clothing?: None Help from another person to put on and taking off regular lower body clothing?: None 6 Click Score: 24   End of Session Nurse Communication: Mobility status  Activity Tolerance: Patient tolerated treatment well Patient left: in bed;with call bell/phone within reach                    Time: 0921-0937 OT Time Calculation (min): 16 min Charges:  OT General Charges $OT Visit: 1 Visit OT Evaluation $OT Eval Low Complexity: 1 Low OT Treatments $Therapeutic Activity: 8-22 mins  Izetta Claude, MS, OTR/L , CBIS ascom [phone]  09/13/24, 10:34 AM

## 2024-09-13 NOTE — Discharge Summary (Signed)
 Physician Discharge Summary  Sheryl Carr FMW:[id number] DOB: May 27, 1971 DOA: 09/12/2024  PCP: Edman Marsa PARAS, DO  Admit date: 09/12/2024 Discharge date: 09/13/2024  Admitted From: Home Disposition:  Home  Recommendations for Outpatient Follow-up:  Follow up with PCP in 1-2 weeks Consider outpatient referral to neurology  Home Health: No Equipment/Devices: None  Discharge Condition: Stable CODE STATUS: Full Diet recommendation: Regular  Brief/Interim Summary:  53 y.o. female with medical history significant of HTN, GERD, anxiety, CKD-2, kidney stone, uterine fibroid, who presents with dizziness, poor balance, difficulty speaking.   Patient states that she has been feeling fatigued for the whole day.  At about 3 PM, she developed lightheadedness, dizziness with poor balance.  She checked her blood pressure which was 218/180 at home.  Her blood pressure is 182/99 in ED, which improved to 128/81 without treatment. Pt denies unilateral numbness or tingling in extremities.  No facial droop or vision loss.  Per her husband, patient had 2 episodes of difficulty speaking in ED.  Patient states that she had mild chest discomfort earlier which has resolved.  No SOB, cough, fever or chills.  She has some nausea, no vomiting, diarrhea or abdominal pain.  No symptoms of UTI.    Discharge Diagnoses:  Principal Problem:   Stroke-like symptom Active Problems:   Essential hypertension   Acute renal failure superimposed on stage 2 chronic kidney disease   Overweight (BMI 25.0-29.9)     Stroke-like symptom: Etiology is not clear.  CT head negative.  Teleneurology, Dr. Todo was consulted, who recommended stroke workup, including MRI of brain, MRA of head and neck, 2D echo with bubble.  All workup done and reassuring.  Per inpatient neurology suspect hypertensive urgency rather than true TIA but unable to totally exclude.  At time of discharge will recommend aspirin 81 mg daily.   Statin Lipitor 40 mg daily.  A1c at goal.  Seen by therapy.  No follow-up recommended.  Patient may follow-up with her primary care.  Consider outpatient referral to neurology post DC.    Discharge Instructions  Discharge Instructions     Diet - low sodium heart healthy   Complete by: As directed    Increase activity slowly   Complete by: As directed       Allergies as of 09/13/2024       Reactions   Pollen Extract    itchy eyes, dull headaches, runny nose   Amoxicillin Rash        Medication List     STOP taking these medications    diclofenac  Sodium 1 % Gel Commonly known as: Voltaren    econazole nitrate 1 % cream   mupirocin  ointment 2 % Commonly known as: BACTROBAN        TAKE these medications    aspirin EC 81 MG tablet Take 1 tablet (81 mg total) by mouth daily. Swallow whole.   atorvastatin 40 MG tablet Commonly known as: LIPITOR Take 1 tablet (40 mg total) by mouth daily. Start taking on: September 14, 2024   losartan  25 MG tablet Commonly known as: COZAAR  Take 1 tablet (25 mg total) by mouth daily.   ONE-A-DAY WOMENS FORMULA PO Take 1 tablet by mouth daily.        Follow-up Information     Edman Marsa PARAS, DO. Schedule an appointment as soon as possible for a visit in 1 week(s).   Specialty: Family Medicine Contact information: [address] [place] [address] [phone]  Allergies  Allergen Reactions   Pollen Extract     itchy eyes, dull headaches, runny nose   Amoxicillin Rash    Consultations: Neurology   Procedures/Studies: ECHOCARDIOGRAM COMPLETE BUBBLE STUDY Result Date: 09/13/2024    ECHOCARDIOGRAM REPORT   Patient Name:   Sheryl Carr Date of Exam: 09/13/2024 Medical Rec #:  [id number]           Height:       68.0 in Accession #:    [id number]          Weight:       167.0 lb Date of Birth:  [birth year]          BSA:          1.893 m Patient Age:    52 years            BP:            112/68 mmHg Patient Gender: F                   HR:           72 bpm. Exam Location:  [facility] Procedure: 2D Echo, Cardiac Doppler, Color Doppler and Saline Contrast Bubble            Study (Both Spectral and Color Flow Doppler were utilized during            procedure). Indications:     Stroke 434.91/I63.9  History:         Patient has no prior history of Echocardiogram examinations.  Sonographer:     Bernice Rubinstein RDCS Referring Phys:  [id number] XILIN NIU Diagnosing Phys: Cara JONETTA Lovelace MD  Sonographer Comments: Image acquisition challenging due to respiratory motion. IMPRESSIONS  1. Left ventricular ejection fraction, by estimation, is 60 to 65%. The left ventricle has normal function. The left ventricle has no regional wall motion abnormalities. Left ventricular diastolic parameters were normal.  2. Right ventricular systolic function is normal. The right ventricular size is normal.  3. The mitral valve is normal in structure. No evidence of mitral valve regurgitation.  4. The aortic valve is normal in structure. Aortic valve regurgitation is not visualized.  5. Aortic dilatation noted. There is borderline dilatation of the ascending aorta, measuring 3.4 mm. FINDINGS  Left Ventricle: Left ventricular ejection fraction, by estimation, is 60 to 65%. The left ventricle has normal function. The left ventricle has no regional wall motion abnormalities. Strain was performed and the global longitudinal strain is indeterminate. Global longitudinal strain performed but not reported based on interpreter judgement due to suboptimal tracking. The left ventricular internal cavity size was normal in size. There is no left ventricular hypertrophy. Left ventricular diastolic parameters were normal. Right Ventricle: The right ventricular size is normal. No increase in right ventricular wall thickness. Right ventricular systolic function is normal. Left Atrium: Left atrial size was normal in size. Right Atrium: Right atrial size was  normal in size. Pericardium: There is no evidence of pericardial effusion. Mitral Valve: The mitral valve is normal in structure. No evidence of mitral valve regurgitation. Tricuspid Valve: The tricuspid valve is normal in structure. Tricuspid valve regurgitation is not demonstrated. Aortic Valve: The aortic valve is normal in structure. Aortic valve regurgitation is not visualized. Aortic valve peak gradient measures 7.8 mmHg. Pulmonic Valve: The pulmonic valve was normal in structure. Pulmonic valve regurgitation is not visualized. Aorta: The ascending aorta was not well visualized and aortic dilatation noted. There is borderline dilatation  of the ascending aorta, measuring 3.4 mm. IAS/Shunts: No atrial level shunt detected by color flow Doppler. Agitated saline contrast was given intravenously to evaluate for intracardiac shunting. Additional Comments: 3D was performed not requiring image post processing on an independent workstation and was indeterminate.  LEFT VENTRICLE PLAX 2D LVIDd:         4.00 cm   Diastology LVIDs:         2.60 cm   LV e' medial:    9.57 cm/s LV PW:         0.90 cm   LV E/e' medial:  8.5 LV IVS:        0.90 cm   LV e' lateral:   10.90 cm/s LVOT diam:     1.90 cm   LV E/e' lateral: 7.5 LV SV:         70 LV SV Index:   37 LVOT Area:     2.84 cm  RIGHT VENTRICLE RV Basal diam:  3.60 cm RV S prime:     11.80 cm/s TAPSE (M-mode): 2.5 cm LEFT ATRIUM             Index        RIGHT ATRIUM           Index LA diam:        3.90 cm 2.06 cm/m   RA Area:     17.10 cm LA Vol (A2C):   80.8 ml 42.68 ml/m  RA Volume:   48.00 ml  25.36 ml/m LA Vol (A4C):   68.7 ml 36.29 ml/m LA Biplane Vol: 75.7 ml 39.99 ml/m  AORTIC VALVE                 PULMONIC VALVE AV Area (Vmax): 2.61 cm     PV Vmax:        0.88 m/s AV Vmax:        140.00 cm/s  PV Peak grad:   3.1 mmHg AV Peak Grad:   7.8 mmHg     RVOT Peak grad: 2 mmHg LVOT Vmax:      129.00 cm/s LVOT Vmean:     80.400 cm/s LVOT VTI:       0.248 m  AORTA Ao  Root diam: 3.00 cm Ao Asc diam:  3.40 cm MITRAL VALVE               TRICUSPID VALVE MV Area (PHT): 3.72 cm    TR Peak grad:   22.5 mmHg MV Decel Time: 204 msec    TR Vmax:        237.00 cm/s MV E velocity: 81.80 cm/s MV A velocity: 73.40 cm/s  SHUNTS MV E/A ratio:  1.11        Systemic VTI:  0.25 m                            Systemic Diam: 1.90 cm Cara JONETTA Lovelace MD Electronically signed by Cara JONETTA Lovelace MD Signature Date/Time: 09/13/2024/11:15:22 AM    Final    MR ANGIO HEAD WO CONTRAST Result Date: 09/13/2024 EXAM: MRA HEAD WITHOUT CONTRAST MRA NECK WITHOUT AND WITH CONTRAST 09/13/2024 01:05:37 AM TECHNIQUE: MRA of the head was performed without contrast using time-of-flight technique. MRA of the neck was performed without and with contrast. 3D postprocessing with multiplanar reformations and MIPs was performed for better evaluation of the vasculature. Stenosis of the cervical internal carotid arteries is measured using NASCET criteria.  COMPARISON: None available. CLINICAL HISTORY: Neuro deficit, acute, stroke suspected. Patient presents to the ED via [facility] for complaints of feeling light headed and dizziness. FINDINGS: ANTERIOR CIRCULATION: No significant stenosis of the internal carotid arteries. No significant stenosis of the anterior cerebral arteries. No significant stenosis of the middle cerebral arteries. No aneurysm. POSTERIOR CIRCULATION: No significant stenosis of the posterior cerebral arteries. No significant stenosis of the basilar artery. No significant stenosis of the vertebral arteries. No aneurysm. CERVICAL CAROTID ARTERIES: No dissection. No hemodynamically significant stenosis by NASCET criteria. CERVICAL VERTEBRAL ARTERIES: No dissection. No significant stenosis. IMPRESSION: 1. Normal MRA of the head and neck Electronically signed by: Franky Stanford MD 09/13/2024 01:16 AM EDT RP Workstation: [id number]   MR ANGIO NECK W WO CONTRAST Result Date: 09/13/2024 EXAM: MRA HEAD WITHOUT  CONTRAST MRA NECK WITHOUT AND WITH CONTRAST 09/13/2024 01:05:37 AM TECHNIQUE: MRA of the head was performed without contrast using time-of-flight technique. MRA of the neck was performed without and with contrast. 3D postprocessing with multiplanar reformations and MIPs was performed for better evaluation of the vasculature. Stenosis of the cervical internal carotid arteries is measured using NASCET criteria. COMPARISON: None available. CLINICAL HISTORY: Neuro deficit, acute, stroke suspected. Patient presents to the ED via [facility] for complaints of feeling light headed and dizziness. FINDINGS: ANTERIOR CIRCULATION: No significant stenosis of the internal carotid arteries. No significant stenosis of the anterior cerebral arteries. No significant stenosis of the middle cerebral arteries. No aneurysm. POSTERIOR CIRCULATION: No significant stenosis of the posterior cerebral arteries. No significant stenosis of the basilar artery. No significant stenosis of the vertebral arteries. No aneurysm. CERVICAL CAROTID ARTERIES: No dissection. No hemodynamically significant stenosis by NASCET criteria. CERVICAL VERTEBRAL ARTERIES: No dissection. No significant stenosis. IMPRESSION: 1. Normal MRA of the head and neck Electronically signed by: Franky Stanford MD 09/13/2024 01:16 AM EDT RP Workstation: [id number]   MR BRAIN WO CONTRAST Result Date: 09/13/2024 EXAM: MRI BRAIN WITHOUT CONTRAST 09/13/2024 01:05:37 AM TECHNIQUE: Multiplanar multisequence MRI of the head/brain was performed without the administration of intravenous contrast. COMPARISON: None available. CLINICAL HISTORY: Neuro deficit, acute, stroke suspected. Patient presents to the ED via  EMS for complaints of feeling light headed and dizziness. Reports taking Losartan  and has been taking it as prescribed. AAO\T\4, VSS, BP currently 182/99. Reports taking her Losartan  in the morning and says she took it today. 7ml gad. FINDINGS: BRAIN AND VENTRICLES: No  acute infarct. No intracranial hemorrhage. No mass. No midline shift. No hydrocephalus. Minimal nonspecific white matter T2-weighted signal hyperintensities, which may be associated with early chronic small vessel disease or migraine headaches. The sella is unremarkable. Normal flow voids. ORBITS: No acute abnormality. SINUSES AND MASTOIDS: Bilateral maxillary sinus retention cysts. No acute abnormality. BONES AND SOFT TISSUES: Normal marrow signal. No acute soft tissue abnormality. IMPRESSION: 1. Minimal nonspecific white matter T2-weighted signal hyperintensities, possibly related to early chronic small vessel disease or migraine headaches. 2. No acute intracranial abnormality. Electronically signed by: Franky Stanford MD 09/13/2024 01:11 AM EDT RP Workstation: [id number]   CT HEAD CODE STROKE WO CONTRAST Result Date: 09/12/2024 EXAM: CT HEAD WITHOUT CONTRAST 09/12/2024 10:44:51 PM TECHNIQUE: CT of the head was performed without the administration of intravenous contrast. Automated exposure control, iterative reconstruction, and/or weight based adjustment of the mA/kV was utilized to reduce the radiation dose to as low as reasonably achievable. COMPARISON: None available. CLINICAL HISTORY: Neuro deficit, acute, stroke suspected. Code stroke ; Left side weakness; Lkw 1900; Dr viviann; [id number] FINDINGS: BRAIN AND  VENTRICLES: No acute hemorrhage. No evidence of acute infarct. No hydrocephalus. No extra-axial collection. No mass effect or midline shift. ORBITS: No acute abnormality. SINUSES: Left maxillary sinus retention cyst. SOFT TISSUES AND SKULL: No acute soft tissue abnormality. No skull fracture. IMPRESSION: 1. No acute intracranial abnormality related to the suspected stroke. 2. ASPECTS is 10. 3. Findings called to Dr. Viviann at 10:52 PM on 09/12/24. Electronically signed by: Franky Stanford MD 09/12/2024 10:52 PM EDT RP Workstation: [id number]      Subjective: Seen and examined the day of discharge.   Stable no distress.  Appropriate for discharge home.  Discharge Exam: Vitals:   09/13/24 0512 09/13/24 0913  BP: 112/68 (!) 140/87  Pulse: 67 88  Resp: 18 18  Temp: 98.1 F (36.7 C) 97.9 F (36.6 C)  SpO2: 99% 95%   Vitals:   09/12/24 2330 09/13/24 0118 09/13/24 0512 09/13/24 0913  BP: 128/81 (!) 144/84 112/68 (!) 140/87  Pulse: 67 70 67 88  Resp: 13 18 18 18   Temp:  97.7 F (36.5 C) 98.1 F (36.7 C) 97.9 F (36.6 C)  TempSrc:      SpO2: 97% 97% 99% 95%  Weight:      Height:        General: Pt is alert, awake, not in acute distress Cardiovascular: RRR, S1/S2 +, no rubs, no gallops Respiratory: CTA bilaterally, no wheezing, no rhonchi Abdominal: Soft, NT, ND, bowel sounds + Extremities: no edema, no cyanosis    The results of significant diagnostics from this hospitalization (including imaging, microbiology, ancillary and laboratory) are listed below for reference.     Microbiology: No results found for this or any previous visit (from the past 240 hours).   Labs: BNP (last 3 results) No results for input(s): BNP in the last 8760 hours. Basic Metabolic Panel: Recent Labs  Lab 09/12/24 2200  NA 138  K 3.5  CL 103  CO2 24  GLUCOSE 113*  BUN 18  CREATININE 1.36*  CALCIUM 9.3   Liver Function Tests: Recent Labs  Lab 09/12/24 2200  AST 26  ALT 35  ALKPHOS 66  BILITOT 0.8  PROT 7.1  ALBUMIN 4.0   No results for input(s): LIPASE, AMYLASE in the last 168 hours. No results for input(s): AMMONIA in the last 168 hours. CBC: Recent Labs  Lab 09/12/24 2200  WBC 9.1  NEUTROABS 6.3  HGB 13.1  HCT 38.3  MCV 84.9  PLT 271   Cardiac Enzymes: No results for input(s): CKTOTAL, CKMB, CKMBINDEX, TROPONINI in the last 168 hours. BNP: Invalid input(s): POCBNP CBG: No results for input(s): GLUCAP in the last 168 hours. D-Dimer No results for input(s): DDIMER in the last 72 hours. Hgb A1c Recent Labs    09/12/24 2200  HGBA1C  4.6*   Lipid Profile Recent Labs    09/13/24 0202  CHOL 208*  HDL 61  LDLCALC 111*  TRIG 179*  CHOLHDL 3.4   Thyroid  function studies No results for input(s): TSH, T4TOTAL, T3FREE, THYROIDAB in the last 72 hours.  Invalid input(s): FREET3 Anemia work up No results for input(s): VITAMINB12, FOLATE, FERRITIN, TIBC, IRON, RETICCTPCT in the last 72 hours. Urinalysis    Component Value Date/Time   COLORURINE YELLOW 04/11/2022 0837   APPEARANCEUR Cloudy (A) 04/24/2022 0822   LABSPEC 1.016 04/11/2022 0837   PHURINE 6.0 04/11/2022 0837   GLUCOSEU Negative 04/24/2022 0822   HGBUR 3+ (A) 04/11/2022 0837   BILIRUBINUR Negative 07/11/2022 1519   BILIRUBINUR Negative 04/24/2022 9177  KETONESUR NEGATIVE 04/11/2022 0837   PROTEINUR Positive (A) 07/11/2022 1519   PROTEINUR Negative 04/24/2022 0822   PROTEINUR TRACE (A) 04/11/2022 0837   UROBILINOGEN 0.2 07/11/2022 1519   NITRITE Negative 07/11/2022 1519   NITRITE Negative 04/24/2022 0822   NITRITE NEGATIVE 04/11/2022 0837   LEUKOCYTESUR Negative 07/11/2022 1519   LEUKOCYTESUR Negative 04/24/2022 0822   LEUKOCYTESUR 3+ (A) 04/11/2022 0837   Sepsis Labs Recent Labs  Lab 09/12/24 2200  WBC 9.1   Microbiology No results found for this or any previous visit (from the past 240 hours).   Time coordinating discharge: 40 minutes  SIGNED:   Calvin KATHEE Robson, MD  [facility] 09/13/2024, 12:14 PM Pager   If 7PM-7AM, please contact night-coverage

## 2024-09-13 NOTE — ED Notes (Signed)
 Patient changed into gown for admission. Patient refused hospital socks at this time.

## 2024-09-13 NOTE — ED Notes (Signed)
 To MRI

## 2024-09-13 NOTE — Progress Notes (Addendum)
 NEUROLOGY CONSULT FOLLOW UP NOTE   Date of service: September 13, 2024 Patient Name: Sheryl Carr MRN:  [id number] DOB:  1971/11/28  Interval Hx/subjective  Patient seen and examined Evaluated by telestroke overnight for concerns for dizziness and strokelike symptoms Reports feeling much better. There was concern for left leg weakness yesterday for which code stroke was activated-that has resolved-patient did not really feel she was weak on the left side but it was noticed on objective examination.  Vitals   Vitals:   09/12/24 2330 09/13/24 0118 09/13/24 0512 09/13/24 0913  BP: 128/81 (!) 144/84 112/68 (!) 140/87  Pulse: 67 70 67 88  Resp: 13 18 18 18   Temp:  97.7 F (36.5 C) 98.1 F (36.7 C) 97.9 F (36.6 C)  TempSrc:      SpO2: 97% 97% 99% 95%  Weight:      Height:         Body mass index is 25.39 kg/m.  Physical Exam  General: Awake alert in no distress HEENT: Normocephalic/atraumatic Lungs: Clear Neurological exam Awake alert oriented x 3, no dysarthria, no aphasia, pupils equal round react light, extract movements intact, visual fields full, face appears grossly symmetric, facial sensation intact, tongue and palate midline.  There is no drift in any of the 4 extremities.  Sensory examination is unremarkable for any focality.  Coordination examination reveals no dysmetria. NIH stroke scale at this time is 0  Medications  Current Facility-Administered Medications:    [START ON 09/14/2024]  stroke: early stages of recovery book, , Does not apply, Once, Niu, Xilin, MD   0.9 %  sodium chloride  infusion, , Intravenous, Continuous, Niu, Xilin, MD, Last Rate: 75 mL/hr at 09/13/24 0349, Infusion Verify at 09/13/24 0349   acetaminophen  (TYLENOL ) tablet 650 mg, 650 mg, Oral, Q4H PRN **OR** acetaminophen  (TYLENOL ) 160 MG/5ML solution 650 mg, 650 mg, Per Tube, Q4H PRN **OR** acetaminophen  (TYLENOL ) suppository 650 mg, 650 mg, Rectal, Q4H PRN, Niu, Xilin, MD   aspirin tablet  325 mg, 325 mg, Oral, Daily, Niu, Xilin, MD, 325 mg at 09/13/24 9076   atorvastatin (LIPITOR) tablet 40 mg, 40 mg, Oral, Daily, Niu, Xilin, MD, 40 mg at 09/13/24 0923   enoxaparin (LOVENOX) injection 40 mg, 40 mg, Subcutaneous, Q24H, Niu, Xilin, MD   hydrALAZINE (APRESOLINE) injection 5 mg, 5 mg, Intravenous, Q2H PRN, Niu, Xilin, MD   ondansetron  (ZOFRAN ) injection 4 mg, 4 mg, Intravenous, Q8H PRN, Niu, Xilin, MD   senna-docusate (Senokot-S) tablet 1 tablet, 1 tablet, Oral, QHS PRN, Niu, Xilin, MD  Labs and Diagnostic Imaging   CBC:  Recent Labs  Lab 09/12/24 2200  WBC 9.1  NEUTROABS 6.3  HGB 13.1  HCT 38.3  MCV 84.9  PLT 271    Basic Metabolic Panel:  Lab Results  Component Value Date   NA 138 09/12/2024   K 3.5 09/12/2024   CO2 24 09/12/2024   GLUCOSE 113 (H) 09/12/2024   BUN 18 09/12/2024   CREATININE 1.36 (H) 09/12/2024   CALCIUM 9.3 09/12/2024   GFRNONAA 47 (L) 09/12/2024   GFRAA 103 10/19/2020   Lipid Panel:  Lab Results  Component Value Date   LDLCALC 111 (H) 09/13/2024   HgbA1c:  Lab Results  Component Value Date   HGBA1C 4.6 (L) 09/12/2024   Urine Drug Screen:     Component Value Date/Time   LABOPIA NONE DETECTED 09/12/2024 2312   COCAINSCRNUR NONE DETECTED 09/12/2024 2312   LABBENZ NONE DETECTED 09/12/2024 2312   AMPHETMU NONE DETECTED  09/12/2024 2312   THCU NONE DETECTED 09/12/2024 2312   LABBARB NONE DETECTED 09/12/2024 2312    Alcohol Level     Component Value Date/Time   ETH <15 09/12/2024 2200   INR  Lab Results  Component Value Date   INR 1.0 09/12/2024   APTT  Lab Results  Component Value Date   APTT 31 09/12/2024    Imaging personally reviewed CT head code stroke-no acute findings MRI brain without contrast-no evidence of acute stroke.  No significant acute abnormality MR angio head and neck without significant stenosis or vascular abnormalities.  2D echo pending  Assessment   Sheryl Carr is a 53 y.o. female  history of hypertension presenting for evaluation of dizziness and possibly sudden onset left lower extremity weakness which the patient really does not think was present. She was extremely hypertensive on arrival. Head imaging and head and neck vascular imaging is unremarkable for acute process. There is no evidence of stroke on her MRIs. I suspect her current presentation to be more likely related to hypertensive emergency rather than a stroke or a TIA. Irrespective, given the risk factors and question of focality on initial exam, she is getting a full stroke risk factor workup. 2D echo is pending Her A1c is within goal Her LDL is elevated at 111.  My recommendations are below  Impression: Hypertensive emergency Less likely TIA or stroke  Recommendations  Continue frequent neurochecks Telemetry Given no evidence of stroke on the MRI, I would recommend normalizing her blood pressures gradually to normotension as you would with hypertensive urgency or emergency. I would recommend an aspirin daily I would recommend high intensity statin to keep LDL below 70 Therapy assessments Unless her symptoms recur, I do not see a need for follow-up with outpatient neurology but if the patient desires, surely can follow-up in 8 to 12 weeks. Please reach back out if the 2D echocardiogram findings are concerning Plan was discussed with Dr. Jhonny ______________________________________________________________________   Signed, Eligio Lav, MD [facility]   Addendum 2D echocardiogram completed-normal LVEF, normal LV function, normal LA size.  Bubble study negative.  Recommendations as above.  Discussed with Dr. Jhonny

## 2024-09-15 ENCOUNTER — Ambulatory Visit: Admitting: Family Medicine

## 2024-09-15 ENCOUNTER — Ambulatory Visit: Payer: Self-pay | Admitting: Family Medicine

## 2024-09-15 ENCOUNTER — Encounter: Payer: Self-pay | Admitting: Family Medicine

## 2024-09-15 VITALS — BP 122/80 | HR 78 | Ht 68.0 in | Wt 168.1 lb

## 2024-09-15 DIAGNOSIS — I10 Essential (primary) hypertension: Secondary | ICD-10-CM

## 2024-09-15 DIAGNOSIS — E7800 Pure hypercholesterolemia, unspecified: Secondary | ICD-10-CM

## 2024-09-15 DIAGNOSIS — Z23 Encounter for immunization: Secondary | ICD-10-CM | POA: Diagnosis not present

## 2024-09-15 DIAGNOSIS — I161 Hypertensive emergency: Secondary | ICD-10-CM | POA: Diagnosis not present

## 2024-09-15 DIAGNOSIS — G453 Amaurosis fugax: Secondary | ICD-10-CM

## 2024-09-15 DIAGNOSIS — R29818 Other symptoms and signs involving the nervous system: Secondary | ICD-10-CM

## 2024-09-15 LAB — COLOGUARD: COLOGUARD: NEGATIVE

## 2024-09-15 LAB — ANA: Anti Nuclear Antibody (ANA): NEGATIVE

## 2024-09-15 NOTE — Progress Notes (Signed)
 Subjective:    Patient ID: Sheryl Carr, female    DOB: [birth year], 53 y.o.   MRN: [id number]  Sheryl Carr is a 53 y.o. female presenting on 09/15/2024 for Hospitalization Follow-up   HPI  Discussed the use of AI scribe software for clinical note transcription with the patient, who gave verbal consent to proceed.  History of Present Illness   Sheryl Carr is a 53 year old female who presents for a hospital follow-up after a hypertensive crisis.  HOSPITAL FOLLOW-UP VISIT  Hospital/Location: [facility] Date of Admission: 09/12/24 Date of Discharge: 09/13/24 Transitions of care telephone call: Not completed  Reason for Admission: Hypertension Emergency / TIA-like Neurological Symptoms  - Hospital H&P and Discharge Summary have been reviewed - Patient presents today 2 days after recent hospitalization.   L Amaurosis Fugax / Hypertensive crisis and associated symptoms  - Onset Saturday 09/12/24  evening after prolonged physical activity at a festival - Blood pressure measured at 218/180 mmHg, confirmed by multiple readings - EMS called, confirmed High BP, sent to ED via EMS - Symptoms included chest discomfort, feeling 'unsettled,' difficulty falling asleep, and unsteadiness on her feet, and word finding difficulty with thoughts brain fog symptoms noticed by her husband only - In the emergency department, CT scan and MRI performed. CT immediately done with negative result, no evidence of stroke at that time. Later pursued MRI Brain and MRA. MRI showed possible early mild chronic small vessel changes or migraine headache, but no acute findings, MRA neck did not identify any significant carotid stenosis.  In ED her BP improved without treatment. Symptoms resolved and BP normalized. She was started on Aspirin 81mg  and Atorvastatin 40mg . She was discharged to home for outpatient follow-up  - Current antihypertensive regimen includes losartan  25 mg, typically with  well-controlled blood pressure  Neurological symptoms - Difficulty with word finding and speaking during hypertensive episode both at home and also identified same symptom in ED, observed by her husband - Transient vision changes in the left eye a few weeks prior, described as a 'black cloud' in the lower quadrant, lasting approximately one minute and resolved spontaneously  Current medications - Atorvastatin 40 mg daily - Aspirin 81 mg daily - Losartan  25 mg daily   For eye doctor she goes to: [facility]  Additional report Her daughter has similar issue with loss of vision transiently and is awaiting neurology eval MRI   - Today reports overall has done well after discharge. Symptoms have resolved  - New medications on discharge: Aspirin 81 and Atorvastatin 40mg  - Changes to current meds on discharge: None  I have reviewed the discharge medication list, and have reconciled the current and discharge medications today.   Current Outpatient Medications:    aspirin EC 81 MG tablet, Take 1 tablet (81 mg total) by mouth daily. Swallow whole., Disp: , Rfl:    atorvastatin (LIPITOR) 40 MG tablet, Take 1 tablet (40 mg total) by mouth daily., Disp: 30 tablet, Rfl: 1   losartan  (COZAAR ) 25 MG tablet, Take 1 tablet (25 mg total) by mouth daily., Disp: 90 tablet, Rfl: 3   Multiple Vitamins-Calcium (ONE-A-DAY WOMENS FORMULA PO), Take 1 tablet by mouth daily. , Disp: , Rfl:   ------------------------------------------------------------------------- Social History   Tobacco Use   Smoking status: Former    Current packs/day: 0.00    Types: Cigarettes    Quit date: 09/24/1990    Years since quitting: 34.0   Smokeless tobacco: Former  Vaping Use   Vaping status: Never Used  Substance Use Topics   Alcohol use: Yes    Alcohol/week: 2.0 - 3.0 standard drinks of alcohol    Types: 2 - 3 Standard drinks or equivalent per week    Comment: social   Drug use: No    Review of  Systems Per HPI unless specifically indicated above     Objective:    BP 122/80 (BP Location: Left Arm, Patient Position: Sitting, Cuff Size: Normal)   Pulse 78   Ht 5' 8 (1.727 m)   Wt 168 lb 2 oz (76.3 kg)   LMP 08/09/2017 (Exact Date)   SpO2 99%   BMI 25.56 kg/m   Wt Readings from Last 3 Encounters:  09/15/24 168 lb 2 oz (76.3 kg)  09/12/24 167 lb (75.8 kg)  08/04/24 167 lb 12.8 oz (76.1 kg)    Physical Exam Vitals and nursing note reviewed.  Constitutional:      General: She is not in acute distress.    Appearance: She is well-developed. She is not diaphoretic.     Comments: Well-appearing, comfortable, cooperative  HENT:     Head: Normocephalic and atraumatic.  Eyes:     General:        Right eye: No discharge.        Left eye: No discharge.     Extraocular Movements: Extraocular movements intact.     Conjunctiva/sclera: Conjunctivae normal.     Pupils: Pupils are equal, round, and reactive to light.  Neck:     Thyroid : No thyromegaly.  Cardiovascular:     Rate and Rhythm: Normal rate and regular rhythm.     Heart sounds: Normal heart sounds. No murmur heard. Pulmonary:     Effort: Pulmonary effort is normal. No respiratory distress.     Breath sounds: Normal breath sounds. No wheezing or rales.  Musculoskeletal:        General: Normal range of motion.     Cervical back: Normal range of motion and neck supple.  Lymphadenopathy:     Cervical: No cervical adenopathy.  Skin:    General: Skin is warm and dry.     Findings: No erythema or rash.  Neurological:     General: No focal deficit present.     Mental Status: She is alert and oriented to person, place, and time. Mental status is at baseline.     Cranial Nerves: No cranial nerve deficit.     Sensory: No sensory deficit.     Motor: No weakness.  Psychiatric:        Behavior: Behavior normal.     Comments: Well groomed, good eye contact, normal speech and thoughts     I have personally reviewed the  radiology report from 09/12/24 on CT Head.  EXAM: CT HEAD WITHOUT CONTRAST 09/12/2024 10:44:51 PM   TECHNIQUE: CT of the head was performed without the administration of intravenous contrast. Automated exposure control, iterative reconstruction, and/or weight based adjustment of the mA/kV was utilized to reduce the radiation dose to as low as reasonably achievable.   COMPARISON: None available.   CLINICAL HISTORY: Neuro deficit, acute, stroke suspected. Code stroke ; Left side weakness; Lkw 1900; Dr viviann; [id number]   FINDINGS:   BRAIN AND VENTRICLES: No acute hemorrhage. No evidence of acute infarct. No hydrocephalus. No extra-axial collection. No mass effect or midline shift.   ORBITS: No acute abnormality.   SINUSES: Left maxillary sinus retention cyst.   SOFT TISSUES AND SKULL: No  acute soft tissue abnormality. No skull fracture.   IMPRESSION: 1. No acute intracranial abnormality related to the suspected stroke. 2. ASPECTS is 10. 3. Findings called to Dr. Viviann at 10:52 PM on 09/12/24.   Electronically signed by: Franky Stanford MD 09/12/2024 10:52 PM EDT RP Workstation: [id number]  ---------------  I have personally reviewed the radiology report from 09/13/24 on MR Brain MRA.  MR ANGIO HEAD WO CONTRAST Final result 09/13/2024 1:05 AM      EXAM: MRA HEAD WITHOUT CONTRAST MRA NECK WITHOUT AND WITH CONTRAST 09/13/2024 01:05:37 AM  TECHNIQUE: MRA of the head was performed without contrast using time-of-flight technique. MRA of the neck was performed without and with contrast. 3D postprocessing with multiplanar reformations and MIPs was performed for better evaluation of the vasculature. Stenosis of the cervical internal carotid arteries is measured ...     MR ANGIO NECK W WO CONTRAST Final result 09/13/2024 1:05 AM      EXAM: MRA HEAD WITHOUT CONTRAST MRA NECK WITHOUT AND WITH CONTRAST 09/13/2024 01:05:37 AM  TECHNIQUE: MRA of the head was performed  without contrast using time-of-flight technique. MRA of the neck was performed without and with contrast. 3D postprocessing with multiplanar reformations and MIPs was performed for better evaluation of the vasculature. Stenosis of the cervical internal carotid arteries is measured ...   Study Result  Narrative & Impression  EXAM: MRI BRAIN WITHOUT CONTRAST 09/13/2024 01:05:37 AM   TECHNIQUE: Multiplanar multisequence MRI of the head/brain was performed without the administration of intravenous contrast.   COMPARISON: None available.   CLINICAL HISTORY: Neuro deficit, acute, stroke suspected. Patient presents to the ED via [facility] for complaints of feeling light headed and dizziness. Reports taking Losartan  and has been taking it as prescribed. AAO\T\4, VSS, BP currently 182/99. Reports taking her Losartan  in the morning and says she took it today. 7ml gad.   FINDINGS:   BRAIN AND VENTRICLES: No acute infarct. No intracranial hemorrhage. No mass. No midline shift. No hydrocephalus. Minimal nonspecific white matter T2-weighted signal hyperintensities, which may be associated with early chronic small vessel disease or migraine headaches. The sella is unremarkable. Normal flow voids.   ORBITS: No acute abnormality.   SINUSES AND MASTOIDS: Bilateral maxillary sinus retention cysts. No acute abnormality.   BONES AND SOFT TISSUES: Normal marrow signal. No acute soft tissue abnormality.   IMPRESSION: 1. Minimal nonspecific white matter T2-weighted signal hyperintensities, possibly related to early chronic small vessel disease or migraine headaches. 2. No acute intracranial abnormality.   Electronically signed by: Franky Stanford MD 09/13/2024 01:11 AM EDT RP Workstation: [id number]    Results for orders placed or performed during the hospital encounter of 09/12/24  CBC   Collection Time: 09/12/24 10:00 PM  Result Value Ref Range   WBC 9.1 4.0 - 10.5 K/uL   RBC 4.51  3.87 - 5.11 MIL/uL   Hemoglobin 13.1 12.0 - 15.0 g/dL   HCT 61.6 63.9 - 53.9 %   MCV 84.9 80.0 - 100.0 fL   MCH 29.0 26.0 - 34.0 pg   MCHC 34.2 30.0 - 36.0 g/dL   RDW 88.0 88.4 - 84.4 %   Platelets 271 150 - 400 K/uL   nRBC 0.0 0.0 - 0.2 %  Comprehensive metabolic panel with GFR   Collection Time: 09/12/24 10:00 PM  Result Value Ref Range   Sodium 138 135 - 145 mmol/L   Potassium 3.5 3.5 - 5.1 mmol/L   Chloride 103 98 - 111 mmol/L   CO2 24  22 - 32 mmol/L   Glucose, Bld 113 (H) 70 - 99 mg/dL   BUN 18 6 - 20 mg/dL   Creatinine, Ser 8.63 (H) 0.44 - 1.00 mg/dL   Calcium 9.3 8.9 - 89.6 mg/dL   Total Protein 7.1 6.5 - 8.1 g/dL   Albumin 4.0 3.5 - 5.0 g/dL   AST 26 15 - 41 U/L   ALT 35 0 - 44 U/L   Alkaline Phosphatase 66 38 - 126 U/L   Total Bilirubin 0.8 0.0 - 1.2 mg/dL   GFR, Estimated 47 (L) >60 mL/min   Anion gap 11 5 - 15  Ethanol   Collection Time: 09/12/24 10:00 PM  Result Value Ref Range   Alcohol, Ethyl (B) <15 <15 mg/dL  Differential   Collection Time: 09/12/24 10:00 PM  Result Value Ref Range   Neutrophils Relative % 69 %   Neutro Abs 6.3 1.7 - 7.7 K/uL   Lymphocytes Relative 21 %   Lymphs Abs 1.9 0.7 - 4.0 K/uL   Monocytes Relative 8 %   Monocytes Absolute 0.7 0.1 - 1.0 K/uL   Eosinophils Relative 1 %   Eosinophils Absolute 0.1 0.0 - 0.5 K/uL   Basophils Relative 1 %   Basophils Absolute 0.1 0.0 - 0.1 K/uL   Immature Granulocytes 0 %   Abs Immature Granulocytes 0.04 0.00 - 0.07 K/uL  Hemoglobin A1c   Collection Time: 09/12/24 10:00 PM  Result Value Ref Range   Hgb A1c MFr Bld 4.6 (L) 4.8 - 5.6 %   Mean Plasma Glucose 85.32 mg/dL  Protime-INR   Collection Time: 09/12/24 11:06 PM  Result Value Ref Range   Prothrombin Time 13.3 11.4 - 15.2 seconds   INR 1.0 0.8 - 1.2  APTT   Collection Time: 09/12/24 11:06 PM  Result Value Ref Range   aPTT 31 24 - 36 seconds  Urine Drug Screen, Qualitative   Collection Time: 09/12/24 11:12 PM  Result Value Ref Range    Tricyclic, Ur Screen NONE DETECTED NONE DETECTED   Amphetamines, Ur Screen NONE DETECTED NONE DETECTED   MDMA (Ecstasy)Ur Screen NONE DETECTED NONE DETECTED   Cocaine Metabolite,Ur [place] NONE DETECTED NONE DETECTED   Opiate, Ur Screen NONE DETECTED NONE DETECTED   Phencyclidine (PCP) Ur S NONE DETECTED NONE DETECTED   Cannabinoid 50 Ng, Ur [place] NONE DETECTED NONE DETECTED   Barbiturates, Ur Screen NONE DETECTED NONE DETECTED   Benzodiazepine, Ur Scrn NONE DETECTED NONE DETECTED   Methadone Scn, Ur NONE DETECTED NONE DETECTED  HIV Antibody (routine testing w rflx)   Collection Time: 09/13/24  2:02 AM  Result Value Ref Range   HIV Screen 4th Generation wRfx Non Reactive Non Reactive  Lipid panel   Collection Time: 09/13/24  2:02 AM  Result Value Ref Range   Cholesterol 208 (H) 0 - 200 mg/dL   Triglycerides 820 (H) <150 mg/dL   HDL 61 >59 mg/dL   Total CHOL/HDL Ratio 3.4 RATIO   VLDL 36 0 - 40 mg/dL   LDL Cholesterol 888 (H) 0 - 99 mg/dL  ANA   Collection Time: 09/13/24  2:02 AM  Result Value Ref Range   Anti Nuclear Antibody (ANA) Negative Negative  Sedimentation rate   Collection Time: 09/13/24  2:02 AM  Result Value Ref Range   Sed Rate 6 0 - 30 mm/hr  C-reactive protein   Collection Time: 09/13/24  2:02 AM  Result Value Ref Range   CRP 1.1 (H) <1.0 mg/dL  ECHOCARDIOGRAM COMPLETE  BUBBLE STUDY   Collection Time: 09/13/24  8:44 AM  Result Value Ref Range   Ao pk vel 1.40 m/s   AR max vel 2.61 cm2   AV Peak grad 7.8 mmHg   S' Lateral 2.60 cm   Area-P 1/2 3.72 cm2   Est EF 60 - 65%       Assessment & Plan:   Problem List Items Addressed This Visit     Elevated LDL cholesterol level   Essential hypertension   Other Visit Diagnoses       Hypertensive emergency    -  Primary   Relevant Orders   Ambulatory referral to Neurology     Flu vaccine need       Relevant Orders   Flu vaccine trivalent PF, 6mos and older(Flulaval,Afluria,Fluarix,Fluzone) (Completed)      Amaurosis fugax of left eye       Relevant Orders   Ambulatory referral to Neurology   Ambulatory referral to Ophthalmology     Transient neurological symptoms       Relevant Orders   Ambulatory referral to Neurology   Ambulatory referral to Ophthalmology        Hypertensive crisis with transient neurological and visual symptoms Hospital Follow-up Recent hypertensive crisis with transient neurological and visual symptoms. Differential includes TIA versus blood pressure-related symptoms. Imaging showed no acute abnormalities on CT, MR, MRA. No confirmation of CVA or cannot rule out TIA, however ED attributed it to the blood pressure elevation  However given episode of L isolated amaurosis fugax, I am more concerned for possible TIA related etiology, as a precaution.  - Urgent Refer to neurology for TIA or stroke risk evaluation. - Order ophthalmology referral for ocular assessment / amaurosis fugax - Continue atorvastatin 40 mg daily, switch to evening dosing. - Continue aspirin 81 mg daily. - Monitor blood pressure, continue losartan  25 mg daily. Contact provider if BP spikes to 160/100 mmHg or higher.  We discussed pending Neurology eval, we could consider opinion from Neurosurgery in future if indicated, consider [facility] consult if indicated in future.  Essential hypertension Essential hypertension managed with losartan . Recent crisis, currently well-controlled. - Continue losartan  25 mg daily. - Monitor blood pressure. Consider additional losartan  dose if mild elevations occur.  Hyperlipidemia ASCVD prevention in setting of this recent episode Hyperlipidemia managed with atorvastatin to reduce cardiovascular risk. - Continue atorvastatin 40 mg daily, switch to evening dosing. - Monitor for side effects. Adjust dose or switch medication if intolerable.      No orders of the defined types were placed in this encounter.   Follow up plan: Return if  symptoms worsen or fail to improve.   Marsa Officer, DO [facility] 09/15/2024, 1:29 PM

## 2024-09-15 NOTE — Patient Instructions (Addendum)
 Thank you for coming to the office today.  Keep on baby Aspirin 81mg  daily and Atorvastatin 40mg  daily  Switch Atorvastatin to PM, bedtime, caution muscle aches or pains. Can adjust dose or other version if needed.  Keep on Losartan   Monitor BP  If mild elevations, okay to take 2nd dose, if >160+ OR HIGHER we would need to know about it if it is pattern   Referral to Neurology   [facility]   Address: [address] [place], [place] [address] Hours: 8AM-5PM Phone: [phone]  Referral to Ophthamaology.  ---------------------  If it takes a while to get in to see Neurologist we can refer to Neurosurgery.  [facility] at [facility] [address] [place], [place] [address] [phone]   Please schedule a Follow-up Appointment to: Return if symptoms worsen or fail to improve.  If you have any other questions or concerns, please feel free to call the office or send a message through MyChart. You may also schedule an earlier appointment if necessary.  Additionally, you may be receiving a survey about your experience at our office within a few days to 1 week by e-mail or mail. We value your feedback.  Marsa Officer, DO [facility], [place]

## 2024-09-21 ENCOUNTER — Other Ambulatory Visit: Payer: Self-pay | Admitting: Family Medicine

## 2024-09-21 DIAGNOSIS — Z1231 Encounter for screening mammogram for malignant neoplasm of breast: Secondary | ICD-10-CM

## 2024-09-23 ENCOUNTER — Encounter: Payer: Self-pay | Admitting: Diagnostic Neuroimaging

## 2024-09-23 ENCOUNTER — Ambulatory Visit: Admitting: Diagnostic Neuroimaging

## 2024-09-23 VITALS — BP 146/97 | HR 66 | Ht 68.0 in | Wt 168.4 lb

## 2024-09-23 DIAGNOSIS — I161 Hypertensive emergency: Secondary | ICD-10-CM | POA: Diagnosis not present

## 2024-09-23 DIAGNOSIS — H539 Unspecified visual disturbance: Secondary | ICD-10-CM

## 2024-09-23 DIAGNOSIS — G459 Transient cerebral ischemic attack, unspecified: Secondary | ICD-10-CM | POA: Diagnosis not present

## 2024-09-23 NOTE — Progress Notes (Signed)
 [facility]  PATIENT: Sheryl Carr DOB: 1971/03/03  REFERRING CLINICIAN: Edman Blunt * HISTORY FROM: patient  REASON FOR VISIT: new consult   HISTORICAL  CHIEF COMPLAINT:  Chief Complaint  Patient presents with   RM 6     Patient is here alone for stroke-like symptoms - last month had a cloud in her left eye that lasted a 1 1/2 minute and it cleared. Vision is fine now no issues     HISTORY OF PRESENT ILLNESS:   53 year old female here for evaluation of transient visual disturbance.  08/23/2024 patient had transient gray cloud sensation in her left eye lasting 1 minute.  Then this resolved.  She did not seek medical attention for this.  10//25 the patient had different episode of dizziness, systolic BP greater than 200, word finding difficulties and left leg weakness.  She went to the hospital for evaluation.  She was diagnosed with hypertensive emergency, medically stabilized, and discharged home.  Since then patient is doing well.  No recurrent symptoms.   REVIEW OF SYSTEMS: Full 14 system review of systems performed and negative with exception of: as per HPI.  ALLERGIES: Allergies  Allergen Reactions   Pollen Extract     itchy eyes, dull headaches, runny nose   Amoxicillin Rash    HOME MEDICATIONS: Outpatient Medications Prior to Visit  Medication Sig Dispense Refill   aspirin EC 81 MG tablet Take 1 tablet (81 mg total) by mouth daily. Swallow whole.     atorvastatin (LIPITOR) 40 MG tablet Take 1 tablet (40 mg total) by mouth daily. 30 tablet 1   losartan  (COZAAR ) 25 MG tablet Take 1 tablet (25 mg total) by mouth daily. 90 tablet 3   Multiple Vitamins-Calcium (ONE-A-DAY WOMENS FORMULA PO) Take 1 tablet by mouth daily.      No facility-administered medications prior to visit.    PAST MEDICAL HISTORY: Past Medical History:  Diagnosis Date   Anemia    Chronic kidney disease    H/O KIDNEY STONES   Endometrial polyp 2016    hysteroscopically removed   GERD (gastroesophageal reflux disease)    H/O mammogram 05/11/2015   Hematuria    History of kidney stones    History of uterine fibroid    s/p hysterectomy 08/2017   HTN (hypertension)    PT WAS TAKEN OFF HER BP MED IN MAY/JUNE OF 2016 BUT BP IS ELEVATED AGAIN    PAST SURGICAL HISTORY: Past Surgical History:  Procedure Laterality Date   ABDOMINAL HYSTERECTOMY     BREAST CYST ASPIRATION Right 12/24/2019   CESAREAN SECTION     COLONOSCOPY WITH PROPOFOL  N/A 11/02/2019   Procedure: COLONOSCOPY WITH PROPOFOL ;  Surgeon: Janalyn Keene NOVAK, MD;  Location: [facility];  Service: Endoscopy;  Laterality: N/A;   HYSTERECTOMY ABDOMINAL WITH SALPINGECTOMY Bilateral 08/19/2017   Procedure: HYSTERECTOMY ABDOMINAL WITH BILATERAL SALPINGECTOMY;  Surgeon: Connell Davies, MD;  Location: [facility];  Service: Gynecology;  Laterality: Bilateral;   HYSTEROSCOPY WITH D & C N/A 11/14/2015   Procedure: DILATATION AND CURETTAGE /HYSTEROSCOPY;  Surgeon: Davies Connell, MD;  Location: [facility];  Service: Gynecology;  Laterality: N/A;   KIDNEY STONE SURGERY      FAMILY HISTORY: Family History  Problem Relation Age of Onset   Diabetes Mellitus I Mother    Stroke Maternal Grandmother    Diabetes Mellitus I Maternal Grandmother    Prostate cancer Maternal Grandfather    Diabetes Mellitus I Paternal Grandmother    Migraines Other  Breast cancer Neg Hx    Seizures Neg Hx     SOCIAL HISTORY: Social History   Socioeconomic History   Marital status: Married    Spouse name: Cathlyn   Number of children: 2   Years of education: college   Highest education level: Not on file  Occupational History   Occupation: n/a    Employer: [employer]  Tobacco Use   Smoking status: Former    Current packs/day: 0.00    Types: Cigarettes    Quit date: 09/24/1990    Years since quitting: 34.0   Smokeless tobacco: Former  Building services engineer status: Never Used  Substance and Sexual  Activity   Alcohol use: Yes    Alcohol/week: 2.0 - 3.0 standard drinks of alcohol    Types: 2 - 3 Standard drinks or equivalent per week    Comment: social   Drug use: No   Sexual activity: Yes    Birth control/protection: None  Other Topics Concern   Not on file  Social History Narrative   ** Merged History Encounter ** Patient is married Therapist, art) and lives at home with her husband and one child, one child is in college.Patient is working full-time.Patient has a college education.Patient is right-handed.Patient drinks 1-2 cups of coffee daily, soda and tea are occasionally, not daily.      1 cup s of coffee daily, some soda monthly    Social Drivers of Health   Financial Resource Strain: Not on file  Food Insecurity: No Food Insecurity (09/13/2024)   Hunger Vital Sign    Worried About Running Out of Food in the Last Year: Never true    Ran Out of Food in the Last Year: Never true  Transportation Needs: No Transportation Needs (09/13/2024)   PRAPARE - Administrator, Civil Service (Medical): No    Lack of Transportation (Non-Medical): No  Physical Activity: Not on file  Stress: Not on file  Social Connections: Not on file  Intimate Partner Violence: Not At Risk (09/13/2024)   Humiliation, Afraid, Rape, and Kick questionnaire    Fear of Current or Ex-Partner: No    Emotionally Abused: No    Physically Abused: No    Sexually Abused: No     PHYSICAL EXAM  GENERAL EXAM/CONSTITUTIONAL: Vitals:  Vitals:   09/23/24 1102  BP: (!) 146/97  Pulse: 66  Weight: 168 lb 6.4 oz (76.4 kg)  Height: 5' 8 (1.727 m)   Body mass index is 25.61 kg/m. Wt Readings from Last 3 Encounters:  09/23/24 168 lb 6.4 oz (76.4 kg)  09/15/24 168 lb 2 oz (76.3 kg)  09/12/24 167 lb (75.8 kg)   Patient is in no distress; well developed, nourished and groomed; neck is supple  CARDIOVASCULAR: Examination of carotid arteries is normal; no carotid bruits Regular rate and rhythm, no  murmurs Examination of peripheral vascular system by observation and palpation is normal  EYES: Ophthalmoscopic exam of optic discs and posterior segments is normal; no papilledema or hemorrhages No results found.  MUSCULOSKELETAL: Gait, strength, tone, movements noted in Neurologic exam below  NEUROLOGIC: MENTAL STATUS:      No data to display         awake, alert, oriented to person, place and time recent and remote memory intact normal attention and concentration language fluent, comprehension intact, naming intact fund of knowledge appropriate  CRANIAL NERVE:  2nd - no papilledema on fundoscopic exam 2nd, 3rd, 4th, 6th - pupils equal and  reactive to light, visual fields full to confrontation, extraocular muscles intact, no nystagmus 5th - facial sensation symmetric 7th - facial strength symmetric 8th - hearing intact 9th - palate elevates symmetrically, uvula midline 11th - shoulder shrug symmetric 12th - tongue protrusion midline  MOTOR:  normal bulk and tone, full strength in the BUE, BLE  SENSORY:  normal and symmetric to light touch, temperature, vibration  COORDINATION:  finger-nose-finger, fine finger movements normal  REFLEXES:  deep tendon reflexes 1+ and symmetric  GAIT/STATION:  narrow based gait     DIAGNOSTIC DATA (LABS, IMAGING, TESTING) - I reviewed patient records, labs, notes, testing and imaging myself where available.  Lab Results  Component Value Date   WBC 9.1 09/12/2024   HGB 13.1 09/12/2024   HCT 38.3 09/12/2024   MCV 84.9 09/12/2024   PLT 271 09/12/2024      Component Value Date/Time   NA 138 09/12/2024 2200   NA 140 08/08/2015 1604   NA 139 09/07/2013 2310   K 3.5 09/12/2024 2200   K 4.1 09/07/2013 2310   CL 103 09/12/2024 2200   CL 107 09/07/2013 2310   CO2 24 09/12/2024 2200   CO2 26 09/07/2013 2310   GLUCOSE 113 (H) 09/12/2024 2200   GLUCOSE 95 09/07/2013 2310   BUN 18 09/12/2024 2200   BUN 19 09/08/2018 0910    BUN 25 (H) 09/07/2013 2310   CREATININE 1.36 (H) 09/12/2024 2200   CREATININE 0.79 12/18/2023 0842   CALCIUM 9.3 09/12/2024 2200   CALCIUM 9.1 09/07/2013 2310   PROT 7.1 09/12/2024 2200   PROT 6.9 08/08/2015 1604   PROT 7.1 09/07/2013 2310   ALBUMIN 4.0 09/12/2024 2200   ALBUMIN 4.5 08/08/2015 1604   ALBUMIN 3.8 09/07/2013 2310   AST 26 09/12/2024 2200   AST 23 09/07/2013 2310   ALT 35 09/12/2024 2200   ALT 31 09/07/2013 2310   ALKPHOS 66 09/12/2024 2200   ALKPHOS 69 09/07/2013 2310   BILITOT 0.8 09/12/2024 2200   BILITOT 0.4 08/08/2015 1604   BILITOT 0.3 09/07/2013 2310   GFRNONAA 47 (L) 09/12/2024 2200   GFRNONAA 89 10/19/2020 0803   GFRAA 103 10/19/2020 0803   Lab Results  Component Value Date   CHOL 208 (H) 09/13/2024   HDL 61 09/13/2024   LDLCALC 111 (H) 09/13/2024   TRIG 179 (H) 09/13/2024   CHOLHDL 3.4 09/13/2024   Lab Results  Component Value Date   HGBA1C 4.6 (L) 09/12/2024   No results found for: VITAMINB12 Lab Results  Component Value Date   TSH 3.88 12/18/2023     09/13/2024 MRI brain [I reviewed images myself and agree with interpretation. -VRP]  1. Minimal nonspecific white matter T2-weighted signal hyperintensities, possibly related to early chronic small vessel disease or migraine headaches. 2. No acute intracranial abnormality.  09/13/2024 MRA head / neck 1. Normal MRA of the head and neck   09/13/24 TTE  1. Left ventricular ejection fraction, by estimation, is 60 to 65%. The  left ventricle has normal function. The left ventricle has no regional  wall motion abnormalities. Left ventricular diastolic parameters were  normal.   2. Right ventricular systolic function is normal. The right ventricular  size is normal.   3. The mitral valve is normal in structure. No evidence of mitral valve  regurgitation.   4. The aortic valve is normal in structure. Aortic valve regurgitation is  not visualized.   5. Aortic dilatation noted. There is  borderline dilatation of the  ascending aorta, measuring 3.4 mm.     ASSESSMENT AND PLAN  53 y.o. year old female here with:  Dx:  1. TIA (transient ischemic attack)   2. Transient vision disturbance of left eye   3. Hypertensive emergency      PLAN:  TRANSIENT LEFT EYE VISION DISTURBANCE (1 minute; ~08/23/24) - possible TIA (left amaurosis fugax) - continue aspirin 81, statin, BP control - check 30 day cardiac monitor (rule out afib)  HYPERTENSIVE EMERGENCY (09/12/24; dizziness, word finding difficulties, left-sided weakness) - resolved; monitor BP at home  Orders Placed This Encounter  Procedures   Cardiac event monitor   Return for pending if symptoms worsen or fail to improve, pending test results.  I reviewed images, labs, notes, records myself. I summarized findings and reviewed with patient, for this high risk condition (TIA, hypertensive emergency) requiring high complexity decision making.    EDUARD FABIENE HANLON, MD 09/23/2024, 11:23 AM Certified in Neurology, Neurophysiology and Neuroimaging  [facility] [address], [address] [place], [place] [address] [phone]

## 2024-09-23 NOTE — Patient Instructions (Addendum)
  TRANSIENT LEFT EYE VISION DISTURBANCE (1 minute; ~08/23/24) - continue aspirin 81, statin, BP control - check 30 day cardiac monitor (rule out afib)  HYPERTENSIVE EMERGENCY (09/12/24) - resolved

## 2024-11-03 ENCOUNTER — Ambulatory Visit: Attending: Diagnostic Neuroimaging

## 2024-11-03 DIAGNOSIS — G459 Transient cerebral ischemic attack, unspecified: Secondary | ICD-10-CM

## 2024-11-05 DIAGNOSIS — G459 Transient cerebral ischemic attack, unspecified: Secondary | ICD-10-CM

## 2024-11-21 ENCOUNTER — Encounter: Payer: Self-pay | Admitting: Family Medicine

## 2024-11-21 DIAGNOSIS — G453 Amaurosis fugax: Secondary | ICD-10-CM

## 2024-11-21 DIAGNOSIS — E7800 Pure hypercholesterolemia, unspecified: Secondary | ICD-10-CM

## 2024-11-23 ENCOUNTER — Ambulatory Visit
Admission: RE | Admit: 2024-11-23 | Discharge: 2024-11-23 | Disposition: A | Source: Ambulatory Visit | Attending: Family Medicine | Admitting: Family Medicine

## 2024-11-23 ENCOUNTER — Other Ambulatory Visit: Payer: Self-pay

## 2024-11-23 DIAGNOSIS — Z1231 Encounter for screening mammogram for malignant neoplasm of breast: Secondary | ICD-10-CM | POA: Insufficient documentation

## 2024-11-23 MED ORDER — ATORVASTATIN CALCIUM 40 MG PO TABS: 40.0000 mg | ORAL_TABLET | Freq: Every day | ORAL | 3 refills | Status: AC

## 2024-12-17 ENCOUNTER — Other Ambulatory Visit: Payer: Self-pay

## 2024-12-17 DIAGNOSIS — E7800 Pure hypercholesterolemia, unspecified: Secondary | ICD-10-CM

## 2024-12-17 DIAGNOSIS — R7309 Other abnormal glucose: Secondary | ICD-10-CM

## 2024-12-17 DIAGNOSIS — Z0000 Encounter for general adult medical examination without abnormal findings: Secondary | ICD-10-CM

## 2024-12-17 DIAGNOSIS — I10 Essential (primary) hypertension: Secondary | ICD-10-CM

## 2024-12-18 LAB — CBC WITH DIFFERENTIAL/PLATELET
Absolute Lymphocytes: 1239 {cells}/uL (ref 850–3900)
Absolute Monocytes: 357 {cells}/uL (ref 200–950)
Basophils Absolute: 41 {cells}/uL (ref 0–200)
Basophils Relative: 0.8 %
Eosinophils Absolute: 102 {cells}/uL (ref 15–500)
Eosinophils Relative: 2 %
HCT: 41.9 % (ref 35.9–46.0)
Hemoglobin: 13.5 g/dL (ref 11.7–15.5)
MCH: 28.7 pg (ref 27.0–33.0)
MCHC: 32.2 g/dL (ref 31.6–35.4)
MCV: 89 fL (ref 81.4–101.7)
MPV: 10.3 fL (ref 7.5–12.5)
Monocytes Relative: 7 %
Neutro Abs: 3361 {cells}/uL (ref 1500–7800)
Neutrophils Relative %: 65.9 %
Platelets: 234 Thousand/uL (ref 140–400)
RBC: 4.71 Million/uL (ref 3.80–5.10)
RDW: 11.8 % (ref 11.0–15.0)
Total Lymphocyte: 24.3 %
WBC: 5.1 Thousand/uL (ref 3.8–10.8)

## 2024-12-18 LAB — COMPLETE METABOLIC PANEL WITHOUT GFR
AG Ratio: 1.9 (calc) (ref 1.0–2.5)
ALT: 35 U/L — ABNORMAL HIGH (ref 6–29)
AST: 17 U/L (ref 10–35)
Albumin: 4.4 g/dL (ref 3.6–5.1)
Alkaline phosphatase (APISO): 68 U/L (ref 37–153)
BUN: 18 mg/dL (ref 7–25)
CO2: 29 mmol/L (ref 20–32)
Calcium: 9 mg/dL (ref 8.6–10.4)
Chloride: 106 mmol/L (ref 98–110)
Creat: 0.62 mg/dL (ref 0.50–1.03)
Globulin: 2.3 g/dL (ref 1.9–3.7)
Glucose, Bld: 94 mg/dL (ref 65–139)
Potassium: 4.4 mmol/L (ref 3.5–5.3)
Sodium: 140 mmol/L (ref 135–146)
Total Bilirubin: 0.6 mg/dL (ref 0.2–1.2)
Total Protein: 6.7 g/dL (ref 6.1–8.1)

## 2024-12-18 LAB — LIPID PANEL
Cholesterol: 136 mg/dL
HDL: 54 mg/dL
LDL Cholesterol (Calc): 63 mg/dL
Non-HDL Cholesterol (Calc): 82 mg/dL
Total CHOL/HDL Ratio: 2.5 (calc)
Triglycerides: 105 mg/dL

## 2024-12-18 LAB — HEMOGLOBIN A1C
Hgb A1c MFr Bld: 5.1 %
Mean Plasma Glucose: 100 mg/dL
eAG (mmol/L): 5.5 mmol/L

## 2024-12-18 LAB — TSH: TSH: 2.95 m[IU]/L

## 2025-01-01 ENCOUNTER — Encounter: Payer: Self-pay | Admitting: Family Medicine

## 2025-01-01 ENCOUNTER — Ambulatory Visit: Payer: Self-pay | Admitting: Family Medicine

## 2025-01-01 VITALS — BP 118/74 | HR 65 | Ht 68.0 in | Wt 170.1 lb

## 2025-01-01 DIAGNOSIS — E7800 Pure hypercholesterolemia, unspecified: Secondary | ICD-10-CM | POA: Diagnosis not present

## 2025-01-01 DIAGNOSIS — Z0000 Encounter for general adult medical examination without abnormal findings: Secondary | ICD-10-CM | POA: Diagnosis not present

## 2025-01-01 DIAGNOSIS — I10 Essential (primary) hypertension: Secondary | ICD-10-CM

## 2025-01-01 MED ORDER — LOSARTAN POTASSIUM 25 MG PO TABS: 25.0000 mg | ORAL_TABLET | Freq: Every day | ORAL | 3 refills | Status: AC

## 2025-01-01 NOTE — Progress Notes (Signed)
 "  Subjective:    Patient ID: Sheryl Carr, female    DOB: 1971-02-22, 54 y.o.   MRN: [id number]  Sheryl Carr is a 54 y.o. female presenting on 01/01/2025 for Annual Exam   HPI  Discussed the use of AI scribe software for clinical note transcription with the patient, who gave verbal consent to proceed.  History of Present Illness   Sheryl Carr is a 54 year old female who presents for an annual physical exam.   Gynecological history - History of hysterectomy with cervix removal; ovaries retained. - No further pap smear required, given surgically absent cervix  Antiplatelet therapy - Daily low-dose aspirin  therapy. Cardiovascular prevention - Prolonged bleeding time after blood draws, attributed to aspirin  use.  Musculoskeletal symptoms - Occasional foot pain related to a protruding bone on the side of the foot. - Intermittent aching; currently asymptomatic.     HYPERLIPIDEMIA: - Reports no concerns. Last lipid panel LDL down to 63 from prior 879d, controlled  - Currently taking Atorvastatin  40mg  daily in AM, tolerating well without side effects or myalgias - initially had some myalgia but it has resolved   CHRONIC HTN: No new concerns Current Meds - Losartan  25mg  daily Reports good compliance, took meds today. Tolerating well, w/o complaints. Denies CP, dyspnea, HA, edema, dizziness / lightheadedness   Osteoarthritis multiple joints Joint pain episodic, hands wrist mostly History of ganglion cyst has resolved Rarely using topical Diclofenac    PMH Urinary Frequency / Urge / Overactive Bladder    Anxiety Chronic gradual problem that can flare at times. Not interested in rx medication. Asking about self treatment remedy options with CBD.     Health Maintenance:   Colonoscopy completed 11/02/19, Dr Janalyn AGI - x 3 polyps all negative no adenoma, only 1 hyperplastic - Cologuard negative 09/09/24, repeat 3 years or 2028   Last Mammogram  11/23/24 negative.    Cervical Cancer Screening - Not medically indicated, s/p hysterectomy including removal of cervix. Previous GYN Dr Connell no longer at the practice, no recent updated vaginal exam. No new concerns.  Immunization status - Completed influenza and tetanus vaccinations. - Pneumococcal vaccine discussed; not yet received. - No history of shingles vaccination; considering checking varicella antibody levels due to no prior chickenpox infection.     01/01/2025    1:32 PM 09/15/2024    1:24 PM 08/04/2024    1:46 PM  Depression screen PHQ 2/9  Decreased Interest 0 0 0  Down, Depressed, Hopeless 0 1 0  PHQ - 2 Score 0 1 0  Altered sleeping 0 0 0  Tired, decreased energy 0 0 0  Change in appetite 0 0 0  Feeling bad or failure about yourself  0 0 0  Trouble concentrating 1 0 0  Moving slowly or fidgety/restless 0 0 0  Suicidal thoughts 0 0 0  PHQ-9 Score 1 1  0   Difficult doing work/chores Not difficult at all Not difficult at all Not difficult at all     Data saved with a previous flowsheet row definition       01/01/2025    1:32 PM 09/15/2024    1:24 PM 03/18/2024    1:27 PM 12/25/2023    1:57 PM  GAD 7 : Generalized Anxiety Score  Nervous, Anxious, on Edge 0 1  1  1    Control/stop worrying 1 1  1   0   Worry too much - different things 1 0  1  0  Trouble relaxing 0 0  0  0   Restless 0 0  0  0   Easily annoyed or irritable 1 0  1  0   Afraid - awful might happen 0 0  0  0   Total GAD 7 Score 3 2 4 1   Anxiety Difficulty Not difficult at all Not difficult at all Not difficult at all      Data saved with a previous flowsheet row definition     Past Medical History:  Diagnosis Date   Anemia    Chronic kidney disease    H/O KIDNEY STONES   Endometrial polyp 2016   hysteroscopically removed   GERD (gastroesophageal reflux disease)    H/O mammogram 05/11/2015   Hematuria    History of kidney stones    History of uterine fibroid    s/p hysterectomy 08/2017    HTN (hypertension)    PT WAS TAKEN OFF HER BP MED IN MAY/JUNE OF 2016 BUT BP IS ELEVATED AGAIN   Past Surgical History:  Procedure Laterality Date   ABDOMINAL HYSTERECTOMY     BREAST CYST ASPIRATION Right 12/24/2019   CESAREAN SECTION     COLONOSCOPY WITH PROPOFOL  N/A 11/02/2019   Procedure: COLONOSCOPY WITH PROPOFOL ;  Surgeon: Janalyn Keene NOVAK, MD;  Location: [facility];  Service: Endoscopy;  Laterality: N/A;   HYSTERECTOMY ABDOMINAL WITH SALPINGECTOMY Bilateral 08/19/2017   Procedure: HYSTERECTOMY ABDOMINAL WITH BILATERAL SALPINGECTOMY;  Surgeon: Connell Davies, MD;  Location: [facility];  Service: Gynecology;  Laterality: Bilateral;   HYSTEROSCOPY WITH D & C N/A 11/14/2015   Procedure: DILATATION AND CURETTAGE /HYSTEROSCOPY;  Surgeon: Davies Connell, MD;  Location: [facility];  Service: Gynecology;  Laterality: N/A;   KIDNEY STONE SURGERY     Social History   Socioeconomic History   Marital status: Married    Spouse name: Cathlyn   Number of children: 2   Years of education: college   Highest education level: Not on file  Occupational History   Occupation: n/a    Employer: [employer]  Tobacco Use   Smoking status: Former    Current packs/day: 0.00    Types: Cigarettes    Quit date: 09/24/1990    Years since quitting: 34.2   Smokeless tobacco: Former  Building Services Engineer status: Never Used  Substance and Sexual Activity   Alcohol use: Yes    Alcohol/week: 2.0 - 3.0 standard drinks of alcohol    Types: 2 - 3 Standard drinks or equivalent per week    Comment: social   Drug use: No   Sexual activity: Yes    Birth control/protection: None  Other Topics Concern   Not on file  Social History Narrative   ** Merged History Encounter ** Patient is married Therapist, Art) and lives at home with her husband and one child, one child is in college.Patient is working full-time.Patient has a college education.Patient is right-handed.Patient drinks 1-2 cups of coffee daily, soda and tea  are occasionally, not daily.      1 cup s of coffee daily, some soda monthly    Social Drivers of Health   Tobacco Use: Medium Risk (01/01/2025)   Patient History    Smoking Tobacco Use: Former    Smokeless Tobacco Use: Former    Passive Exposure: Not on Actuary Strain: Not on file  Food Insecurity: No Food Insecurity (09/13/2024)   Epic    Worried About Radiation Protection Practitioner of Food in the Last Year: Never true  Ran Out of Food in the Last Year: Never true  Transportation Needs: No Transportation Needs (09/13/2024)   Epic    Lack of Transportation (Medical): No    Lack of Transportation (Non-Medical): No  Physical Activity: Not on file  Stress: Not on file  Social Connections: Not on file  Intimate Partner Violence: Not At Risk (09/13/2024)   Epic    Fear of Current or Ex-Partner: No    Emotionally Abused: No    Physically Abused: No    Sexually Abused: No  Depression (PHQ2-9): Low Risk (01/01/2025)   Depression (PHQ2-9)    PHQ-2 Score: 1  Alcohol Screen: Not on file  Housing: Low Risk (09/13/2024)   Epic    Unable to Pay for Housing in the Last Year: No    Number of Times Moved in the Last Year: 0    Homeless in the Last Year: No  Utilities: Not At Risk (09/13/2024)   Epic    Threatened with loss of utilities: No  Health Literacy: Not on file   Family History  Problem Relation Age of Onset   Diabetes Mellitus I Mother    Stroke Maternal Grandmother    Diabetes Mellitus I Maternal Grandmother    Prostate cancer Maternal Grandfather    Diabetes Mellitus I Paternal Grandmother    Migraines Other    Breast cancer Neg Hx    Seizures Neg Hx    Current Outpatient Medications on File Prior to Visit  Medication Sig   aspirin  EC 81 MG tablet Take 1 tablet (81 mg total) by mouth daily. Swallow whole.   atorvastatin  (LIPITOR) 40 MG tablet Take 1 tablet (40 mg total) by mouth at bedtime.   Multiple Vitamins-Calcium  (ONE-A-DAY WOMENS FORMULA PO) Take 1 tablet by mouth  daily.    No current facility-administered medications on file prior to visit.    Review of Systems  Constitutional:  Negative for activity change, appetite change, chills, diaphoresis, fatigue and fever.  HENT:  Negative for congestion and hearing loss.   Eyes:  Negative for visual disturbance.  Respiratory:  Negative for cough, chest tightness, shortness of breath and wheezing.   Cardiovascular:  Negative for chest pain, palpitations and leg swelling.  Gastrointestinal:  Negative for abdominal pain, constipation, diarrhea, nausea and vomiting.  Genitourinary:  Negative for dysuria, frequency and hematuria.  Musculoskeletal:  Negative for arthralgias and neck pain.  Skin:  Negative for rash.  Neurological:  Negative for dizziness, weakness, light-headedness, numbness and headaches.  Hematological:  Negative for adenopathy.  Psychiatric/Behavioral:  Negative for behavioral problems, dysphoric mood and sleep disturbance.    Per HPI unless specifically indicated above     Objective:    BP 118/74 (BP Location: Left Arm, Patient Position: Sitting, Cuff Size: Normal)   Pulse 65   Ht 5' 8 (1.727 m)   Wt 170 lb 2 oz (77.2 kg)   LMP 08/09/2017   SpO2 98%   BMI 25.87 kg/m   Wt Readings from Last 3 Encounters:  01/01/25 170 lb 2 oz (77.2 kg)  09/23/24 168 lb 6.4 oz (76.4 kg)  09/15/24 168 lb 2 oz (76.3 kg)    Physical Exam Vitals and nursing note reviewed.  Constitutional:      General: She is not in acute distress.    Appearance: She is well-developed. She is not diaphoretic.     Comments: Well-appearing, comfortable, cooperative  HENT:     Head: Normocephalic and atraumatic.     Right Ear: Tympanic membrane,  ear canal and external ear normal. There is no impacted cerumen.     Left Ear: Tympanic membrane, ear canal and external ear normal. There is no impacted cerumen.  Eyes:     General:        Right eye: No discharge.        Left eye: No discharge.     Conjunctiva/sclera:  Conjunctivae normal.     Pupils: Pupils are equal, round, and reactive to light.  Neck:     Thyroid : No thyromegaly.     Vascular: No carotid bruit.  Cardiovascular:     Rate and Rhythm: Normal rate and regular rhythm.     Pulses: Normal pulses.     Heart sounds: Normal heart sounds. No murmur heard. Pulmonary:     Effort: Pulmonary effort is normal. No respiratory distress.     Breath sounds: Normal breath sounds. No wheezing or rales.  Abdominal:     General: Bowel sounds are normal. There is no distension.     Palpations: Abdomen is soft. There is no mass.     Tenderness: There is no abdominal tenderness.  Musculoskeletal:        General: No tenderness. Normal range of motion.     Cervical back: Normal range of motion and neck supple.     Right lower leg: No edema.     Left lower leg: No edema.     Comments: Upper / Lower Extremities: - Normal muscle tone, strength bilateral upper extremities 5/5, lower extremities 5/5  Lymphadenopathy:     Cervical: No cervical adenopathy.  Skin:    General: Skin is warm and dry.     Findings: No erythema or rash.  Neurological:     Mental Status: She is alert and oriented to person, place, and time.     Comments: Distal sensation intact to light touch all extremities  Psychiatric:        Mood and Affect: Mood normal.        Behavior: Behavior normal.        Thought Content: Thought content normal.     Comments: Well groomed, good eye contact, normal speech and thoughts     Results for orders placed or performed in visit on 12/17/24  CBC with Differential/Platelet   Collection Time: 12/17/24  8:26 AM  Result Value Ref Range   WBC 5.1 3.8 - 10.8 Thousand/uL   RBC 4.71 3.80 - 5.10 Million/uL   Hemoglobin 13.5 11.7 - 15.5 g/dL   HCT 58.0 64.0 - 53.9 %   MCV 89.0 81.4 - 101.7 fL   MCH 28.7 27.0 - 33.0 pg   MCHC 32.2 31.6 - 35.4 g/dL   RDW 88.1 88.9 - 84.9 %   Platelets 234 140 - 400 Thousand/uL   MPV 10.3 7.5 - 12.5 fL   Neutro  Abs 3,361 1,500 - 7,800 cells/uL   Absolute Lymphocytes 1,239 850 - 3,900 cells/uL   Absolute Monocytes 357 200 - 950 cells/uL   Eosinophils Absolute 102 15 - 500 cells/uL   Basophils Absolute 41 0 - 200 cells/uL   Neutrophils Relative % 65.9 %   Total Lymphocyte 24.3 %   Monocytes Relative 7.0 %   Eosinophils Relative 2.0 %   Basophils Relative 0.8 %  COMPLETE METABOLIC PANEL WITH GFR   Collection Time: 12/17/24  8:26 AM  Result Value Ref Range   Glucose, Bld 94 65 - 139 mg/dL   BUN 18 7 - 25 mg/dL   Creat 9.37  0.50 - 1.03 mg/dL   BUN/Creatinine Ratio SEE NOTE: 6 - 22 (calc)   Sodium 140 135 - 146 mmol/L   Potassium 4.4 3.5 - 5.3 mmol/L   Chloride 106 98 - 110 mmol/L   CO2 29 20 - 32 mmol/L   Calcium  9.0 8.6 - 10.4 mg/dL   Total Protein 6.7 6.1 - 8.1 g/dL   Albumin 4.4 3.6 - 5.1 g/dL   Globulin 2.3 1.9 - 3.7 g/dL (calc)   AG Ratio 1.9 1.0 - 2.5 (calc)   Total Bilirubin 0.6 0.2 - 1.2 mg/dL   Alkaline phosphatase (APISO) 68 37 - 153 U/L   AST 17 10 - 35 U/L   ALT 35 (H) 6 - 29 U/L  Hemoglobin A1c   Collection Time: 12/17/24  8:26 AM  Result Value Ref Range   Hgb A1c MFr Bld 5.1 <5.7 %   Mean Plasma Glucose 100 mg/dL   eAG (mmol/L) 5.5 mmol/L  Lipid panel   Collection Time: 12/17/24  8:26 AM  Result Value Ref Range   Cholesterol 136 <200 mg/dL   HDL 54 > OR = 50 mg/dL   Triglycerides 894 <849 mg/dL   LDL Cholesterol (Calc) 63 mg/dL (calc)   Total CHOL/HDL Ratio 2.5 <5.0 (calc)   Non-HDL Cholesterol (Calc) 82 <869 mg/dL (calc)  TSH   Collection Time: 12/17/24  8:26 AM  Result Value Ref Range   TSH 2.95 mIU/L      Assessment & Plan:   Problem List Items Addressed This Visit     Elevated LDL cholesterol level   Essential hypertension   Relevant Medications   losartan  (COZAAR ) 25 MG tablet   Other Visit Diagnoses       Annual physical exam    -  Primary        Updated Health Maintenance information Reviewed recent lab results with patient Encouraged  improvement to lifestyle with diet and exercise Goal of weight loss   Annual preventive health evaluation Discussed vaccinations: flu, tetanus, pneumonia, shingles. Recommended pneumonia vaccination for age 58+ due to Prevnar 20 data. Deferred shingles vaccination pending varicella antibody levels. Hepatitis B status uncertain, advised record check or antibody test. Cervical cancer screening not needed post-hysterectomy. Colon cancer screening negative, next due 2028. Mammogram completed, next due 2026. - Consider Prevnar 20 vaccination for pneumonia. - Check varicella antibody levels before shingles vaccination. - Check Hepatitis B vaccination records or consider antibody test. - Continue routine mammogram screening annually. - Continue Cologuard screening every three years.  Essential hypertension Blood pressure controlled on losartan , reading 118/74 mmHg. No adverse effects reported. - Continue losartan  as prescribed. - Refilled losartan  prescription.  Hypercholesterolemia Cholesterol controlled on atorvastatin  40 mg daily. LDL within target range. No side effects reported. - Continue atorvastatin  40 mg daily. - Monitor cholesterol levels regularly.         No orders of the defined types were placed in this encounter.   Meds ordered this encounter  Medications   losartan  (COZAAR ) 25 MG tablet    Sig: Take 1 tablet (25 mg total) by mouth daily.    Dispense:  90 tablet    Refill:  3     Follow up plan: Return for 1 year fasting lab > 1 week later Annual Physical.  12/27/25  Marsa Officer, DO [facility] 01/01/2025, 1:35 PM  "

## 2025-01-01 NOTE — Patient Instructions (Addendum)
 Thank you for coming to the office today.  Prevnar-20 pneumonia-  future recommendation. Shingles vaccine consideration - we should do the antibody test first.  Cholesterol looks great. Keep on the Statin medication and baby Aspirin   Future possibility to scan Coronary Calcium  Score Cardiac CT Scan. This is a screening test for patients aged 54-50+ with cardiovascular risk factors or who are healthy but would be interested in Cardiovascular Screening for heart disease. Even if there is a family history of heart disease, this imaging can be useful. Typically it can be done every 5+ years or at a different timeline we agree on  The scan will look at the chest and mainly focus on the heart and identify early signs of calcium  build up or blockages within the heart arteries. It is not 100% accurate for identifying blockages or heart disease, but it is useful to help us  predict who may have some early changes or be at risk in the future for a heart attack or cardiovascular problem.  The results are reviewed by a Cardiologist and they will document the results. It should become available on MyChart. Typically the results are divided into percentiles based on other patients of the same demographic and age. So it will compare your risk to others similar to you. If you have a higher score >99 or higher percentile >75%tile, it is recommended to consider Statin cholesterol therapy and or referral to Cardiologist. I will try to help explain your results and if we have questions we can contact the Cardiologist.  You will be contacted for scheduling. Usually it is done at any imaging facility through [facility], [facility] or [facility].  The cost is $99 flat fee total and it does not go through insurance, so no authorization is required.   DUE for FASTING BLOOD WORK (no food or drink after midnight before the lab appointment, only water or coffee without cream/sugar on the  morning of)  SCHEDULE Lab Only visit in the morning at the clinic for lab draw in 1 YEAR  - Make sure Lab Only appointment is at about 1 week before your next appointment, so that results will be available  For Lab Results, once available within 2-3 days of blood draw, you can can log in to MyChart online to view your results and a brief explanation. Also, we can discuss results at next follow-up visit.   Please schedule a Follow-up Appointment to: Return for 1 year fasting lab > 1 week later Annual Physical.  If you have any other questions or concerns, please feel free to call the office or send a message through MyChart. You may also schedule an earlier appointment if necessary.  Additionally, you may be receiving a survey about your experience at our office within a few days to 1 week by e-mail or mail. We value your feedback.  Marsa Officer, DO [facility], [place]

## 2025-01-02 ENCOUNTER — Other Ambulatory Visit: Payer: Self-pay | Admitting: Family Medicine

## 2025-01-02 DIAGNOSIS — R7309 Other abnormal glucose: Secondary | ICD-10-CM

## 2025-01-02 DIAGNOSIS — I10 Essential (primary) hypertension: Secondary | ICD-10-CM

## 2025-01-02 DIAGNOSIS — Z0000 Encounter for general adult medical examination without abnormal findings: Secondary | ICD-10-CM

## 2025-01-02 DIAGNOSIS — E7800 Pure hypercholesterolemia, unspecified: Secondary | ICD-10-CM

## 2025-01-05 ENCOUNTER — Ambulatory Visit: Payer: Self-pay | Admitting: Diagnostic Neuroimaging

## 2025-12-27 ENCOUNTER — Other Ambulatory Visit: Payer: Self-pay

## 2026-01-03 ENCOUNTER — Encounter: Payer: Self-pay | Admitting: Family Medicine
